# Patient Record
Sex: Male | Born: 1950
Health system: Southern US, Community
[De-identification: ages and names within clinical notes are randomized; demographics above are authoritative.]

## PROBLEM LIST (undated history)

## (undated) DIAGNOSIS — N400 Enlarged prostate without lower urinary tract symptoms: Secondary | ICD-10-CM

## (undated) DIAGNOSIS — I739 Peripheral vascular disease, unspecified: Secondary | ICD-10-CM

## (undated) DIAGNOSIS — R011 Cardiac murmur, unspecified: Secondary | ICD-10-CM

## (undated) DIAGNOSIS — M1990 Unspecified osteoarthritis, unspecified site: Secondary | ICD-10-CM

## (undated) DIAGNOSIS — K8689 Other specified diseases of pancreas: Secondary | ICD-10-CM

## (undated) DIAGNOSIS — R32 Unspecified urinary incontinence: Secondary | ICD-10-CM

## (undated) DIAGNOSIS — G2401 Drug induced subacute dyskinesia: Secondary | ICD-10-CM

## (undated) DIAGNOSIS — I10 Essential (primary) hypertension: Secondary | ICD-10-CM

## (undated) DIAGNOSIS — J309 Allergic rhinitis, unspecified: Secondary | ICD-10-CM

## (undated) DIAGNOSIS — F419 Anxiety disorder, unspecified: Secondary | ICD-10-CM

## (undated) DIAGNOSIS — E119 Type 2 diabetes mellitus without complications: Secondary | ICD-10-CM

## (undated) DIAGNOSIS — F32A Depression, unspecified: Secondary | ICD-10-CM

## (undated) DIAGNOSIS — E039 Hypothyroidism, unspecified: Secondary | ICD-10-CM

## (undated) DIAGNOSIS — F209 Schizophrenia, unspecified: Secondary | ICD-10-CM

## (undated) DIAGNOSIS — F329 Major depressive disorder, single episode, unspecified: Secondary | ICD-10-CM

## (undated) DIAGNOSIS — E785 Hyperlipidemia, unspecified: Secondary | ICD-10-CM

## (undated) DIAGNOSIS — E079 Disorder of thyroid, unspecified: Secondary | ICD-10-CM

## (undated) DIAGNOSIS — G629 Polyneuropathy, unspecified: Secondary | ICD-10-CM

## (undated) DIAGNOSIS — K5909 Other constipation: Secondary | ICD-10-CM

## (undated) DIAGNOSIS — H269 Unspecified cataract: Secondary | ICD-10-CM

## (undated) HISTORY — DX: Unspecified cataract: H26.9

## (undated) HISTORY — PX: TONSILLECTOMY: SUR1361

## (undated) HISTORY — PX: CATARACT EXTRACTION: SUR2

## (undated) HISTORY — PX: EYE SURGERY: SHX253

## (undated) HISTORY — PX: OTHER SURGICAL HISTORY: SHX169

## (undated) HISTORY — PX: COLONOSCOPY: SHX174

## (undated) HISTORY — DX: Unspecified osteoarthritis, unspecified site: M19.90

## (undated) HISTORY — DX: Hyperlipidemia, unspecified: E78.5

## (undated) HISTORY — DX: Cardiac murmur, unspecified: R01.1

---

## 1997-12-22 ENCOUNTER — Inpatient Hospital Stay (HOSPITAL_COMMUNITY): Admission: AD | Admit: 1997-12-22 | Discharge: 1998-01-01 | Payer: Self-pay | Admitting: *Deleted

## 1998-01-06 ENCOUNTER — Emergency Department (HOSPITAL_COMMUNITY): Admission: EM | Admit: 1998-01-06 | Discharge: 1998-01-06 | Payer: Self-pay | Admitting: *Deleted

## 1998-12-29 ENCOUNTER — Encounter: Admission: RE | Admit: 1998-12-29 | Discharge: 1999-03-29 | Payer: Self-pay | Admitting: Endocrinology

## 2000-05-26 ENCOUNTER — Emergency Department (HOSPITAL_COMMUNITY): Admission: EM | Admit: 2000-05-26 | Discharge: 2000-05-26 | Payer: Self-pay | Admitting: Internal Medicine

## 2005-12-26 ENCOUNTER — Inpatient Hospital Stay (HOSPITAL_COMMUNITY): Admission: AD | Admit: 2005-12-26 | Discharge: 2006-01-02 | Payer: Self-pay | Admitting: Psychiatry

## 2005-12-27 ENCOUNTER — Ambulatory Visit: Payer: Self-pay | Admitting: Psychiatry

## 2006-09-01 ENCOUNTER — Emergency Department (HOSPITAL_COMMUNITY): Admission: EM | Admit: 2006-09-01 | Discharge: 2006-09-01 | Payer: Self-pay | Admitting: Emergency Medicine

## 2009-02-01 ENCOUNTER — Emergency Department (HOSPITAL_COMMUNITY): Admission: EM | Admit: 2009-02-01 | Discharge: 2009-02-01 | Payer: Self-pay | Admitting: Emergency Medicine

## 2009-06-04 ENCOUNTER — Ambulatory Visit (HOSPITAL_COMMUNITY): Admission: RE | Admit: 2009-06-04 | Discharge: 2009-06-04 | Payer: Self-pay | Admitting: Endocrinology

## 2010-09-20 ENCOUNTER — Encounter: Payer: Self-pay | Admitting: Endocrinology

## 2010-12-06 LAB — URINALYSIS, ROUTINE W REFLEX MICROSCOPIC
Bilirubin Urine: NEGATIVE
Glucose, UA: >1000 mg/dL — AB
Hgb urine dipstick: NEGATIVE
Leukocytes, UA: NEGATIVE
Specific Gravity, Urine: 1.04 — ABNORMAL HIGH (ref 1.005–1.030)
Urobilinogen, UA: 1 mg/dL (ref 0.0–1.0)

## 2010-12-06 LAB — DIFFERENTIAL
Basophils Relative: 0 % (ref 0–1)
Eosinophils Absolute: 0 10*3/uL (ref 0.0–0.7)
Eosinophils Relative: 0 % (ref 0–5)
Lymphocytes Relative: 26 % (ref 12–46)
Monocytes Relative: 6 % (ref 3–12)

## 2010-12-06 LAB — CBC
HCT: 40.7 % (ref 39.0–52.0)
Platelets: 249 10*3/uL (ref 150–400)
RBC: 4.47 MIL/uL (ref 4.22–5.81)
RDW: 13.7 % (ref 11.5–15.5)
WBC: 9 10*3/uL (ref 4.0–10.5)

## 2010-12-06 LAB — GLUCOSE, CAPILLARY

## 2010-12-06 LAB — COMPREHENSIVE METABOLIC PANEL
GFR calc Af Amer: >60 mL/min (ref >60)
Sodium: 139 mEq/L (ref 135–145)

## 2011-01-14 NOTE — Discharge Summary (Signed)
Ralph [name], KNOBEL NO.:  [id number]   MEDICAL RECORD NO.:  [id number]          PATIENT TYPE:  IPS   LOCATION:  0401                          FACILITY:  [facility]   PHYSICIAN:  Anselm Jungling, MD  DATE OF BIRTH:  July 28, 1951   DATE OF ADMISSION:  12/26/2005  DATE OF DISCHARGE:  01/02/2006                                 DISCHARGE SUMMARY   IDENTIFYING DATA/REASON FOR ADMISSION:  The patient is a 60 year old single  white male with a history of chronic schizophrenia, paranoid-type.  He lives  in a group home in [place].  He was admitted due to increases in paranoid  thinking, delusionality of being poisoned by his group home kitchen,  agitation, and decreased sleep.  He had been on a regimen of psychotropic  medications including Seroquel, Depakote and Paxil but, for reasons that  were not clear, had decompensated.  Please refer to the admission note for  further details pertaining to the symptoms, circumstances and history that  led to his hospitalization.   MEDICAL/LABORATORY:  The patient was medically and physically assessed by  the psychiatric nurse practitioner.  He came to [place] with a variety of chronic  medical problems including hypothyroidism, hypertension, GERD, and type 2  diabetes mellitus.  He came to [place] on nonpsychotropic medications including  Diltiazem SA 180 mg daily, Januvia 100 mg daily, Synthroid 150 mcg daily,  Aciphex 20 mg daily, Altace 5 mg daily, metformin ER 500 mg daily, Lipitor  20 mg daily, Actos 45 mg daily, Amaryl 4 mg b.i.d.  These medications were  continued during his inpatient stay.  Zocor 40 mg daily was added to his  regimen.  The patient remained medically stable throughout his inpatient  stay.   HOSPITAL COURSE:  The patient was admitted to the adult inpatient  psychiatric service.  He presented as a well-nourished, well-developed adult  male who was reasonably well-groomed.  He was alert, fully oriented, but had  an  extremely guarded and hypervigilant manner.  He was standoffish and  aloof, irritable, and secretive.  He stated repeatedly that he wanted no  contact with any persons outside.  He did give me permission to speak with  his outside psychiatrist, but responded to most questions with I'll tell  you that on a need to know basis.  He would not state why he was admitted.  He indicated that he was upset with his group home and did not want to go  back there.  He did not make any overtly delusional statements early on in  his stay but later acknowledged that he had some concern about healthfulness  of food that was provided to him at his group home.   The patient had come to [place] on a regimen of Seroquel 200 mg q.a.m., 400 mg at  noon and 600 mg q.h.s.  In addition, he had been on Paxil and Depakote ER.   Seroquel was adjusted to a final dosage of 100 mg q.a.m., 200 mg q.p.m., and  400 mg q.h.s.  Depakote ER was increased to 1500 mg q.h.s.  Depakote level  was pending at the time of discharge and this dictation.  He was continued  on Paxil 40 mg daily.   The patient improved gradually over several days.  As he became more  comfortable with the __________, he became more relaxed, pleasant, with  brighter affect, and a more friendly demeanor.  He was still somewhat  reluctant to talk about the reasons that led to his admission, but  eventually did meet with the staff from his group home, who came to visit  him, and later indicated that he was comfortable returning there.  Apparently, the patient is a well-liked resident of that program, and  generally does quite well there, but occasionally, perhaps once or twice per  year, his mood destabilizes, and he becomes more irritable, and somewhat  paranoid.  He usually responds fairly well to brief hospitalization, as was  the case this time.   AFTERCARE:  The patient appeared to be appropriate for discharge on the  eighth hospital day.   DISCHARGE  MEDICATIONS:  He was discharged with the following medication  regimen.  1.  Amaryl 4 mg b.i.d.  2.  Actos 45 mg daily.  3.  Zocor 40 mg daily.  4.  Glucophage 500 mg q.a.m.  5.  Altace 5 mg daily.  6.  Protonix 40 mg daily.  7.  Diltiazem 180 mg daily.  8.  Synthroid 150 mcg daily.  9.  Januvia 100 mg daily.  10. Paxil 40 mg daily.  11. Seroquel 100 mg q.a.m., 200 mg q.p.m., and 400 mg q.h.s.  12. Depakote ER 1500 mg q.h.s.   FOLLOW UP:  He was to follow-up with Dr. Donell Beers, his outpatient  psychiatrist on Jan 20, 2006.   DISCHARGE DIAGNOSES:  AXIS I:  Schizophrenia, chronic paranoid, acute  exacerbation, resolving.  AXIS II:  Deferred.  AXIS III:  History of hypertension, type 2 diabetes mellitus,  gastroesophageal reflux disease, hypothyroidism.  AXIS IV:  Stressors:  Severe.  AXIS V:  GAF on discharge 60.           ______________________________  Anselm Jungling, MD  Electronically Signed     SPB/MEDQ  D:  01/03/2006  T:  01/04/2006  Job:  [id number]

## 2011-09-08 DIAGNOSIS — F205 Residual schizophrenia: Secondary | ICD-10-CM | POA: Diagnosis not present

## 2011-09-12 DIAGNOSIS — E789 Disorder of lipoprotein metabolism, unspecified: Secondary | ICD-10-CM | POA: Diagnosis not present

## 2011-09-12 DIAGNOSIS — E0789 Other specified disorders of thyroid: Secondary | ICD-10-CM | POA: Diagnosis not present

## 2011-09-12 DIAGNOSIS — Z23 Encounter for immunization: Secondary | ICD-10-CM | POA: Diagnosis not present

## 2011-09-12 DIAGNOSIS — E119 Type 2 diabetes mellitus without complications: Secondary | ICD-10-CM | POA: Diagnosis not present

## 2011-11-16 DIAGNOSIS — F205 Residual schizophrenia: Secondary | ICD-10-CM | POA: Diagnosis not present

## 2011-11-28 DIAGNOSIS — F205 Residual schizophrenia: Secondary | ICD-10-CM | POA: Diagnosis not present

## 2011-12-05 DIAGNOSIS — F205 Residual schizophrenia: Secondary | ICD-10-CM | POA: Diagnosis not present

## 2011-12-12 DIAGNOSIS — F205 Residual schizophrenia: Secondary | ICD-10-CM | POA: Diagnosis not present

## 2011-12-19 DIAGNOSIS — F205 Residual schizophrenia: Secondary | ICD-10-CM | POA: Diagnosis not present

## 2012-01-06 DIAGNOSIS — H10029 Other mucopurulent conjunctivitis, unspecified eye: Secondary | ICD-10-CM | POA: Diagnosis not present

## 2012-01-06 DIAGNOSIS — H16219 Exposure keratoconjunctivitis, unspecified eye: Secondary | ICD-10-CM | POA: Diagnosis not present

## 2012-01-09 DIAGNOSIS — H10029 Other mucopurulent conjunctivitis, unspecified eye: Secondary | ICD-10-CM | POA: Diagnosis not present

## 2012-01-09 DIAGNOSIS — H01009 Unspecified blepharitis unspecified eye, unspecified eyelid: Secondary | ICD-10-CM | POA: Diagnosis not present

## 2012-01-09 DIAGNOSIS — H02209 Unspecified lagophthalmos unspecified eye, unspecified eyelid: Secondary | ICD-10-CM | POA: Diagnosis not present

## 2012-01-09 DIAGNOSIS — H179 Unspecified corneal scar and opacity: Secondary | ICD-10-CM | POA: Diagnosis not present

## 2012-01-10 DIAGNOSIS — E789 Disorder of lipoprotein metabolism, unspecified: Secondary | ICD-10-CM | POA: Diagnosis not present

## 2012-01-10 DIAGNOSIS — E119 Type 2 diabetes mellitus without complications: Secondary | ICD-10-CM | POA: Diagnosis not present

## 2012-01-10 DIAGNOSIS — I10 Essential (primary) hypertension: Secondary | ICD-10-CM | POA: Diagnosis not present

## 2012-01-10 DIAGNOSIS — E0789 Other specified disorders of thyroid: Secondary | ICD-10-CM | POA: Diagnosis not present

## 2012-01-12 DIAGNOSIS — H10029 Other mucopurulent conjunctivitis, unspecified eye: Secondary | ICD-10-CM | POA: Diagnosis not present

## 2012-01-12 DIAGNOSIS — H02209 Unspecified lagophthalmos unspecified eye, unspecified eyelid: Secondary | ICD-10-CM | POA: Diagnosis not present

## 2012-01-12 DIAGNOSIS — H179 Unspecified corneal scar and opacity: Secondary | ICD-10-CM | POA: Diagnosis not present

## 2012-01-12 DIAGNOSIS — H16409 Unspecified corneal neovascularization, unspecified eye: Secondary | ICD-10-CM | POA: Diagnosis not present

## 2012-01-16 DIAGNOSIS — F205 Residual schizophrenia: Secondary | ICD-10-CM | POA: Diagnosis not present

## 2012-01-18 DIAGNOSIS — H179 Unspecified corneal scar and opacity: Secondary | ICD-10-CM | POA: Diagnosis not present

## 2012-01-18 DIAGNOSIS — Z961 Presence of intraocular lens: Secondary | ICD-10-CM | POA: Diagnosis not present

## 2012-01-18 DIAGNOSIS — H10029 Other mucopurulent conjunctivitis, unspecified eye: Secondary | ICD-10-CM | POA: Diagnosis not present

## 2012-01-18 DIAGNOSIS — H02209 Unspecified lagophthalmos unspecified eye, unspecified eyelid: Secondary | ICD-10-CM | POA: Diagnosis not present

## 2012-02-13 DIAGNOSIS — F205 Residual schizophrenia: Secondary | ICD-10-CM | POA: Diagnosis not present

## 2012-02-16 DIAGNOSIS — E0789 Other specified disorders of thyroid: Secondary | ICD-10-CM | POA: Diagnosis not present

## 2012-02-16 DIAGNOSIS — F205 Residual schizophrenia: Secondary | ICD-10-CM | POA: Diagnosis not present

## 2012-02-16 DIAGNOSIS — E119 Type 2 diabetes mellitus without complications: Secondary | ICD-10-CM | POA: Diagnosis not present

## 2012-02-16 DIAGNOSIS — E789 Disorder of lipoprotein metabolism, unspecified: Secondary | ICD-10-CM | POA: Diagnosis not present

## 2012-04-03 DIAGNOSIS — Z125 Encounter for screening for malignant neoplasm of prostate: Secondary | ICD-10-CM | POA: Diagnosis not present

## 2012-04-03 DIAGNOSIS — E0789 Other specified disorders of thyroid: Secondary | ICD-10-CM | POA: Diagnosis not present

## 2012-04-03 DIAGNOSIS — E789 Disorder of lipoprotein metabolism, unspecified: Secondary | ICD-10-CM | POA: Diagnosis not present

## 2012-04-03 DIAGNOSIS — E119 Type 2 diabetes mellitus without complications: Secondary | ICD-10-CM | POA: Diagnosis not present

## 2012-04-03 DIAGNOSIS — N390 Urinary tract infection, site not specified: Secondary | ICD-10-CM | POA: Diagnosis not present

## 2012-04-16 DIAGNOSIS — Z0000 Encounter for general adult medical examination without abnormal findings: Secondary | ICD-10-CM | POA: Diagnosis not present

## 2012-04-16 DIAGNOSIS — I700 Atherosclerosis of aorta: Secondary | ICD-10-CM | POA: Diagnosis not present

## 2012-04-16 DIAGNOSIS — E789 Disorder of lipoprotein metabolism, unspecified: Secondary | ICD-10-CM | POA: Diagnosis not present

## 2012-04-16 DIAGNOSIS — I10 Essential (primary) hypertension: Secondary | ICD-10-CM | POA: Diagnosis not present

## 2012-04-16 DIAGNOSIS — R51 Headache: Secondary | ICD-10-CM | POA: Diagnosis not present

## 2012-04-16 DIAGNOSIS — E119 Type 2 diabetes mellitus without complications: Secondary | ICD-10-CM | POA: Diagnosis not present

## 2012-05-22 DIAGNOSIS — H179 Unspecified corneal scar and opacity: Secondary | ICD-10-CM | POA: Diagnosis not present

## 2012-05-22 DIAGNOSIS — H16219 Exposure keratoconjunctivitis, unspecified eye: Secondary | ICD-10-CM | POA: Diagnosis not present

## 2012-05-22 DIAGNOSIS — E0500 Thyrotoxicosis with diffuse goiter without thyrotoxic crisis or storm: Secondary | ICD-10-CM | POA: Diagnosis not present

## 2012-05-22 DIAGNOSIS — H02109 Unspecified ectropion of unspecified eye, unspecified eyelid: Secondary | ICD-10-CM | POA: Diagnosis not present

## 2012-05-22 DIAGNOSIS — H02539 Eyelid retraction unspecified eye, unspecified lid: Secondary | ICD-10-CM | POA: Diagnosis not present

## 2012-07-09 DIAGNOSIS — Z23 Encounter for immunization: Secondary | ICD-10-CM | POA: Diagnosis not present

## 2012-08-10 DIAGNOSIS — E789 Disorder of lipoprotein metabolism, unspecified: Secondary | ICD-10-CM | POA: Diagnosis not present

## 2012-08-10 DIAGNOSIS — E119 Type 2 diabetes mellitus without complications: Secondary | ICD-10-CM | POA: Diagnosis not present

## 2012-08-16 DIAGNOSIS — E119 Type 2 diabetes mellitus without complications: Secondary | ICD-10-CM | POA: Diagnosis not present

## 2012-08-16 DIAGNOSIS — E789 Disorder of lipoprotein metabolism, unspecified: Secondary | ICD-10-CM | POA: Diagnosis not present

## 2012-08-16 DIAGNOSIS — E0789 Other specified disorders of thyroid: Secondary | ICD-10-CM | POA: Diagnosis not present

## 2012-08-16 DIAGNOSIS — I10 Essential (primary) hypertension: Secondary | ICD-10-CM | POA: Diagnosis not present

## 2012-08-24 DIAGNOSIS — F205 Residual schizophrenia: Secondary | ICD-10-CM | POA: Diagnosis not present

## 2012-09-07 DIAGNOSIS — E119 Type 2 diabetes mellitus without complications: Secondary | ICD-10-CM | POA: Diagnosis not present

## 2012-09-17 DIAGNOSIS — E0789 Other specified disorders of thyroid: Secondary | ICD-10-CM | POA: Diagnosis not present

## 2012-09-17 DIAGNOSIS — E119 Type 2 diabetes mellitus without complications: Secondary | ICD-10-CM | POA: Diagnosis not present

## 2012-09-24 DIAGNOSIS — N400 Enlarged prostate without lower urinary tract symptoms: Secondary | ICD-10-CM | POA: Diagnosis not present

## 2012-09-24 DIAGNOSIS — N3941 Urge incontinence: Secondary | ICD-10-CM | POA: Diagnosis not present

## 2012-09-24 DIAGNOSIS — N433 Hydrocele, unspecified: Secondary | ICD-10-CM | POA: Diagnosis not present

## 2012-10-16 DIAGNOSIS — E119 Type 2 diabetes mellitus without complications: Secondary | ICD-10-CM | POA: Diagnosis not present

## 2012-10-23 DIAGNOSIS — E0789 Other specified disorders of thyroid: Secondary | ICD-10-CM | POA: Diagnosis not present

## 2012-10-23 DIAGNOSIS — E119 Type 2 diabetes mellitus without complications: Secondary | ICD-10-CM | POA: Diagnosis not present

## 2012-10-23 DIAGNOSIS — E789 Disorder of lipoprotein metabolism, unspecified: Secondary | ICD-10-CM | POA: Diagnosis not present

## 2012-11-07 DIAGNOSIS — N3941 Urge incontinence: Secondary | ICD-10-CM | POA: Diagnosis not present

## 2012-11-07 DIAGNOSIS — R972 Elevated prostate specific antigen [PSA]: Secondary | ICD-10-CM | POA: Diagnosis not present

## 2012-11-07 DIAGNOSIS — R3915 Urgency of urination: Secondary | ICD-10-CM | POA: Diagnosis not present

## 2012-11-07 DIAGNOSIS — N400 Enlarged prostate without lower urinary tract symptoms: Secondary | ICD-10-CM | POA: Diagnosis not present

## 2012-11-22 DIAGNOSIS — F205 Residual schizophrenia: Secondary | ICD-10-CM | POA: Diagnosis not present

## 2012-11-22 DIAGNOSIS — R141 Gas pain: Secondary | ICD-10-CM | POA: Diagnosis not present

## 2012-11-23 DIAGNOSIS — E119 Type 2 diabetes mellitus without complications: Secondary | ICD-10-CM | POA: Diagnosis not present

## 2012-11-29 DIAGNOSIS — E119 Type 2 diabetes mellitus without complications: Secondary | ICD-10-CM | POA: Diagnosis not present

## 2012-11-29 DIAGNOSIS — E0789 Other specified disorders of thyroid: Secondary | ICD-10-CM | POA: Diagnosis not present

## 2012-11-29 DIAGNOSIS — I10 Essential (primary) hypertension: Secondary | ICD-10-CM | POA: Diagnosis not present

## 2013-01-15 DIAGNOSIS — R143 Flatulence: Secondary | ICD-10-CM | POA: Diagnosis not present

## 2013-01-15 DIAGNOSIS — R141 Gas pain: Secondary | ICD-10-CM | POA: Diagnosis not present

## 2013-01-18 DIAGNOSIS — F209 Schizophrenia, unspecified: Secondary | ICD-10-CM | POA: Diagnosis not present

## 2013-01-28 DIAGNOSIS — F209 Schizophrenia, unspecified: Secondary | ICD-10-CM | POA: Diagnosis not present

## 2013-02-01 DIAGNOSIS — F205 Residual schizophrenia: Secondary | ICD-10-CM | POA: Diagnosis not present

## 2013-02-06 DIAGNOSIS — E119 Type 2 diabetes mellitus without complications: Secondary | ICD-10-CM | POA: Diagnosis not present

## 2013-02-13 DIAGNOSIS — E119 Type 2 diabetes mellitus without complications: Secondary | ICD-10-CM | POA: Diagnosis not present

## 2013-02-13 DIAGNOSIS — E0789 Other specified disorders of thyroid: Secondary | ICD-10-CM | POA: Diagnosis not present

## 2013-02-13 DIAGNOSIS — G589 Mononeuropathy, unspecified: Secondary | ICD-10-CM | POA: Diagnosis not present

## 2013-03-07 DIAGNOSIS — E789 Disorder of lipoprotein metabolism, unspecified: Secondary | ICD-10-CM | POA: Diagnosis not present

## 2013-03-07 DIAGNOSIS — G589 Mononeuropathy, unspecified: Secondary | ICD-10-CM | POA: Diagnosis not present

## 2013-03-07 DIAGNOSIS — E119 Type 2 diabetes mellitus without complications: Secondary | ICD-10-CM | POA: Diagnosis not present

## 2013-03-25 DIAGNOSIS — E119 Type 2 diabetes mellitus without complications: Secondary | ICD-10-CM | POA: Diagnosis not present

## 2013-04-04 DIAGNOSIS — E119 Type 2 diabetes mellitus without complications: Secondary | ICD-10-CM | POA: Diagnosis not present

## 2013-04-04 DIAGNOSIS — E789 Disorder of lipoprotein metabolism, unspecified: Secondary | ICD-10-CM | POA: Diagnosis not present

## 2013-04-04 DIAGNOSIS — G589 Mononeuropathy, unspecified: Secondary | ICD-10-CM | POA: Diagnosis not present

## 2013-04-04 DIAGNOSIS — I10 Essential (primary) hypertension: Secondary | ICD-10-CM | POA: Diagnosis not present

## 2013-04-09 DIAGNOSIS — F205 Residual schizophrenia: Secondary | ICD-10-CM | POA: Diagnosis not present

## 2013-05-17 DIAGNOSIS — Z125 Encounter for screening for malignant neoplasm of prostate: Secondary | ICD-10-CM | POA: Diagnosis not present

## 2013-05-17 DIAGNOSIS — E0789 Other specified disorders of thyroid: Secondary | ICD-10-CM | POA: Diagnosis not present

## 2013-05-17 DIAGNOSIS — E119 Type 2 diabetes mellitus without complications: Secondary | ICD-10-CM | POA: Diagnosis not present

## 2013-05-17 DIAGNOSIS — E789 Disorder of lipoprotein metabolism, unspecified: Secondary | ICD-10-CM | POA: Diagnosis not present

## 2013-05-30 DIAGNOSIS — E0500 Thyrotoxicosis with diffuse goiter without thyrotoxic crisis or storm: Secondary | ICD-10-CM | POA: Diagnosis not present

## 2013-05-30 DIAGNOSIS — G589 Mononeuropathy, unspecified: Secondary | ICD-10-CM | POA: Diagnosis not present

## 2013-05-30 DIAGNOSIS — Z23 Encounter for immunization: Secondary | ICD-10-CM | POA: Diagnosis not present

## 2013-05-30 DIAGNOSIS — E119 Type 2 diabetes mellitus without complications: Secondary | ICD-10-CM | POA: Diagnosis not present

## 2013-06-03 DIAGNOSIS — R972 Elevated prostate specific antigen [PSA]: Secondary | ICD-10-CM | POA: Diagnosis not present

## 2013-06-07 DIAGNOSIS — N400 Enlarged prostate without lower urinary tract symptoms: Secondary | ICD-10-CM | POA: Diagnosis not present

## 2013-07-02 DIAGNOSIS — E119 Type 2 diabetes mellitus without complications: Secondary | ICD-10-CM | POA: Diagnosis not present

## 2013-07-03 DIAGNOSIS — E119 Type 2 diabetes mellitus without complications: Secondary | ICD-10-CM | POA: Diagnosis not present

## 2013-07-04 DIAGNOSIS — E119 Type 2 diabetes mellitus without complications: Secondary | ICD-10-CM | POA: Diagnosis not present

## 2013-07-04 DIAGNOSIS — E789 Disorder of lipoprotein metabolism, unspecified: Secondary | ICD-10-CM | POA: Diagnosis not present

## 2013-07-04 DIAGNOSIS — I10 Essential (primary) hypertension: Secondary | ICD-10-CM | POA: Diagnosis not present

## 2013-07-24 DIAGNOSIS — N059 Unspecified nephritic syndrome with unspecified morphologic changes: Secondary | ICD-10-CM | POA: Diagnosis not present

## 2013-07-24 DIAGNOSIS — E119 Type 2 diabetes mellitus without complications: Secondary | ICD-10-CM | POA: Diagnosis not present

## 2013-08-15 DIAGNOSIS — F205 Residual schizophrenia: Secondary | ICD-10-CM | POA: Diagnosis not present

## 2013-09-04 DIAGNOSIS — I10 Essential (primary) hypertension: Secondary | ICD-10-CM | POA: Diagnosis not present

## 2013-09-04 DIAGNOSIS — E789 Disorder of lipoprotein metabolism, unspecified: Secondary | ICD-10-CM | POA: Diagnosis not present

## 2013-09-04 DIAGNOSIS — E119 Type 2 diabetes mellitus without complications: Secondary | ICD-10-CM | POA: Diagnosis not present

## 2013-11-06 DIAGNOSIS — E789 Disorder of lipoprotein metabolism, unspecified: Secondary | ICD-10-CM | POA: Diagnosis not present

## 2013-11-06 DIAGNOSIS — E119 Type 2 diabetes mellitus without complications: Secondary | ICD-10-CM | POA: Diagnosis not present

## 2013-11-21 DIAGNOSIS — E119 Type 2 diabetes mellitus without complications: Secondary | ICD-10-CM | POA: Diagnosis not present

## 2013-11-21 DIAGNOSIS — E789 Disorder of lipoprotein metabolism, unspecified: Secondary | ICD-10-CM | POA: Diagnosis not present

## 2013-11-21 DIAGNOSIS — E0789 Other specified disorders of thyroid: Secondary | ICD-10-CM | POA: Diagnosis not present

## 2013-11-21 DIAGNOSIS — N059 Unspecified nephritic syndrome with unspecified morphologic changes: Secondary | ICD-10-CM | POA: Diagnosis not present

## 2013-11-22 DIAGNOSIS — F205 Residual schizophrenia: Secondary | ICD-10-CM | POA: Diagnosis not present

## 2013-12-11 DIAGNOSIS — F205 Residual schizophrenia: Secondary | ICD-10-CM | POA: Diagnosis not present

## 2014-02-20 DIAGNOSIS — E119 Type 2 diabetes mellitus without complications: Secondary | ICD-10-CM | POA: Diagnosis not present

## 2014-02-20 DIAGNOSIS — E0789 Other specified disorders of thyroid: Secondary | ICD-10-CM | POA: Diagnosis not present

## 2014-02-20 DIAGNOSIS — I10 Essential (primary) hypertension: Secondary | ICD-10-CM | POA: Diagnosis not present

## 2014-03-13 DIAGNOSIS — F205 Residual schizophrenia: Secondary | ICD-10-CM | POA: Diagnosis not present

## 2014-06-13 DIAGNOSIS — E119 Type 2 diabetes mellitus without complications: Secondary | ICD-10-CM | POA: Diagnosis not present

## 2014-06-26 DIAGNOSIS — E789 Disorder of lipoprotein metabolism, unspecified: Secondary | ICD-10-CM | POA: Diagnosis not present

## 2014-06-26 DIAGNOSIS — Z23 Encounter for immunization: Secondary | ICD-10-CM | POA: Diagnosis not present

## 2014-06-26 DIAGNOSIS — E032 Hypothyroidism due to medicaments and other exogenous substances: Secondary | ICD-10-CM | POA: Diagnosis not present

## 2014-06-26 DIAGNOSIS — E119 Type 2 diabetes mellitus without complications: Secondary | ICD-10-CM | POA: Diagnosis not present

## 2014-07-21 DIAGNOSIS — Z0000 Encounter for general adult medical examination without abnormal findings: Secondary | ICD-10-CM | POA: Diagnosis not present

## 2014-09-17 DIAGNOSIS — E0789 Other specified disorders of thyroid: Secondary | ICD-10-CM | POA: Diagnosis not present

## 2014-09-17 DIAGNOSIS — Z79899 Other long term (current) drug therapy: Secondary | ICD-10-CM | POA: Diagnosis not present

## 2014-09-17 DIAGNOSIS — E789 Disorder of lipoprotein metabolism, unspecified: Secondary | ICD-10-CM | POA: Diagnosis not present

## 2014-09-17 DIAGNOSIS — Z125 Encounter for screening for malignant neoplasm of prostate: Secondary | ICD-10-CM | POA: Diagnosis not present

## 2014-09-17 DIAGNOSIS — E118 Type 2 diabetes mellitus with unspecified complications: Secondary | ICD-10-CM | POA: Diagnosis not present

## 2014-10-01 DIAGNOSIS — E118 Type 2 diabetes mellitus with unspecified complications: Secondary | ICD-10-CM | POA: Diagnosis not present

## 2014-10-01 DIAGNOSIS — E0500 Thyrotoxicosis with diffuse goiter without thyrotoxic crisis or storm: Secondary | ICD-10-CM | POA: Diagnosis not present

## 2014-10-01 DIAGNOSIS — N400 Enlarged prostate without lower urinary tract symptoms: Secondary | ICD-10-CM | POA: Diagnosis not present

## 2014-11-04 DIAGNOSIS — Z961 Presence of intraocular lens: Secondary | ICD-10-CM | POA: Diagnosis not present

## 2014-11-04 DIAGNOSIS — H04123 Dry eye syndrome of bilateral lacrimal glands: Secondary | ICD-10-CM | POA: Diagnosis not present

## 2014-11-04 DIAGNOSIS — E119 Type 2 diabetes mellitus without complications: Secondary | ICD-10-CM | POA: Diagnosis not present

## 2014-11-04 DIAGNOSIS — H26492 Other secondary cataract, left eye: Secondary | ICD-10-CM | POA: Diagnosis not present

## 2014-11-04 DIAGNOSIS — H17823 Peripheral opacity of cornea, bilateral: Secondary | ICD-10-CM | POA: Diagnosis not present

## 2014-11-04 DIAGNOSIS — H0589 Other disorders of orbit: Secondary | ICD-10-CM | POA: Diagnosis not present

## 2014-11-05 DIAGNOSIS — N433 Hydrocele, unspecified: Secondary | ICD-10-CM | POA: Diagnosis not present

## 2014-11-05 DIAGNOSIS — R972 Elevated prostate specific antigen [PSA]: Secondary | ICD-10-CM | POA: Diagnosis not present

## 2014-11-17 DIAGNOSIS — F205 Residual schizophrenia: Secondary | ICD-10-CM | POA: Diagnosis not present

## 2014-11-18 DIAGNOSIS — Z961 Presence of intraocular lens: Secondary | ICD-10-CM | POA: Diagnosis not present

## 2014-11-18 DIAGNOSIS — H26492 Other secondary cataract, left eye: Secondary | ICD-10-CM | POA: Diagnosis not present

## 2014-12-15 DIAGNOSIS — I10 Essential (primary) hypertension: Secondary | ICD-10-CM | POA: Diagnosis not present

## 2014-12-15 DIAGNOSIS — G40909 Epilepsy, unspecified, not intractable, without status epilepticus: Secondary | ICD-10-CM | POA: Diagnosis not present

## 2014-12-15 DIAGNOSIS — N183 Chronic kidney disease, stage 3 (moderate): Secondary | ICD-10-CM | POA: Diagnosis not present

## 2014-12-24 DIAGNOSIS — E118 Type 2 diabetes mellitus with unspecified complications: Secondary | ICD-10-CM | POA: Diagnosis not present

## 2014-12-24 DIAGNOSIS — Z79899 Other long term (current) drug therapy: Secondary | ICD-10-CM | POA: Diagnosis not present

## 2014-12-24 DIAGNOSIS — E789 Disorder of lipoprotein metabolism, unspecified: Secondary | ICD-10-CM | POA: Diagnosis not present

## 2014-12-24 DIAGNOSIS — E0789 Other specified disorders of thyroid: Secondary | ICD-10-CM | POA: Diagnosis not present

## 2015-01-19 DIAGNOSIS — F205 Residual schizophrenia: Secondary | ICD-10-CM | POA: Diagnosis not present

## 2015-03-17 DIAGNOSIS — R972 Elevated prostate specific antigen [PSA]: Secondary | ICD-10-CM | POA: Diagnosis not present

## 2015-03-30 DIAGNOSIS — N138 Other obstructive and reflux uropathy: Secondary | ICD-10-CM | POA: Diagnosis not present

## 2015-03-30 DIAGNOSIS — R972 Elevated prostate specific antigen [PSA]: Secondary | ICD-10-CM | POA: Diagnosis not present

## 2015-03-30 DIAGNOSIS — N433 Hydrocele, unspecified: Secondary | ICD-10-CM | POA: Diagnosis not present

## 2015-03-30 DIAGNOSIS — R339 Retention of urine, unspecified: Secondary | ICD-10-CM | POA: Diagnosis not present

## 2015-03-30 DIAGNOSIS — N3941 Urge incontinence: Secondary | ICD-10-CM | POA: Diagnosis not present

## 2015-04-01 DIAGNOSIS — E118 Type 2 diabetes mellitus with unspecified complications: Secondary | ICD-10-CM | POA: Diagnosis not present

## 2015-04-08 DIAGNOSIS — E789 Disorder of lipoprotein metabolism, unspecified: Secondary | ICD-10-CM | POA: Diagnosis not present

## 2015-04-08 DIAGNOSIS — E0500 Thyrotoxicosis with diffuse goiter without thyrotoxic crisis or storm: Secondary | ICD-10-CM | POA: Diagnosis not present

## 2015-04-08 DIAGNOSIS — E118 Type 2 diabetes mellitus with unspecified complications: Secondary | ICD-10-CM | POA: Diagnosis not present

## 2015-06-18 DIAGNOSIS — F205 Residual schizophrenia: Secondary | ICD-10-CM | POA: Diagnosis not present

## 2015-06-24 DIAGNOSIS — R972 Elevated prostate specific antigen [PSA]: Secondary | ICD-10-CM | POA: Diagnosis not present

## 2015-06-26 DIAGNOSIS — N183 Chronic kidney disease, stage 3 (moderate): Secondary | ICD-10-CM | POA: Diagnosis not present

## 2015-06-26 DIAGNOSIS — G40909 Epilepsy, unspecified, not intractable, without status epilepticus: Secondary | ICD-10-CM | POA: Diagnosis not present

## 2015-07-01 DIAGNOSIS — N401 Enlarged prostate with lower urinary tract symptoms: Secondary | ICD-10-CM | POA: Diagnosis not present

## 2015-07-01 DIAGNOSIS — R972 Elevated prostate specific antigen [PSA]: Secondary | ICD-10-CM | POA: Diagnosis not present

## 2015-07-01 DIAGNOSIS — N433 Hydrocele, unspecified: Secondary | ICD-10-CM | POA: Diagnosis not present

## 2015-07-01 DIAGNOSIS — N138 Other obstructive and reflux uropathy: Secondary | ICD-10-CM | POA: Diagnosis not present

## 2015-07-02 DIAGNOSIS — F200 Paranoid schizophrenia: Secondary | ICD-10-CM | POA: Diagnosis not present

## 2015-07-02 DIAGNOSIS — F329 Major depressive disorder, single episode, unspecified: Secondary | ICD-10-CM | POA: Diagnosis not present

## 2015-07-15 DIAGNOSIS — F200 Paranoid schizophrenia: Secondary | ICD-10-CM | POA: Diagnosis not present

## 2015-07-15 DIAGNOSIS — F329 Major depressive disorder, single episode, unspecified: Secondary | ICD-10-CM | POA: Diagnosis not present

## 2015-07-17 DIAGNOSIS — Z23 Encounter for immunization: Secondary | ICD-10-CM | POA: Diagnosis not present

## 2015-07-29 DIAGNOSIS — F329 Major depressive disorder, single episode, unspecified: Secondary | ICD-10-CM | POA: Diagnosis not present

## 2015-07-29 DIAGNOSIS — F200 Paranoid schizophrenia: Secondary | ICD-10-CM | POA: Diagnosis not present

## 2015-08-03 DIAGNOSIS — R972 Elevated prostate specific antigen [PSA]: Secondary | ICD-10-CM | POA: Diagnosis not present

## 2015-08-03 DIAGNOSIS — N433 Hydrocele, unspecified: Secondary | ICD-10-CM | POA: Diagnosis not present

## 2015-08-03 DIAGNOSIS — N138 Other obstructive and reflux uropathy: Secondary | ICD-10-CM | POA: Diagnosis not present

## 2015-08-03 DIAGNOSIS — N281 Cyst of kidney, acquired: Secondary | ICD-10-CM | POA: Diagnosis not present

## 2015-08-03 DIAGNOSIS — N401 Enlarged prostate with lower urinary tract symptoms: Secondary | ICD-10-CM | POA: Diagnosis not present

## 2015-08-07 DIAGNOSIS — F329 Major depressive disorder, single episode, unspecified: Secondary | ICD-10-CM | POA: Diagnosis not present

## 2015-08-07 DIAGNOSIS — F200 Paranoid schizophrenia: Secondary | ICD-10-CM | POA: Diagnosis not present

## 2015-08-11 DIAGNOSIS — F200 Paranoid schizophrenia: Secondary | ICD-10-CM | POA: Diagnosis not present

## 2015-08-11 DIAGNOSIS — F329 Major depressive disorder, single episode, unspecified: Secondary | ICD-10-CM | POA: Diagnosis not present

## 2015-08-27 DIAGNOSIS — F329 Major depressive disorder, single episode, unspecified: Secondary | ICD-10-CM | POA: Diagnosis not present

## 2015-08-27 DIAGNOSIS — M79675 Pain in left toe(s): Secondary | ICD-10-CM | POA: Diagnosis not present

## 2015-08-27 DIAGNOSIS — B351 Tinea unguium: Secondary | ICD-10-CM | POA: Diagnosis not present

## 2015-08-27 DIAGNOSIS — M79674 Pain in right toe(s): Secondary | ICD-10-CM | POA: Diagnosis not present

## 2015-08-27 DIAGNOSIS — F200 Paranoid schizophrenia: Secondary | ICD-10-CM | POA: Diagnosis not present

## 2015-09-09 DIAGNOSIS — J329 Chronic sinusitis, unspecified: Secondary | ICD-10-CM | POA: Diagnosis not present

## 2015-09-09 DIAGNOSIS — E118 Type 2 diabetes mellitus with unspecified complications: Secondary | ICD-10-CM | POA: Diagnosis not present

## 2015-09-16 DIAGNOSIS — F200 Paranoid schizophrenia: Secondary | ICD-10-CM | POA: Diagnosis not present

## 2015-09-16 DIAGNOSIS — F329 Major depressive disorder, single episode, unspecified: Secondary | ICD-10-CM | POA: Diagnosis not present

## 2015-09-17 DIAGNOSIS — F205 Residual schizophrenia: Secondary | ICD-10-CM | POA: Diagnosis not present

## 2015-09-21 DIAGNOSIS — G40909 Epilepsy, unspecified, not intractable, without status epilepticus: Secondary | ICD-10-CM | POA: Diagnosis not present

## 2015-09-21 DIAGNOSIS — N183 Chronic kidney disease, stage 3 (moderate): Secondary | ICD-10-CM | POA: Diagnosis not present

## 2015-09-23 DIAGNOSIS — F329 Major depressive disorder, single episode, unspecified: Secondary | ICD-10-CM | POA: Diagnosis not present

## 2015-09-23 DIAGNOSIS — F200 Paranoid schizophrenia: Secondary | ICD-10-CM | POA: Diagnosis not present

## 2015-10-02 DIAGNOSIS — E119 Type 2 diabetes mellitus without complications: Secondary | ICD-10-CM | POA: Diagnosis not present

## 2015-10-02 DIAGNOSIS — Z125 Encounter for screening for malignant neoplasm of prostate: Secondary | ICD-10-CM | POA: Diagnosis not present

## 2015-10-02 DIAGNOSIS — E0789 Other specified disorders of thyroid: Secondary | ICD-10-CM | POA: Diagnosis not present

## 2015-10-02 DIAGNOSIS — E118 Type 2 diabetes mellitus with unspecified complications: Secondary | ICD-10-CM | POA: Diagnosis not present

## 2015-10-06 DIAGNOSIS — F200 Paranoid schizophrenia: Secondary | ICD-10-CM | POA: Diagnosis not present

## 2015-10-06 DIAGNOSIS — F329 Major depressive disorder, single episode, unspecified: Secondary | ICD-10-CM | POA: Diagnosis not present

## 2015-10-14 DIAGNOSIS — F200 Paranoid schizophrenia: Secondary | ICD-10-CM | POA: Diagnosis not present

## 2015-10-14 DIAGNOSIS — F329 Major depressive disorder, single episode, unspecified: Secondary | ICD-10-CM | POA: Diagnosis not present

## 2015-10-22 DIAGNOSIS — E039 Hypothyroidism, unspecified: Secondary | ICD-10-CM | POA: Diagnosis not present

## 2015-10-22 DIAGNOSIS — E032 Hypothyroidism due to medicaments and other exogenous substances: Secondary | ICD-10-CM | POA: Diagnosis not present

## 2015-10-22 DIAGNOSIS — E118 Type 2 diabetes mellitus with unspecified complications: Secondary | ICD-10-CM | POA: Diagnosis not present

## 2015-10-22 DIAGNOSIS — N400 Enlarged prostate without lower urinary tract symptoms: Secondary | ICD-10-CM | POA: Diagnosis not present

## 2015-10-28 DIAGNOSIS — F200 Paranoid schizophrenia: Secondary | ICD-10-CM | POA: Diagnosis not present

## 2015-10-28 DIAGNOSIS — F329 Major depressive disorder, single episode, unspecified: Secondary | ICD-10-CM | POA: Diagnosis not present

## 2015-11-02 DIAGNOSIS — R05 Cough: Secondary | ICD-10-CM | POA: Diagnosis not present

## 2015-11-18 DIAGNOSIS — F200 Paranoid schizophrenia: Secondary | ICD-10-CM | POA: Diagnosis not present

## 2015-11-18 DIAGNOSIS — F329 Major depressive disorder, single episode, unspecified: Secondary | ICD-10-CM | POA: Diagnosis not present

## 2015-12-01 DIAGNOSIS — E119 Type 2 diabetes mellitus without complications: Secondary | ICD-10-CM | POA: Diagnosis not present

## 2015-12-01 DIAGNOSIS — E0500 Thyrotoxicosis with diffuse goiter without thyrotoxic crisis or storm: Secondary | ICD-10-CM | POA: Diagnosis not present

## 2015-12-01 DIAGNOSIS — H04123 Dry eye syndrome of bilateral lacrimal glands: Secondary | ICD-10-CM | POA: Diagnosis not present

## 2015-12-01 DIAGNOSIS — H17823 Peripheral opacity of cornea, bilateral: Secondary | ICD-10-CM | POA: Diagnosis not present

## 2015-12-01 DIAGNOSIS — Z961 Presence of intraocular lens: Secondary | ICD-10-CM | POA: Diagnosis not present

## 2015-12-02 DIAGNOSIS — F329 Major depressive disorder, single episode, unspecified: Secondary | ICD-10-CM | POA: Diagnosis not present

## 2015-12-02 DIAGNOSIS — F200 Paranoid schizophrenia: Secondary | ICD-10-CM | POA: Diagnosis not present

## 2015-12-16 DIAGNOSIS — M542 Cervicalgia: Secondary | ICD-10-CM | POA: Diagnosis not present

## 2015-12-16 DIAGNOSIS — J329 Chronic sinusitis, unspecified: Secondary | ICD-10-CM | POA: Diagnosis not present

## 2015-12-16 DIAGNOSIS — E118 Type 2 diabetes mellitus with unspecified complications: Secondary | ICD-10-CM | POA: Diagnosis not present

## 2015-12-16 DIAGNOSIS — M5030 Other cervical disc degeneration, unspecified cervical region: Secondary | ICD-10-CM | POA: Diagnosis not present

## 2015-12-17 DIAGNOSIS — F205 Residual schizophrenia: Secondary | ICD-10-CM | POA: Diagnosis not present

## 2015-12-29 DIAGNOSIS — Z79899 Other long term (current) drug therapy: Secondary | ICD-10-CM | POA: Diagnosis not present

## 2016-01-21 DIAGNOSIS — Z0000 Encounter for general adult medical examination without abnormal findings: Secondary | ICD-10-CM | POA: Diagnosis not present

## 2016-01-21 DIAGNOSIS — Z79899 Other long term (current) drug therapy: Secondary | ICD-10-CM | POA: Diagnosis not present

## 2016-03-15 DIAGNOSIS — B351 Tinea unguium: Secondary | ICD-10-CM | POA: Diagnosis not present

## 2016-03-15 DIAGNOSIS — M79675 Pain in left toe(s): Secondary | ICD-10-CM | POA: Diagnosis not present

## 2016-03-17 DIAGNOSIS — F205 Residual schizophrenia: Secondary | ICD-10-CM | POA: Diagnosis not present

## 2016-03-31 DIAGNOSIS — Z79899 Other long term (current) drug therapy: Secondary | ICD-10-CM | POA: Diagnosis not present

## 2016-04-13 DIAGNOSIS — E118 Type 2 diabetes mellitus with unspecified complications: Secondary | ICD-10-CM | POA: Diagnosis not present

## 2016-04-13 DIAGNOSIS — E0789 Other specified disorders of thyroid: Secondary | ICD-10-CM | POA: Diagnosis not present

## 2016-04-20 DIAGNOSIS — E032 Hypothyroidism due to medicaments and other exogenous substances: Secondary | ICD-10-CM | POA: Diagnosis not present

## 2016-04-20 DIAGNOSIS — N400 Enlarged prostate without lower urinary tract symptoms: Secondary | ICD-10-CM | POA: Diagnosis not present

## 2016-04-20 DIAGNOSIS — E118 Type 2 diabetes mellitus with unspecified complications: Secondary | ICD-10-CM | POA: Diagnosis not present

## 2016-04-30 DIAGNOSIS — Z87891 Personal history of nicotine dependence: Secondary | ICD-10-CM | POA: Diagnosis not present

## 2016-04-30 DIAGNOSIS — R490 Dysphonia: Secondary | ICD-10-CM | POA: Diagnosis not present

## 2016-04-30 DIAGNOSIS — I10 Essential (primary) hypertension: Secondary | ICD-10-CM | POA: Diagnosis not present

## 2016-04-30 DIAGNOSIS — E119 Type 2 diabetes mellitus without complications: Secondary | ICD-10-CM | POA: Diagnosis not present

## 2016-04-30 DIAGNOSIS — F329 Major depressive disorder, single episode, unspecified: Secondary | ICD-10-CM | POA: Diagnosis not present

## 2016-04-30 DIAGNOSIS — Z7984 Long term (current) use of oral hypoglycemic drugs: Secondary | ICD-10-CM | POA: Diagnosis not present

## 2016-04-30 DIAGNOSIS — Z7982 Long term (current) use of aspirin: Secondary | ICD-10-CM | POA: Diagnosis not present

## 2016-05-04 DIAGNOSIS — R490 Dysphonia: Secondary | ICD-10-CM | POA: Diagnosis not present

## 2016-05-04 DIAGNOSIS — I10 Essential (primary) hypertension: Secondary | ICD-10-CM | POA: Diagnosis not present

## 2016-05-04 DIAGNOSIS — F329 Major depressive disorder, single episode, unspecified: Secondary | ICD-10-CM | POA: Diagnosis not present

## 2016-05-04 DIAGNOSIS — Z7982 Long term (current) use of aspirin: Secondary | ICD-10-CM | POA: Diagnosis not present

## 2016-05-04 DIAGNOSIS — E119 Type 2 diabetes mellitus without complications: Secondary | ICD-10-CM | POA: Diagnosis not present

## 2016-05-04 DIAGNOSIS — Z7984 Long term (current) use of oral hypoglycemic drugs: Secondary | ICD-10-CM | POA: Diagnosis not present

## 2016-05-08 ENCOUNTER — Other Ambulatory Visit (HOSPITAL_COMMUNITY): Payer: Self-pay | Admitting: Psychiatry

## 2016-05-11 DIAGNOSIS — E119 Type 2 diabetes mellitus without complications: Secondary | ICD-10-CM | POA: Diagnosis not present

## 2016-05-11 DIAGNOSIS — F329 Major depressive disorder, single episode, unspecified: Secondary | ICD-10-CM | POA: Diagnosis not present

## 2016-05-11 DIAGNOSIS — Z7982 Long term (current) use of aspirin: Secondary | ICD-10-CM | POA: Diagnosis not present

## 2016-05-11 DIAGNOSIS — I10 Essential (primary) hypertension: Secondary | ICD-10-CM | POA: Diagnosis not present

## 2016-05-11 DIAGNOSIS — Z7984 Long term (current) use of oral hypoglycemic drugs: Secondary | ICD-10-CM | POA: Diagnosis not present

## 2016-05-11 DIAGNOSIS — R490 Dysphonia: Secondary | ICD-10-CM | POA: Diagnosis not present

## 2016-05-17 DIAGNOSIS — F329 Major depressive disorder, single episode, unspecified: Secondary | ICD-10-CM | POA: Diagnosis not present

## 2016-05-17 DIAGNOSIS — I10 Essential (primary) hypertension: Secondary | ICD-10-CM | POA: Diagnosis not present

## 2016-05-17 DIAGNOSIS — Z7984 Long term (current) use of oral hypoglycemic drugs: Secondary | ICD-10-CM | POA: Diagnosis not present

## 2016-05-17 DIAGNOSIS — Z7982 Long term (current) use of aspirin: Secondary | ICD-10-CM | POA: Diagnosis not present

## 2016-05-17 DIAGNOSIS — E119 Type 2 diabetes mellitus without complications: Secondary | ICD-10-CM | POA: Diagnosis not present

## 2016-05-17 DIAGNOSIS — R490 Dysphonia: Secondary | ICD-10-CM | POA: Diagnosis not present

## 2016-05-19 DIAGNOSIS — M25579 Pain in unspecified ankle and joints of unspecified foot: Secondary | ICD-10-CM | POA: Diagnosis not present

## 2016-05-19 DIAGNOSIS — M19072 Primary osteoarthritis, left ankle and foot: Secondary | ICD-10-CM | POA: Diagnosis not present

## 2016-05-19 DIAGNOSIS — Z23 Encounter for immunization: Secondary | ICD-10-CM | POA: Diagnosis not present

## 2016-05-19 DIAGNOSIS — I10 Essential (primary) hypertension: Secondary | ICD-10-CM | POA: Diagnosis not present

## 2016-05-19 DIAGNOSIS — E118 Type 2 diabetes mellitus with unspecified complications: Secondary | ICD-10-CM | POA: Diagnosis not present

## 2016-05-23 DIAGNOSIS — E119 Type 2 diabetes mellitus without complications: Secondary | ICD-10-CM | POA: Diagnosis not present

## 2016-05-23 DIAGNOSIS — I10 Essential (primary) hypertension: Secondary | ICD-10-CM | POA: Diagnosis not present

## 2016-05-23 DIAGNOSIS — Z7984 Long term (current) use of oral hypoglycemic drugs: Secondary | ICD-10-CM | POA: Diagnosis not present

## 2016-05-23 DIAGNOSIS — R490 Dysphonia: Secondary | ICD-10-CM | POA: Diagnosis not present

## 2016-05-23 DIAGNOSIS — F329 Major depressive disorder, single episode, unspecified: Secondary | ICD-10-CM | POA: Diagnosis not present

## 2016-05-23 DIAGNOSIS — Z7982 Long term (current) use of aspirin: Secondary | ICD-10-CM | POA: Diagnosis not present

## 2016-05-30 DIAGNOSIS — E119 Type 2 diabetes mellitus without complications: Secondary | ICD-10-CM | POA: Diagnosis not present

## 2016-05-30 DIAGNOSIS — Z7984 Long term (current) use of oral hypoglycemic drugs: Secondary | ICD-10-CM | POA: Diagnosis not present

## 2016-05-30 DIAGNOSIS — I10 Essential (primary) hypertension: Secondary | ICD-10-CM | POA: Diagnosis not present

## 2016-05-30 DIAGNOSIS — F329 Major depressive disorder, single episode, unspecified: Secondary | ICD-10-CM | POA: Diagnosis not present

## 2016-05-30 DIAGNOSIS — Z7982 Long term (current) use of aspirin: Secondary | ICD-10-CM | POA: Diagnosis not present

## 2016-05-30 DIAGNOSIS — R490 Dysphonia: Secondary | ICD-10-CM | POA: Diagnosis not present

## 2016-06-06 DIAGNOSIS — F258 Other schizoaffective disorders: Secondary | ICD-10-CM | POA: Diagnosis not present

## 2016-06-09 ENCOUNTER — Other Ambulatory Visit (HOSPITAL_COMMUNITY): Payer: Self-pay | Admitting: Psychiatry

## 2016-06-16 DIAGNOSIS — F258 Other schizoaffective disorders: Secondary | ICD-10-CM | POA: Diagnosis not present

## 2016-06-20 DIAGNOSIS — F258 Other schizoaffective disorders: Secondary | ICD-10-CM | POA: Diagnosis not present

## 2016-06-23 DIAGNOSIS — F258 Other schizoaffective disorders: Secondary | ICD-10-CM | POA: Diagnosis not present

## 2016-06-23 DIAGNOSIS — F205 Residual schizophrenia: Secondary | ICD-10-CM | POA: Diagnosis not present

## 2016-06-30 DIAGNOSIS — F258 Other schizoaffective disorders: Secondary | ICD-10-CM | POA: Diagnosis not present

## 2016-07-08 ENCOUNTER — Other Ambulatory Visit (HOSPITAL_COMMUNITY): Payer: Self-pay | Admitting: Psychiatry

## 2016-07-11 DIAGNOSIS — F258 Other schizoaffective disorders: Secondary | ICD-10-CM | POA: Diagnosis not present

## 2016-07-14 DIAGNOSIS — F258 Other schizoaffective disorders: Secondary | ICD-10-CM | POA: Diagnosis not present

## 2016-07-18 DIAGNOSIS — F258 Other schizoaffective disorders: Secondary | ICD-10-CM | POA: Diagnosis not present

## 2016-08-01 DIAGNOSIS — F258 Other schizoaffective disorders: Secondary | ICD-10-CM | POA: Diagnosis not present

## 2016-08-31 DIAGNOSIS — E032 Hypothyroidism due to medicaments and other exogenous substances: Secondary | ICD-10-CM | POA: Diagnosis not present

## 2016-08-31 DIAGNOSIS — Z23 Encounter for immunization: Secondary | ICD-10-CM | POA: Diagnosis not present

## 2016-08-31 DIAGNOSIS — Z79899 Other long term (current) drug therapy: Secondary | ICD-10-CM | POA: Diagnosis not present

## 2016-08-31 DIAGNOSIS — E118 Type 2 diabetes mellitus with unspecified complications: Secondary | ICD-10-CM | POA: Diagnosis not present

## 2016-08-31 DIAGNOSIS — E789 Disorder of lipoprotein metabolism, unspecified: Secondary | ICD-10-CM | POA: Diagnosis not present

## 2016-09-06 DIAGNOSIS — F258 Other schizoaffective disorders: Secondary | ICD-10-CM | POA: Diagnosis not present

## 2016-09-07 DIAGNOSIS — E119 Type 2 diabetes mellitus without complications: Secondary | ICD-10-CM | POA: Diagnosis not present

## 2016-09-07 DIAGNOSIS — G609 Hereditary and idiopathic neuropathy, unspecified: Secondary | ICD-10-CM | POA: Diagnosis not present

## 2016-09-07 DIAGNOSIS — L853 Xerosis cutis: Secondary | ICD-10-CM | POA: Diagnosis not present

## 2016-09-07 DIAGNOSIS — I739 Peripheral vascular disease, unspecified: Secondary | ICD-10-CM | POA: Diagnosis not present

## 2016-09-07 DIAGNOSIS — R600 Localized edema: Secondary | ICD-10-CM | POA: Diagnosis not present

## 2016-09-08 DIAGNOSIS — F205 Residual schizophrenia: Secondary | ICD-10-CM | POA: Diagnosis not present

## 2016-09-21 ENCOUNTER — Encounter: Payer: Self-pay | Admitting: Podiatry

## 2016-09-21 ENCOUNTER — Ambulatory Visit (INDEPENDENT_AMBULATORY_CARE_PROVIDER_SITE_OTHER): Payer: Medicare Other | Admitting: Podiatry

## 2016-09-21 VITALS — BP 167/83 | HR 89 | Resp 16 | Ht 72.0 in | Wt 180.0 lb

## 2016-09-21 DIAGNOSIS — E11319 Type 2 diabetes mellitus with unspecified diabetic retinopathy without macular edema: Secondary | ICD-10-CM | POA: Diagnosis not present

## 2016-09-21 DIAGNOSIS — H35033 Hypertensive retinopathy, bilateral: Secondary | ICD-10-CM | POA: Diagnosis not present

## 2016-09-21 DIAGNOSIS — B351 Tinea unguium: Secondary | ICD-10-CM

## 2016-09-21 DIAGNOSIS — E0843 Diabetes mellitus due to underlying condition with diabetic autonomic (poly)neuropathy: Secondary | ICD-10-CM | POA: Diagnosis not present

## 2016-09-21 DIAGNOSIS — H04123 Dry eye syndrome of bilateral lacrimal glands: Secondary | ICD-10-CM | POA: Diagnosis not present

## 2016-09-21 DIAGNOSIS — Z79899 Other long term (current) drug therapy: Secondary | ICD-10-CM | POA: Diagnosis not present

## 2016-09-21 DIAGNOSIS — E1100 Type 2 diabetes mellitus with hyperosmolarity without nonketotic hyperglycemic-hyperosmolar coma (NKHHC): Secondary | ICD-10-CM | POA: Diagnosis not present

## 2016-09-21 DIAGNOSIS — L608 Other nail disorders: Secondary | ICD-10-CM | POA: Diagnosis not present

## 2016-09-21 DIAGNOSIS — M79609 Pain in unspecified limb: Secondary | ICD-10-CM

## 2016-09-21 DIAGNOSIS — B309 Viral conjunctivitis, unspecified: Secondary | ICD-10-CM | POA: Diagnosis not present

## 2016-09-21 DIAGNOSIS — Z794 Long term (current) use of insulin: Secondary | ICD-10-CM | POA: Diagnosis not present

## 2016-09-21 DIAGNOSIS — L603 Nail dystrophy: Secondary | ICD-10-CM

## 2016-09-21 MED ORDER — NONFORMULARY OR COMPOUNDED ITEM: 1.0000 [drp] | Freq: Every day | 2 refills | Status: DC

## 2016-09-29 NOTE — Progress Notes (Signed)
   Subjective: Patient presents today for possible treatment and evaluation of fungal nails bilaterally 1 through 5. Patient states that the nails have been discolored and thickened for greater than 1 month. Patient presents today for further treatment and evaluation. Patient also has a history of diabetes mellitus and complains of elongated, thickened nails that are symptomatic and painful with shoe gear. Patient is unable to trim his own nails.  Objective: Physical Exam General: The patient is alert and oriented x3 in no acute distress.  Dermatology: Hyperkeratotic, discolored, thickened, onychodystrophy of nails noted bilaterally.  Skin is warm, dry and supple bilateral lower extremities. Negative for open lesions or macerations.  Vascular: Palpable pedal pulses bilaterally. No edema or erythema noted. Capillary refill within normal limits.  Neurological: Epicritic and protective threshold grossly intact bilaterally.   Musculoskeletal Exam: Range of motion within normal limits to all pedal and ankle joints bilateral. Muscle strength 5/5 in all groups bilateral.   Assessment: #1 onychodystrophy bilateral toenails #2 possible onychomycosis #3 hyperkeratotic nails bilateral #4 diabetes mellitus  Plan of Care:  #1 Patient was evaluated. #2 Orders for liver function tests were ordered today.  #3 Today nail biopsy was taken and sent to pathology for fungal culture. #5 mechanical debridement of nails 1-5 bilaterally was performed using a nail nipper without incident #6 prescription for sure take antifungal nail lacquer #7 if patient's culture results are positive for fungus in his liver function tests are normal, we will: A prescription of Lamisil 90 days #8 return to clinic in 4 months  Felecia ShellingBrent M. Guillermina Shaft, DPM [facility]  Dr. Felecia ShellingBrent M. Blaiden Werth, DPM    [address]                                        [place], [place] [address]                Office [phone]    Fax [phone]

## 2016-10-03 ENCOUNTER — Other Ambulatory Visit (HOSPITAL_COMMUNITY): Payer: Self-pay | Admitting: Psychiatry

## 2016-10-03 DIAGNOSIS — E11319 Type 2 diabetes mellitus with unspecified diabetic retinopathy without macular edema: Secondary | ICD-10-CM | POA: Diagnosis not present

## 2016-10-03 DIAGNOSIS — E1100 Type 2 diabetes mellitus with hyperosmolarity without nonketotic hyperglycemic-hyperosmolar coma (NKHHC): Secondary | ICD-10-CM | POA: Diagnosis not present

## 2016-10-03 DIAGNOSIS — H04123 Dry eye syndrome of bilateral lacrimal glands: Secondary | ICD-10-CM | POA: Diagnosis not present

## 2016-10-03 DIAGNOSIS — H35033 Hypertensive retinopathy, bilateral: Secondary | ICD-10-CM | POA: Diagnosis not present

## 2016-10-03 DIAGNOSIS — H43813 Vitreous degeneration, bilateral: Secondary | ICD-10-CM | POA: Diagnosis not present

## 2016-10-03 DIAGNOSIS — B309 Viral conjunctivitis, unspecified: Secondary | ICD-10-CM | POA: Diagnosis not present

## 2016-10-10 ENCOUNTER — Other Ambulatory Visit (HOSPITAL_COMMUNITY): Payer: Self-pay | Admitting: Psychiatry

## 2016-10-14 ENCOUNTER — Telehealth: Payer: Self-pay | Admitting: *Deleted

## 2016-10-14 DIAGNOSIS — Z79899 Other long term (current) drug therapy: Secondary | ICD-10-CM

## 2016-10-14 NOTE — Telephone Encounter (Signed)
Dr. Logan BoresEvans states pt has + fungal culture, he is to complete his liver function test, and after reviewed will call in lamisil, continue the daily [employer] antifungal nail lacquer, and return to office in 4 months. I spoke with pt and he states he has been trying to get [facility] to draw his blood at the [facility]. I told him I could fax a copy of the orders to him. [facility] and faxed to [phone].

## 2016-10-15 ENCOUNTER — Other Ambulatory Visit (HOSPITAL_COMMUNITY): Payer: Self-pay | Admitting: Psychiatry

## 2016-10-18 DIAGNOSIS — Z79899 Other long term (current) drug therapy: Secondary | ICD-10-CM | POA: Diagnosis not present

## 2016-10-25 DIAGNOSIS — F258 Other schizoaffective disorders: Secondary | ICD-10-CM | POA: Diagnosis not present

## 2016-11-09 ENCOUNTER — Ambulatory Visit: Payer: Medicare Other | Admitting: Podiatry

## 2016-11-10 DIAGNOSIS — F205 Residual schizophrenia: Secondary | ICD-10-CM | POA: Diagnosis not present

## 2016-11-16 DIAGNOSIS — Z79899 Other long term (current) drug therapy: Secondary | ICD-10-CM | POA: Diagnosis not present

## 2016-11-16 DIAGNOSIS — E039 Hypothyroidism, unspecified: Secondary | ICD-10-CM | POA: Diagnosis not present

## 2016-11-22 DIAGNOSIS — E789 Disorder of lipoprotein metabolism, unspecified: Secondary | ICD-10-CM | POA: Diagnosis not present

## 2016-11-22 DIAGNOSIS — M869 Osteomyelitis, unspecified: Secondary | ICD-10-CM | POA: Diagnosis not present

## 2016-11-22 DIAGNOSIS — L0390 Cellulitis, unspecified: Secondary | ICD-10-CM | POA: Diagnosis not present

## 2016-11-22 DIAGNOSIS — E118 Type 2 diabetes mellitus with unspecified complications: Secondary | ICD-10-CM | POA: Diagnosis not present

## 2016-11-24 DIAGNOSIS — M869 Osteomyelitis, unspecified: Secondary | ICD-10-CM | POA: Diagnosis not present

## 2016-11-25 ENCOUNTER — Emergency Department (HOSPITAL_COMMUNITY): Payer: Medicare Other

## 2016-11-25 ENCOUNTER — Encounter (HOSPITAL_COMMUNITY): Payer: Self-pay | Admitting: Emergency Medicine

## 2016-11-25 ENCOUNTER — Inpatient Hospital Stay (HOSPITAL_COMMUNITY)
Admission: EM | Admit: 2016-11-25 | Discharge: 2016-11-30 | DRG: 617 | Disposition: A | Discharge status: Skilled Nursing Facility | Payer: Medicare Other | Attending: Internal Medicine | Admitting: Internal Medicine

## 2016-11-25 DIAGNOSIS — M869 Osteomyelitis, unspecified: Secondary | ICD-10-CM | POA: Diagnosis present

## 2016-11-25 DIAGNOSIS — Z87891 Personal history of nicotine dependence: Secondary | ICD-10-CM

## 2016-11-25 DIAGNOSIS — M009 Pyogenic arthritis, unspecified: Secondary | ICD-10-CM | POA: Diagnosis not present

## 2016-11-25 DIAGNOSIS — E440 Moderate protein-calorie malnutrition: Secondary | ICD-10-CM | POA: Insufficient documentation

## 2016-11-25 DIAGNOSIS — E1152 Type 2 diabetes mellitus with diabetic peripheral angiopathy with gangrene: Secondary | ICD-10-CM | POA: Diagnosis present

## 2016-11-25 DIAGNOSIS — S98139A Complete traumatic amputation of one unspecified lesser toe, initial encounter: Secondary | ICD-10-CM | POA: Diagnosis not present

## 2016-11-25 DIAGNOSIS — B952 Enterococcus as the cause of diseases classified elsewhere: Secondary | ICD-10-CM | POA: Diagnosis present

## 2016-11-25 DIAGNOSIS — F419 Anxiety disorder, unspecified: Secondary | ICD-10-CM | POA: Diagnosis not present

## 2016-11-25 DIAGNOSIS — N401 Enlarged prostate with lower urinary tract symptoms: Secondary | ICD-10-CM | POA: Diagnosis present

## 2016-11-25 DIAGNOSIS — Z6825 Body mass index (BMI) 25.0-25.9, adult: Secondary | ICD-10-CM

## 2016-11-25 DIAGNOSIS — Z794 Long term (current) use of insulin: Secondary | ICD-10-CM | POA: Diagnosis not present

## 2016-11-25 DIAGNOSIS — E079 Disorder of thyroid, unspecified: Secondary | ICD-10-CM | POA: Diagnosis present

## 2016-11-25 DIAGNOSIS — E119 Type 2 diabetes mellitus without complications: Secondary | ICD-10-CM

## 2016-11-25 DIAGNOSIS — Z7982 Long term (current) use of aspirin: Secondary | ICD-10-CM | POA: Diagnosis not present

## 2016-11-25 DIAGNOSIS — I70202 Unspecified atherosclerosis of native arteries of extremities, left leg: Secondary | ICD-10-CM | POA: Diagnosis present

## 2016-11-25 DIAGNOSIS — E1140 Type 2 diabetes mellitus with diabetic neuropathy, unspecified: Secondary | ICD-10-CM | POA: Diagnosis present

## 2016-11-25 DIAGNOSIS — I739 Peripheral vascular disease, unspecified: Secondary | ICD-10-CM | POA: Diagnosis not present

## 2016-11-25 DIAGNOSIS — Z79899 Other long term (current) drug therapy: Secondary | ICD-10-CM

## 2016-11-25 DIAGNOSIS — E1169 Type 2 diabetes mellitus with other specified complication: Principal | ICD-10-CM | POA: Diagnosis present

## 2016-11-25 DIAGNOSIS — L97529 Non-pressure chronic ulcer of other part of left foot with unspecified severity: Secondary | ICD-10-CM | POA: Diagnosis present

## 2016-11-25 DIAGNOSIS — M86172 Other acute osteomyelitis, left ankle and foot: Secondary | ICD-10-CM | POA: Diagnosis not present

## 2016-11-25 DIAGNOSIS — F329 Major depressive disorder, single episode, unspecified: Secondary | ICD-10-CM | POA: Diagnosis not present

## 2016-11-25 DIAGNOSIS — S91302A Unspecified open wound, left foot, initial encounter: Secondary | ICD-10-CM | POA: Diagnosis present

## 2016-11-25 DIAGNOSIS — K5900 Constipation, unspecified: Secondary | ICD-10-CM | POA: Diagnosis present

## 2016-11-25 DIAGNOSIS — M86471 Chronic osteomyelitis with draining sinus, right ankle and foot: Secondary | ICD-10-CM | POA: Diagnosis not present

## 2016-11-25 DIAGNOSIS — G8918 Other acute postprocedural pain: Secondary | ICD-10-CM | POA: Diagnosis not present

## 2016-11-25 DIAGNOSIS — N400 Enlarged prostate without lower urinary tract symptoms: Secondary | ICD-10-CM | POA: Diagnosis present

## 2016-11-25 DIAGNOSIS — M00072 Staphylococcal arthritis, left ankle and foot: Secondary | ICD-10-CM | POA: Diagnosis not present

## 2016-11-25 DIAGNOSIS — M0089 Polyarthritis due to other bacteria: Secondary | ICD-10-CM | POA: Diagnosis not present

## 2016-11-25 DIAGNOSIS — I10 Essential (primary) hypertension: Secondary | ICD-10-CM | POA: Diagnosis not present

## 2016-11-25 DIAGNOSIS — I70245 Atherosclerosis of native arteries of left leg with ulceration of other part of foot: Secondary | ICD-10-CM | POA: Diagnosis not present

## 2016-11-25 DIAGNOSIS — E11621 Type 2 diabetes mellitus with foot ulcer: Secondary | ICD-10-CM | POA: Diagnosis not present

## 2016-11-25 DIAGNOSIS — L03119 Cellulitis of unspecified part of limb: Secondary | ICD-10-CM | POA: Diagnosis not present

## 2016-11-25 DIAGNOSIS — F32A Depression, unspecified: Secondary | ICD-10-CM | POA: Diagnosis present

## 2016-11-25 DIAGNOSIS — R3911 Hesitancy of micturition: Secondary | ICD-10-CM | POA: Diagnosis not present

## 2016-11-25 DIAGNOSIS — E039 Hypothyroidism, unspecified: Secondary | ICD-10-CM | POA: Diagnosis present

## 2016-11-25 DIAGNOSIS — E13628 Other specified diabetes mellitus with other skin complications: Secondary | ICD-10-CM | POA: Diagnosis not present

## 2016-11-25 DIAGNOSIS — M79672 Pain in left foot: Secondary | ICD-10-CM | POA: Diagnosis not present

## 2016-11-25 DIAGNOSIS — S98119A Complete traumatic amputation of unspecified great toe, initial encounter: Secondary | ICD-10-CM | POA: Diagnosis not present

## 2016-11-25 HISTORY — DX: Major depressive disorder, single episode, unspecified: F32.9

## 2016-11-25 HISTORY — DX: Essential (primary) hypertension: I10

## 2016-11-25 HISTORY — DX: Type 2 diabetes mellitus without complications: E11.9

## 2016-11-25 HISTORY — DX: Benign prostatic hyperplasia without lower urinary tract symptoms: N40.0

## 2016-11-25 HISTORY — DX: Disorder of thyroid, unspecified: E07.9

## 2016-11-25 HISTORY — DX: Anxiety disorder, unspecified: F41.9

## 2016-11-25 HISTORY — DX: Depression, unspecified: F32.A

## 2016-11-25 LAB — CBC WITH DIFFERENTIAL/PLATELET
BASOS ABS: 0 10*3/uL (ref 0.0–0.1)
Basophils Relative: 0 %
Eosinophils Absolute: 0.1 10*3/uL (ref 0.0–0.7)
Eosinophils Relative: 2 %
HEMATOCRIT: 33.7 % — AB (ref 39.0–52.0)
HEMOGLOBIN: 10.9 g/dL — AB (ref 13.0–17.0)
LYMPHS PCT: 26 %
Lymphs Abs: 1.8 10*3/uL (ref 0.7–4.0)
MCH: 29.1 pg (ref 26.0–34.0)
MCHC: 32.3 g/dL (ref 30.0–36.0)
MCV: 89.9 fL (ref 78.0–100.0)
MONO ABS: 0.6 10*3/uL (ref 0.1–1.0)
MONOS PCT: 9 %
NEUTROS ABS: 4.6 10*3/uL (ref 1.7–7.7)
Neutrophils Relative %: 63 %
Platelets: 376 10*3/uL (ref 150–400)
RBC: 3.75 MIL/uL — ABNORMAL LOW (ref 4.22–5.81)
RDW: 12.5 % (ref 11.5–15.5)
WBC: 7.2 10*3/uL (ref 4.0–10.5)

## 2016-11-25 LAB — COMPREHENSIVE METABOLIC PANEL
ALBUMIN: 2.8 g/dL — AB (ref 3.5–5.0)
ALT: 15 U/L — ABNORMAL LOW (ref 17–63)
ANION GAP: 7 (ref 5–15)
AST: 15 U/L (ref 15–41)
Alkaline Phosphatase: 59 U/L (ref 38–126)
BUN: 11 mg/dL (ref 6–20)
CO2: 28 mmol/L (ref 22–32)
Calcium: 8.5 mg/dL — ABNORMAL LOW (ref 8.9–10.3)
Chloride: 101 mmol/L (ref 101–111)
Creatinine, Ser: 0.91 mg/dL (ref 0.61–1.24)
GFR calc Af Amer: >60 mL/min (ref >60)
GFR calc non Af Amer: >60 mL/min (ref >60)
GLUCOSE: 237 mg/dL — AB (ref 65–99)
POTASSIUM: 4 mmol/L (ref 3.5–5.1)
SODIUM: 136 mmol/L (ref 135–145)
Total Bilirubin: 0.6 mg/dL (ref 0.3–1.2)
Total Protein: 6.5 g/dL (ref 6.5–8.1)

## 2016-11-25 LAB — GLUCOSE, CAPILLARY: Glucose-Capillary: 81 mg/dL (ref 65–99)

## 2016-11-25 LAB — CBC
HEMATOCRIT: 33.4 % — AB (ref 39.0–52.0)
Hemoglobin: 10.8 g/dL — ABNORMAL LOW (ref 13.0–17.0)
MCH: 29 pg (ref 26.0–34.0)
MCHC: 32.3 g/dL (ref 30.0–36.0)
MCV: 89.8 fL (ref 78.0–100.0)
Platelets: 375 10*3/uL (ref 150–400)
RBC: 3.72 MIL/uL — AB (ref 4.22–5.81)
RDW: 12.5 % (ref 11.5–15.5)
WBC: 7.7 10*3/uL (ref 4.0–10.5)

## 2016-11-25 LAB — CREATININE, SERUM
Creatinine, Ser: 0.74 mg/dL (ref 0.61–1.24)
GFR calc non Af Amer: >60 mL/min (ref >60)

## 2016-11-25 LAB — I-STAT CG4 LACTIC ACID, ED
LACTIC ACID, VENOUS: 2.01 mmol/L — AB (ref 0.5–1.9)
Lactic Acid, Venous: 1.81 mmol/L (ref 0.5–1.9)

## 2016-11-25 LAB — SEDIMENTATION RATE: Sed Rate: 85 mm/hr — ABNORMAL HIGH (ref 0–16)

## 2016-11-25 LAB — C-REACTIVE PROTEIN: CRP: 6.2 mg/dL — ABNORMAL HIGH (ref <1.0)

## 2016-11-25 LAB — PREALBUMIN: Prealbumin: 16.5 mg/dL — ABNORMAL LOW (ref 18–38)

## 2016-11-25 LAB — TSH: TSH: 2.565 u[IU]/mL (ref 0.350–4.500)

## 2016-11-25 MED ORDER — ADULT MULTIVITAMIN W/MINERALS CH
1.0000 | ORAL_TABLET | Freq: Every day | ORAL | Status: DC
  Administered 2016-11-27 – 2016-11-30 (×4): 1 via ORAL
  Filled 2016-11-25 (×4): qty 1

## 2016-11-25 MED ORDER — INSULIN ASPART 100 UNIT/ML ~~LOC~~ SOLN
0.0000 [IU] | Freq: Three times a day (TID) | SUBCUTANEOUS | Status: DC
  Administered 2016-11-27: 3 [IU] via SUBCUTANEOUS
  Administered 2016-11-27: 5 [IU] via SUBCUTANEOUS
  Administered 2016-11-28: 8 [IU] via SUBCUTANEOUS
  Administered 2016-11-29: 11 [IU] via SUBCUTANEOUS
  Administered 2016-11-29: 5 [IU] via SUBCUTANEOUS
  Administered 2016-11-29: 3 [IU] via SUBCUTANEOUS
  Administered 2016-11-30: 8 [IU] via SUBCUTANEOUS
  Administered 2016-11-30 (×2): 3 [IU] via SUBCUTANEOUS

## 2016-11-25 MED ORDER — INSULIN ASPART 100 UNIT/ML ~~LOC~~ SOLN
0.0000 [IU] | Freq: Every day | SUBCUTANEOUS | Status: DC
  Administered 2016-11-27: 3 [IU] via SUBCUTANEOUS

## 2016-11-25 MED ORDER — HYDROCODONE-ACETAMINOPHEN 5-325 MG PO TABS
1.0000 | ORAL_TABLET | Freq: Once | ORAL | Status: AC
  Administered 2016-11-25: 1 via ORAL
  Filled 2016-11-25: qty 1

## 2016-11-25 MED ORDER — VANCOMYCIN HCL 10 G IV SOLR
2000.0000 mg | Freq: Once | INTRAVENOUS | Status: AC
  Administered 2016-11-26: 2000 mg via INTRAVENOUS
  Filled 2016-11-25: qty 2000

## 2016-11-25 MED ORDER — HEPARIN SODIUM (PORCINE) 5000 UNIT/ML IJ SOLN
5000.0000 [IU] | Freq: Three times a day (TID) | INTRAMUSCULAR | Status: DC
  Administered 2016-11-25 – 2016-11-30 (×13): 5000 [IU] via SUBCUTANEOUS
  Filled 2016-11-25 (×12): qty 1

## 2016-11-25 MED ORDER — HYDROCODONE-ACETAMINOPHEN 5-325 MG PO TABS
1.0000 | ORAL_TABLET | ORAL | Status: DC | PRN
  Administered 2016-11-25 – 2016-11-26 (×2): 2 via ORAL
  Administered 2016-11-27 (×2): 1 via ORAL
  Administered 2016-11-27 – 2016-11-28 (×3): 2 via ORAL
  Administered 2016-11-28 (×2): 1 via ORAL
  Filled 2016-11-25 (×4): qty 2
  Filled 2016-11-25 (×2): qty 1
  Filled 2016-11-25 (×2): qty 2
  Filled 2016-11-25: qty 1

## 2016-11-25 MED ORDER — CLINDAMYCIN PHOSPHATE 900 MG/50ML IV SOLN
900.0000 mg | Freq: Once | INTRAVENOUS | Status: AC
  Administered 2016-11-25: 900 mg via INTRAVENOUS
  Filled 2016-11-25: qty 50

## 2016-11-25 MED ORDER — INSULIN DETEMIR 100 UNIT/ML ~~LOC~~ SOLN
26.0000 [IU] | SUBCUTANEOUS | Status: DC
  Administered 2016-11-26 – 2016-11-30 (×5): 26 [IU] via SUBCUTANEOUS
  Filled 2016-11-25 (×5): qty 0.26

## 2016-11-25 MED ORDER — PREGABALIN 75 MG PO CAPS
75.0000 mg | ORAL_CAPSULE | Freq: Three times a day (TID) | ORAL | Status: DC
  Administered 2016-11-25 – 2016-11-30 (×12): 75 mg via ORAL
  Filled 2016-11-25 (×12): qty 1

## 2016-11-25 MED ORDER — METRONIDAZOLE 500 MG PO TABS
500.0000 mg | ORAL_TABLET | Freq: Three times a day (TID) | ORAL | Status: DC
  Administered 2016-11-25 – 2016-11-26 (×2): 500 mg via ORAL
  Filled 2016-11-25 (×2): qty 1

## 2016-11-25 MED ORDER — DOCUSATE SODIUM 100 MG PO CAPS
100.0000 mg | ORAL_CAPSULE | Freq: Two times a day (BID) | ORAL | Status: DC
  Administered 2016-11-25 – 2016-11-26 (×2): 100 mg via ORAL
  Filled 2016-11-25 (×2): qty 1

## 2016-11-25 MED ORDER — SYSTANE NIGHTTIME OP OINT: 1.0000 "application " | TOPICAL_OINTMENT | Freq: Every day | OPHTHALMIC | Status: DC

## 2016-11-25 MED ORDER — ONDANSETRON HCL 4 MG PO TABS: 4.0000 mg | ORAL_TABLET | Freq: Four times a day (QID) | ORAL | Status: DC | PRN

## 2016-11-25 MED ORDER — DEXTROSE 5 % IV SOLN
2.0000 g | Freq: Every day | INTRAVENOUS | Status: DC
  Administered 2016-11-25: 2 g via INTRAVENOUS
  Filled 2016-11-25: qty 2

## 2016-11-25 MED ORDER — RAMIPRIL 5 MG PO CAPS
5.0000 mg | ORAL_CAPSULE | Freq: Every day | ORAL | Status: DC
  Administered 2016-11-26 – 2016-11-30 (×5): 5 mg via ORAL
  Filled 2016-11-25 (×6): qty 1

## 2016-11-25 MED ORDER — LEVOCETIRIZINE DIHYDROCHLORIDE 5 MG PO TABS: 5.0000 mg | ORAL_TABLET | ORAL | Status: DC

## 2016-11-25 MED ORDER — LEVOTHYROXINE SODIUM 100 MCG PO TABS
200.0000 ug | ORAL_TABLET | Freq: Every day | ORAL | Status: DC
  Administered 2016-11-26 – 2016-11-30 (×6): 200 ug via ORAL
  Filled 2016-11-25 (×6): qty 2

## 2016-11-25 MED ORDER — DIVALPROEX SODIUM ER 500 MG PO TB24
1000.0000 mg | ORAL_TABLET | Freq: Every day | ORAL | Status: DC
  Administered 2016-11-26 – 2016-11-30 (×4): 1000 mg via ORAL
  Filled 2016-11-25 (×5): qty 2

## 2016-11-25 MED ORDER — SODIUM CHLORIDE 0.9 % IV BOLUS (SEPSIS)
1000.0000 mL | Freq: Once | INTRAVENOUS | Status: AC
  Administered 2016-11-25: 1000 mL via INTRAVENOUS

## 2016-11-25 MED ORDER — ARTIFICIAL TEARS OP OINT
TOPICAL_OINTMENT | Freq: Every day | OPHTHALMIC | Status: DC
  Administered 2016-11-25: 23:00:00 via OPHTHALMIC
  Administered 2016-11-26 – 2016-11-27 (×2): 1 via OPHTHALMIC
  Administered 2016-11-28 – 2016-11-29 (×2): via OPHTHALMIC
  Filled 2016-11-25 (×2): qty 3.5

## 2016-11-25 MED ORDER — METFORMIN HCL ER 500 MG PO TB24: 1000.0000 mg | ORAL_TABLET | Freq: Two times a day (BID) | ORAL | Status: DC

## 2016-11-25 MED ORDER — BUPROPION HCL ER (XL) 300 MG PO TB24
300.0000 mg | ORAL_TABLET | Freq: Every day | ORAL | Status: DC
  Administered 2016-11-26 – 2016-11-30 (×5): 300 mg via ORAL
  Filled 2016-11-25 (×2): qty 1
  Filled 2016-11-25 (×2): qty 2
  Filled 2016-11-25: qty 1

## 2016-11-25 MED ORDER — PANCRELIPASE (LIP-PROT-AMYL) 12000-38000 UNITS PO CPEP
24000.0000 [IU] | ORAL_CAPSULE | Freq: Three times a day (TID) | ORAL | Status: DC
  Administered 2016-11-26 – 2016-11-30 (×11): 24000 [IU] via ORAL
  Filled 2016-11-25 (×16): qty 2

## 2016-11-25 MED ORDER — TAMSULOSIN HCL 0.4 MG PO CAPS
0.4000 mg | ORAL_CAPSULE | Freq: Every day | ORAL | Status: DC
  Administered 2016-11-26 – 2016-11-30 (×5): 0.4 mg via ORAL
  Filled 2016-11-25 (×5): qty 1

## 2016-11-25 MED ORDER — HYDRALAZINE HCL 10 MG PO TABS: 10.0000 mg | ORAL_TABLET | Freq: Three times a day (TID) | ORAL | Status: DC | PRN

## 2016-11-25 MED ORDER — POLYETHYLENE GLYCOL 3350 17 G PO PACK
17.0000 g | PACK | Freq: Every day | ORAL | Status: DC | PRN
  Filled 2016-11-25: qty 1

## 2016-11-25 MED ORDER — ONDANSETRON HCL 4 MG/2ML IJ SOLN: 4.0000 mg | Freq: Four times a day (QID) | INTRAMUSCULAR | Status: DC | PRN

## 2016-11-25 MED ORDER — CLONAZEPAM 0.5 MG PO TABS
0.5000 mg | ORAL_TABLET | Freq: Two times a day (BID) | ORAL | Status: DC
  Administered 2016-11-25 – 2016-11-30 (×9): 0.5 mg via ORAL
  Filled 2016-11-25 (×9): qty 1

## 2016-11-25 MED ORDER — SIMVASTATIN 40 MG PO TABS
40.0000 mg | ORAL_TABLET | Freq: Every day | ORAL | Status: DC
  Administered 2016-11-25 – 2016-11-29 (×5): 40 mg via ORAL
  Filled 2016-11-25 (×6): qty 1

## 2016-11-25 MED ORDER — VANCOMYCIN HCL IN DEXTROSE 1-5 GM/200ML-% IV SOLN
1000.0000 mg | Freq: Two times a day (BID) | INTRAVENOUS | Status: DC
  Administered 2016-11-26 – 2016-11-30 (×8): 1000 mg via INTRAVENOUS
  Filled 2016-11-25 (×9): qty 200

## 2016-11-25 MED ORDER — MELOXICAM 7.5 MG PO TABS
7.5000 mg | ORAL_TABLET | Freq: Every day | ORAL | Status: DC
Start: 2016-11-26 — End: 2016-11-30
  Administered 2016-11-27 – 2016-11-30 (×3): 7.5 mg via ORAL
  Filled 2016-11-25 (×3): qty 1

## 2016-11-25 NOTE — ED Triage Notes (Signed)
Pt from [facility], states his podiatrist sent him in for concerns of cellulitis from diabetic ulcer on left foot. Pt has redness and swelling present to foot. Hx of diabetic neuropathy, unsure how long diabetic ulcer has been present.

## 2016-11-25 NOTE — H&P (Addendum)
History and Physical    Ralph [name] ZOX:[id number] DOB: July 30, 1951 DOA: 11/25/2016  PCP: Michiel Sites, MD  Patient coming from: SNF   Chief Complaint: Foot pain, swelling  HPI: Ralph [name] is a 66 y.o. male with medical history significant of DM2, BPH, HTN, hypothyroidism (formerly hyperthyroidism, s/p ablation), Depression and anxiety who presents for left foot wound, swelling, erythema and pain.  Ralph [name] reports that a few weeks ago he got new shoes that he thinks were too narrow.  He developed a blister on the lateral aspect of his 5th toe.  He noted the blister, but hoped that it would improve.  He continued to wear the shoes.  He got thick socks, but this did not seem to help.  He is insensate at the toes and distal portion of the foot and had neuropathic pain in the other parts of the foot.  The symptoms started about 2 weeks ago and have been progressing.  Today, a custodian at his SNF noticed increased swelling and erythema and advised that he get seen.  He has had pain which feels sharp and nerve like in the foot, and this was worse today but improved with hydrocodone given in the ED.  He has had no fever, chills.  He has a normal blood pressure and not other symptoms of sepsis.  He has no chest pain, SOB.  He reports only some mild constipation with no BM for 1 week, but no abdominal pain or swelling.  He does have BPH and expresses some urinary hesitancy  Of note, he was seen on 09/21/16 by his podiatrist and there was no wound noted at that time.   ED Course: Lactic acid noted to be 2.01 which improved with fluids.  He was given a dose of Abx.  A foot xray showed likely septic arthritis and osteomyelitis.   Review of Systems: As per HPI otherwise 10 point review of systems negative.   Past Medical History:  Diagnosis Date  . Anxiety   . BPH (benign prostatic hyperplasia)   . Depression   . Diabetes mellitus without complication (HCC)   . Hypertension   . Thyroid  disease    hypothyroid    Past Surgical History:  Procedure Laterality Date  . CATARACT EXTRACTION    . EYE SURGERY    . thyroid ablation       reports that he has quit smoking. He has never used smokeless tobacco. He reports that he does not drink alcohol or use drugs. Confirmed with patient.   Allergies  Allergen Reactions  . Antihistamines, Chlorpheniramine-Type Other (See Comments)    Per MAR  . Antihistamines, Diphenhydramine-Type Other (See Comments)    Per MAR  . Antihistamines, Loratadine-Type Other (See Comments)    Per MAR  . Benadryl [Diphenhydramine Hcl] Other (See Comments)  . Ethanolamine Other (See Comments)    Per MAR    Family History  Problem Relation Age of Onset  . Diabetes Mellitus II Mother   . Heart disease Father   . Lung cancer Father   . Diabetes Mellitus II Paternal Grandfather    Reviewed med list from facility.  Prior to Admission medications   Medication Sig Start Date End Date Taking? Authorizing Provider  acetaminophen (TYLENOL) 325 MG tablet Take 650 mg by mouth every 6 (six) hours as needed for moderate pain.    Yes Historical Provider, MD  aspirin EC 81 MG tablet Take 81 mg by mouth daily.   Yes Historical  Provider, MD  buPROPion (WELLBUTRIN XL) 300 MG 24 hr tablet Take 300 mg by mouth daily.   Yes Historical Provider, MD  canagliflozin (INVOKANA) 300 MG TABS tablet Take 300 mg by mouth daily before breakfast.   Yes Historical Provider, MD  clonazePAM (KLONOPIN) 0.5 MG tablet Take 0.5 mg by mouth 2 (two) times daily.   Yes Historical Provider, MD  divalproex (DEPAKOTE ER) 500 MG 24 hr tablet Take 1,000 mg by mouth every morning.   Yes Historical Provider, MD  insulin detemir (LEVEMIR) 100 UNIT/ML injection Inject 26 Units into the skin every morning.   Yes Historical Provider, MD  Lactobacillus Casei-Folic Acid (RESTORA RX) 60-1.25 MG CAPS Take 1 capsule by mouth daily.   Yes Historical Provider, MD  levocetirizine (XYZAL) 5 MG tablet  Take 5 mg by mouth every morning.   Yes Historical Provider, MD  levothyroxine (SYNTHROID, LEVOTHROID) 200 MCG tablet Take 200 mcg by mouth daily before breakfast.   Yes Historical Provider, MD  liraglutide (VICTOZA) 18 MG/3ML SOPN Inject 1.8 mg into the skin every morning.   Yes Historical Provider, MD  meloxicam (MOBIC) 7.5 MG tablet Take 7.5 mg by mouth daily.   Yes Historical Provider, MD  metFORMIN (GLUCOPHAGE-XR) 500 MG 24 hr tablet Take 1,000 mg by mouth 2 (two) times daily.   Yes Historical Provider, MD  Multiple Vitamin (MULTIVITAMIN WITH MINERALS) TABS tablet Take 1 tablet by mouth daily.   Yes Historical Provider, MD  Pancrelipase, Lip-Prot-Amyl, (CREON) 24000-76000 units CPEP Take 3 capsules by mouth 3 (three) times daily with meals. Along with 120000 units of amylase-included in capsules   Yes Historical Provider, MD  Polyethyl Glycol-Propyl Glycol (SYSTANE) 0.4-0.3 % SOLN Apply 1 drop to eye 4 (four) times daily.   Yes Historical Provider, MD  pregabalin (LYRICA) 75 MG capsule Take 75 mg by mouth 3 (three) times daily.   Yes Historical Provider, MD  ramipril (ALTACE) 5 MG capsule Take 5 mg by mouth daily.   Yes Historical Provider, MD  simvastatin (ZOCOR) 40 MG tablet Take 40 mg by mouth at bedtime.   Yes Historical Provider, MD  tamsulosin (FLOMAX) 0.4 MG CAPS capsule Take 0.4 mg by mouth daily after breakfast.   Yes Historical Provider, MD  White Petrolatum-Mineral Oil (SYSTANE NIGHTTIME) OINT Place 1 application into both eyes at bedtime.   Yes Historical Provider, MD    Physical Exam: Vitals:   11/25/16 1630 11/25/16 1700 11/25/16 1800 11/25/16 1930  BP: (!) 153/81 (!) 155/68 (!) 176/84 (!) 174/81  Pulse: 85 83 88 88  Resp: Temp:      TempSrc:      SpO2: 99% 100% 100% 100%    Constitutional: NAD, calm, comfortable, very talkative, occasional stuttering.  Vitals:   11/25/16 1630 11/25/16 1700 11/25/16 1800 11/25/16 1930  BP: (!) 153/81 (!) 155/68 (!) 176/84  (!) 174/81  Pulse: 85 83 88 88  Resp: Temp:      TempSrc:      SpO2: 99% 100% 100% 100%   Eyes: EOMI, left lower eyelid asymmetric, anicteric sclerae ENMT: Mucous membranes are moist. Poor dentition Respiratory: CTAB, no wheezing, no crackles. Normal respiratory effort.  Cardiovascular: RR, NR, no murmurs / rubs / gallops. Left sided edema to the mid calf. Pulses palpable in DP on the right and only in PT on the left.  Swelling made exam more difficult.   Abdomen: no tenderness, no masses palpated. +BS, some mild  distention Musculoskeletal: No clubbing noted, + edema to left Lower extremity extending up from ankle.  He has no tenderness to palpation or palpable cord Skin: Circular open wound, about the size of a nickel on the lateral aspect of the 5th metatarsal, + expressible fluid which is thick and white.  Surrounding this area and going up into the anterior foot and ankle is a large area of erythema which stops at the ankle.  He has swelling to the mid calf on that side.  Neurologic: Moving all extremities, no pain to palpation of the foot or to expression of pus from the wound.   Psychiatric: Normal judgment and insight. Alert and oriented x 3. Normal mood.    Labs on Admission: I have personally reviewed following labs and imaging studies  CBC:  Recent Labs Lab 11/25/16 1410  WBC 7.2  NEUTROABS 4.6  HGB 10.9*  HCT 33.7*  MCV 89.9  PLT 376   Basic Metabolic Panel:  Recent Labs Lab 11/25/16 1410  NA 136  K 4.0  CL 101  CO2 28  GLUCOSE 237*  BUN 11  CREATININE 0.91  CALCIUM 8.5*   GFR: CrCl cannot be calculated (Unknown ideal weight.). Liver Function Tests:  Recent Labs Lab 11/25/16 1410  AST 15  ALT 15*  ALKPHOS 59  BILITOT 0.6  PROT 6.5  ALBUMIN 2.8*   No results for input(s): LIPASE, AMYLASE in the last 168 hours. No results for input(s): AMMONIA in the last 168 hours. Coagulation Profile: No results for input(s): INR, PROTIME in the  last 168 hours. Cardiac Enzymes: No results for input(s): CKTOTAL, CKMB, CKMBINDEX, TROPONINI in the last 168 hours. BNP (last 3 results) No results for input(s): PROBNP in the last 8760 hours. HbA1C: No results for input(s): HGBA1C in the last 72 hours. CBG: No results for input(s): GLUCAP in the last 168 hours. Lipid Profile: No results for input(s): CHOL, HDL, LDLCALC, TRIG, CHOLHDL, LDLDIRECT in the last 72 hours. Thyroid Function Tests: No results for input(s): TSH, T4TOTAL, FREET4, T3FREE, THYROIDAB in the last 72 hours. Anemia Panel: No results for input(s): VITAMINB12, FOLATE, FERRITIN, TIBC, IRON, RETICCTPCT in the last 72 hours. Urine analysis:    Component Value Date/Time   COLORURINE YELLOW 02/01/2009 1754   APPEARANCEUR CLEAR 02/01/2009 1754   LABSPEC 1.040 (H) 02/01/2009 1754   PHURINE 5.5 02/01/2009 1754   GLUCOSEU >1000 (A) 02/01/2009 1754   HGBUR NEGATIVE 02/01/2009 1754   BILIRUBINUR NEGATIVE 02/01/2009 1754   KETONESUR TRACE (A) 02/01/2009 1754   PROTEINUR 30 (A) 02/01/2009 1754   UROBILINOGEN 1.0 02/01/2009 1754   NITRITE NEGATIVE 02/01/2009 1754   LEUKOCYTESUR NEGATIVE 02/01/2009 1754    Radiological Exams on Admission: Dg Foot Complete Left  Result Date: 11/25/2016 CLINICAL DATA:  Ulcer on lateral side of LEFT foot near little toe excreting pus question osteomyelitis EXAM: LEFT FOOT - COMPLETE 3+ VIEW COMPARISON:  11/22/2016 FINDINGS: Osseous demineralization. Bone destruction at the head of the fifth metatarsal consistent with osteomyelitis. Additional bone destruction at the lateral base of the proximal phalanx of the fifth toe compatible with osteomyelitis and involvement of the fifth MTP joint compatible with septic arthritis. Overlying soft tissue swelling. No additional fracture, dislocation or bone destruction. Bone destruction at the fifth metatarsal head is progressive since the previous exam, as is the destruction at the base of the proximal phalanx  little toe. Tiny plantar calcaneal spur. IMPRESSION: Septic arthritis of the fifth MTP joint with osteomyelitis of the base of the proximal  phalanx little toe and more prominently of the distal LEFT fifth metatarsal. Bone destruction is mildly progressive since 11/22/2016. Electronically Signed   By: Ulyses Southward M.D.   On: 11/25/2016 18:00     Assessment/Plan  Diabetic foot wound left lateral foot, osteomyelitis, concern for septic arthritis - Based on symptoms, would rate this as a moderate infection with 1 risk factor for MRSA - He received 1 dose of clindamycin in the ED - Wound culture ordered, BC X 2 collected and pending - Abx ordered: Ceftriaxone, metronidazole and vancomycin for coverage of possible MRSA and GNR - ABI ordered, pulses were not easily palpated in LLE, consider vascular consult - Ortho consult in the AM - HIV antibody - Prealbumin ordered - Consider [place] of lower extremity, however, Well's score 1 (low risk) and he reports frequent walking and activity at SNF - [place] for pain  Diabetes mellitus without complication  - He reports that his A1C has been well controlled at his PCP office - He has been compliant with insulin and metformin - Continue pregabalin - Check A1C - SSI    Hypothyroidisim - Check TSH - Continue synthroid    Hypertension - BP elevated in the ED this evening, he is only on ramipril  - Start PRN hydralazine - Add second agent as needed to control BP    Depression and Anxiety - Continue home regimen, bupropion, clonazepam, depakote    BPH (benign prostatic hyperplasia) - Continue tamsulosin   Constipation - Colace scheduled - PRN miralax.      DVT prophylaxis: Heparin Code Status: Full Disposition Plan: Admit for IV abx, expected d/c in 2-3 days Consults called: None Admission status: Admit, med Claudette Laws MD [facility] Pager [phone]  If 7PM-7AM, please contact  night-coverage [email or web address] Password [email or web address]  11/25/2016, 8:15 PM

## 2016-11-25 NOTE — ED Notes (Signed)
Pt has taken tylenol for pain, about 4 hours ago.

## 2016-11-25 NOTE — Progress Notes (Signed)
Pharmacy Antibiotic Note  Ralph [name] is a 66 y.o. male admitted on 11/25/2016 with osteomyelitis.  Pharmacy has been consulted for vancomycin dosing.  Plan: Give vancomycin 2000 mg IV x 1 dose Vancomycin 1000 mg IV every 12 hours.  Goal trough 15-20 mcg/mL.  Monitor clinical progress, c/s, renal function, abx plan/LOT VT@SS  as indicated     Temp (24hrs), Avg:97.6 F (36.4 C), Min:97.5 F (36.4 C), Max:97.6 F (36.4 C)   Recent Labs Lab 11/25/16 1410 11/25/16 1425 11/25/16 1752  WBC 7.2  --   --   CREATININE 0.91  --   --   LATICACIDVEN  --  2.01* 1.81    CrCl cannot be calculated (Unknown ideal weight.).    Allergies  Allergen Reactions  . Antihistamines, Chlorpheniramine-Type Other (See Comments)    Per MAR  . Antihistamines, Diphenhydramine-Type Other (See Comments)    Per MAR  . Antihistamines, Loratadine-Type Other (See Comments)    Per MAR  . Benadryl [Diphenhydramine Hcl] Other (See Comments)  . Ethanolamine Other (See Comments)    Per MAR    Antimicrobials this admission: 3/30 Rocephin >>  3/30 Flagyl >>  3/30 Vanc >> 3/30 Clindamycin x 1   Dose adjustments this admission:  Microbiology results: 3/30 BCx: sent 3/30 Foot: sent   Thank you for allowing [place] to participate in this patients care. Signe Colt, PharmD Pager: [phone]

## 2016-11-25 NOTE — ED Provider Notes (Signed)
[facility] DEPT Provider Note   CSN: [id number] Arrival date & time: 11/25/16  1314     History   Chief Complaint Chief Complaint  Patient presents with  . Foot Pain  . Cellulitis    HPI Ralph [name] is a 66 y.o. male.  HPI  66 year old male with history of DM 2 who presents at the request of his PCP for evaluation of left diabetic foot ulcer. Patient states he first noticed the ulcer 3 days ago. It is present to the lateral aspect of his left fifth toe. States that over the last 3 days he's had worsening redness and warmth in the area. Endorses a pins and needle sensation in his foot that is chronic but seems to be worse over the last couple days. States that "they gave me some kind of antibiotic injection in my hip a couple days ago" but does not know what this was. Denies taking oral antibiotics. Denies fevers, chills, nausea, vomiting, cough, shortness of breath, chest pain, abdominal pain. Denies history of osteomyelitis.  Past Medical History:  Diagnosis Date  . Anxiety   . BPH (benign prostatic hyperplasia)   . Depression   . Diabetes mellitus without complication ([place])   . Hypertension   . Thyroid disease    hypothyroid    Patient Active Problem List   Diagnosis Date Noted  . Open wound of left foot 11/25/2016  . Thyroid disease   . Hypertension   . Diabetes mellitus without complication ([place])   . Depression   . BPH (benign prostatic hyperplasia)   . Anxiety     Past Surgical History:  Procedure Laterality Date  . CATARACT EXTRACTION    . EYE SURGERY    . thyroid ablation         Home Medications    Prior to Admission medications   Medication Sig Start Date End Date Taking? Authorizing Provider  acetaminophen (TYLENOL) 325 MG tablet Take 650 mg by mouth every 6 (six) hours as needed for moderate pain.    Yes Historical Provider, MD  aspirin EC 81 MG tablet Take 81 mg by mouth daily.   Yes Historical Provider, MD  buPROPion (WELLBUTRIN XL) 300  MG 24 hr tablet Take 300 mg by mouth daily.   Yes Historical Provider, MD  canagliflozin (INVOKANA) 300 MG TABS tablet Take 300 mg by mouth daily before breakfast.   Yes Historical Provider, MD  clonazePAM (KLONOPIN) 0.5 MG tablet Take 0.5 mg by mouth 2 (two) times daily.   Yes Historical Provider, MD  divalproex (DEPAKOTE ER) 500 MG 24 hr tablet Take 1,000 mg by mouth every morning.   Yes Historical Provider, MD  insulin detemir (LEVEMIR) 100 UNIT/ML injection Inject 26 Units into the skin every morning.   Yes Historical Provider, MD  Lactobacillus Casei-Folic Acid (RESTORA RX) 60-1.25 MG CAPS Take 1 capsule by mouth daily.   Yes Historical Provider, MD  levocetirizine (XYZAL) 5 MG tablet Take 5 mg by mouth every morning.   Yes Historical Provider, MD  levothyroxine (SYNTHROID, LEVOTHROID) 200 MCG tablet Take 200 mcg by mouth daily before breakfast.   Yes Historical Provider, MD  liraglutide (VICTOZA) 18 MG/3ML SOPN Inject 1.8 mg into the skin every morning.   Yes Historical Provider, MD  meloxicam (MOBIC) 7.5 MG tablet Take 7.5 mg by mouth daily.   Yes Historical Provider, MD  metFORMIN (GLUCOPHAGE-XR) 500 MG 24 hr tablet Take 1,000 mg by mouth 2 (two) times daily.   Yes Historical Provider,  MD  Multiple Vitamin (MULTIVITAMIN WITH MINERALS) TABS tablet Take 1 tablet by mouth daily.   Yes Historical Provider, MD  Pancrelipase, Lip-Prot-Amyl, (CREON) 24000-76000 units CPEP Take 3 capsules by mouth 3 (three) times daily with meals. Along with 120000 units of amylase-included in capsules   Yes Historical Provider, MD  Polyethyl Glycol-Propyl Glycol (SYSTANE) 0.4-0.3 % SOLN Apply 1 drop to eye 4 (four) times daily.   Yes Historical Provider, MD  pregabalin (LYRICA) 75 MG capsule Take 75 mg by mouth 3 (three) times daily.   Yes Historical Provider, MD  ramipril (ALTACE) 5 MG capsule Take 5 mg by mouth daily.   Yes Historical Provider, MD  simvastatin (ZOCOR) 40 MG tablet Take 40 mg by mouth at bedtime.    Yes Historical Provider, MD  tamsulosin (FLOMAX) 0.4 MG CAPS capsule Take 0.4 mg by mouth daily after breakfast.   Yes Historical Provider, MD  White Petrolatum-Mineral Oil (SYSTANE NIGHTTIME) OINT Place 1 application into both eyes at bedtime.   Yes Historical Provider, MD    Family History Family History  Problem Relation Age of Onset  . Diabetes Mellitus II Mother   . Heart disease Father   . Lung cancer Father   . Diabetes Mellitus II Paternal Grandfather     Social History Social History  Substance Use Topics  . Smoking status: Former Research scientist (life sciences)  . Smokeless tobacco: Never Used  . Alcohol use No     Allergies   Antihistamines, chlorpheniramine-type; Antihistamines, diphenhydramine-type; Antihistamines, loratadine-type; Benadryl [diphenhydramine hcl]; and Ethanolamine   Review of Systems Review of Systems  Constitutional: Negative for chills and fever.  HENT: Negative for congestion.   Eyes: Negative for visual disturbance.  Respiratory: Negative for cough and shortness of breath.   Cardiovascular: Negative for chest pain.  Gastrointestinal: Negative for abdominal pain, diarrhea, nausea and vomiting.  Genitourinary: Negative for dysuria and frequency.  Musculoskeletal: Positive for arthralgias (L foot). Negative for back pain, neck pain and neck stiffness.  Skin: Positive for wound (L 5th toe).  Neurological: Negative for dizziness, weakness, light-headedness and headaches.  Psychiatric/Behavioral: Negative for agitation, behavioral problems and confusion.     Physical Exam Updated Vital Signs BP (!) 175/74   Pulse 88   Temp 97.6 F (36.4 C) (Oral)   Resp 16   Wt 84.9 kg   SpO2 100%   BMI 25.39 kg/m   Physical Exam  Constitutional: He is oriented to person, place, and time. He appears well-developed and well-nourished. No distress.  HENT:  Head: Normocephalic and atraumatic.  Eyes: Conjunctivae are normal.  Neck: Normal range of motion. Neck supple.    Cardiovascular: Normal rate, regular rhythm, normal heart sounds and intact distal pulses.   No murmur heard. Pulmonary/Chest: Effort normal and breath sounds normal. No respiratory distress.  Abdominal: Soft. Bowel sounds are normal. He exhibits no distension. There is no tenderness. There is no guarding.  Musculoskeletal:  Stage 3 diabetic foot ulcer to the lateral aspect of the base of the L 5th toe. Surrounding erythema that spreads to the mid-foot, with associated edema up to the lower shin. No TTP. Pt has full ROM of the L ankle without pain. No calf tenderness. Full ROM of the toes of the L foot.   Neurological: He is alert and oriented to person, place, and time. He exhibits normal muscle tone.  Decreased sensation to light touch in a sock-like distribution to the bilateral feet.   Skin: Skin is warm and dry. He is not diaphoretic.  Psychiatric: He has a normal mood and affect.  Nursing note and vitals reviewed.    ED Treatments / Results  Labs (all labs ordered are listed, but only abnormal results are displayed) Labs Reviewed  COMPREHENSIVE METABOLIC PANEL - Abnormal; Notable for the following:       Result Value   Glucose, Bld 237 (*)    Calcium 8.5 (*)    Albumin 2.8 (*)    ALT 15 (*)    All other components within normal limits  CBC WITH DIFFERENTIAL/PLATELET - Abnormal; Notable for the following:    RBC 3.75 (*)    Hemoglobin 10.9 (*)    HCT 33.7 (*)    All other components within normal limits  SEDIMENTATION RATE - Abnormal; Notable for the following:    Sed Rate 85 (*)    All other components within normal limits  C-REACTIVE PROTEIN - Abnormal; Notable for the following:    CRP 6.2 (*)    All other components within normal limits  PREALBUMIN - Abnormal; Notable for the following:    Prealbumin 16.5 (*)    All other components within normal limits  CBC - Abnormal; Notable for the following:    RBC 3.72 (*)    Hemoglobin 10.8 (*)    HCT 33.4 (*)    All other  components within normal limits  I-STAT CG4 LACTIC ACID, ED - Abnormal; Notable for the following:    Lactic Acid, Venous 2.01 (*)    All other components within normal limits  CULTURE, BLOOD (ROUTINE X 2)  CULTURE, BLOOD (ROUTINE X 2)  AEROBIC CULTURE (SUPERFICIAL SPECIMEN)  CREATININE, SERUM  TSH  GLUCOSE, CAPILLARY  HEMOGLOBIN A1C  HIV ANTIBODY (ROUTINE TESTING)  I-STAT CG4 LACTIC ACID, ED    EKG  EKG Interpretation None       Radiology Dg Foot Complete Left  Result Date: 11/25/2016 CLINICAL DATA:  Ulcer on lateral side of LEFT foot near little toe excreting pus question osteomyelitis EXAM: LEFT FOOT - COMPLETE 3+ VIEW COMPARISON:  11/22/2016 FINDINGS: Osseous demineralization. Bone destruction at the head of the fifth metatarsal consistent with osteomyelitis. Additional bone destruction at the lateral base of the proximal phalanx of the fifth toe compatible with osteomyelitis and involvement of the fifth MTP joint compatible with septic arthritis. Overlying soft tissue swelling. No additional fracture, dislocation or bone destruction. Bone destruction at the fifth metatarsal head is progressive since the previous exam, as is the destruction at the base of the proximal phalanx little toe. Tiny plantar calcaneal spur. IMPRESSION: Septic arthritis of the fifth MTP joint with osteomyelitis of the base of the proximal phalanx little toe and more prominently of the distal LEFT fifth metatarsal. Bone destruction is mildly progressive since 11/22/2016. Electronically Signed   By: Lavonia Dana M.D.   On: 11/25/2016 18:00    Procedures Procedures (including critical care time)  Medications Ordered in ED Medications  buPROPion (WELLBUTRIN XL) 24 hr tablet 300 mg (not administered)  clonazePAM (KLONOPIN) tablet 0.5 mg (0.5 mg Oral Given 11/25/16 2256)  divalproex (DEPAKOTE ER) 24 hr tablet 1,000 mg (not administered)  insulin detemir (LEVEMIR) injection 26 Units (not administered)    levothyroxine (SYNTHROID, LEVOTHROID) tablet 200 mcg (not administered)  meloxicam (MOBIC) tablet 7.5 mg (not administered)  metFORMIN (GLUCOPHAGE-XR) 24 hr tablet 1,000 mg (not administered)  multivitamin with minerals tablet 1 tablet (not administered)  lipase/protease/amylase (CREON) capsule 24,000 Units (not administered)  pregabalin (LYRICA) capsule 75 mg (75 mg Oral Given 11/25/16 2306)  ramipril (ALTACE) capsule 5 mg (not administered)  simvastatin (ZOCOR) tablet 40 mg (40 mg Oral Given 11/25/16 2306)  tamsulosin (FLOMAX) capsule 0.4 mg (not administered)  cefTRIAXone (ROCEPHIN) 2 g in dextrose 5 % 50 mL IVPB (2 g Intravenous Given 11/25/16 2307)    And  metroNIDAZOLE (FLAGYL) tablet 500 mg (500 mg Oral Given 11/25/16 2306)  heparin injection 5,000 Units (5,000 Units Subcutaneous Given 11/25/16 2306)  HYDROcodone-acetaminophen (NORCO/VICODIN) 5-325 MG per tablet 1-2 tablet (2 tablets Oral Given 11/25/16 2258)  docusate sodium (COLACE) capsule 100 mg (100 mg Oral Given 11/25/16 2306)  polyethylene glycol (MIRALAX / GLYCOLAX) packet 17 g (not administered)  ondansetron (ZOFRAN) tablet 4 mg (not administered)    Or  ondansetron (ZOFRAN) injection 4 mg (not administered)  insulin aspart (novoLOG) injection 0-15 Units (not administered)  insulin aspart (novoLOG) injection 0-5 Units (0 Units Subcutaneous Not Given 11/25/16 2301)  hydrALAZINE (APRESOLINE) tablet 10 mg (not administered)  vancomycin (VANCOCIN) 2,000 mg in sodium chloride 0.9 % 500 mL IVPB (2,000 mg Intravenous Given 11/26/16 0012)  artificial tears (LACRILUBE) ophthalmic ointment ( Both Eyes Given 11/25/16 2308)  vancomycin (VANCOCIN) IVPB 1000 mg/200 mL premix (not administered)  clindamycin (CLEOCIN) IVPB 900 mg (0 mg Intravenous Stopped 11/25/16 1842)  HYDROcodone-acetaminophen (NORCO/VICODIN) 5-325 MG per tablet 1 tablet (1 tablet Oral Given 11/25/16 1758)  sodium chloride 0.9 % bolus 1,000 mL (0 mLs Intravenous Stopped 11/25/16  1842)     Initial Impression / Assessment and Plan / ED Course  I have reviewed the triage vital signs and the nursing notes.  Pertinent labs & imaging results that were available during my care of the patient were reviewed by me and considered in my medical decision making (see chart for details).     Afebrile and hemodynamically stable. Patient has a diabetic foot ulcer to his left lateral fifth toe as described above, with cellulitis and edema to the dorsum of the left foot. X-ray of the left foot obtained with osteomyelitis of the base of the proximal phalanx of the little toe and septic arthritis to the fifth MTP joint. Labs with no leukocytosis. ESR and CRP both mildly elevated. Patient initially had a lactic acidosis to 2.01 that cleared after a liter of IV fluids. Blood cultures obtained and patient given a dose of clindamycin. He'll be admitted to medicine for management of his osteomyelitis.  Care of patient overseen by my attending, Dr. Jeanell Sparrow.  Final Clinical Impressions(s) / ED Diagnoses   Final diagnoses:  Thyroid disease  Essential hypertension  Diabetes mellitus without complication ([place])  Depression, unspecified depression type  Benign prostatic hyperplasia with urinary hesitancy  Anxiety    New Prescriptions Current Discharge Medication List       Zipporah Plants, MD 11/26/16 Lafe, MD 11/26/16 2344

## 2016-11-26 ENCOUNTER — Encounter (HOSPITAL_COMMUNITY)
Admission: EM | Disposition: A | Discharge status: Skilled Nursing Facility | Payer: Self-pay | Source: Home / Self Care | Attending: Family Medicine

## 2016-11-26 ENCOUNTER — Inpatient Hospital Stay (HOSPITAL_COMMUNITY): Payer: Medicare Other | Admitting: Anesthesiology

## 2016-11-26 ENCOUNTER — Encounter (HOSPITAL_COMMUNITY): Payer: Self-pay | Admitting: Certified Registered Nurse Anesthetist

## 2016-11-26 ENCOUNTER — Inpatient Hospital Stay (HOSPITAL_COMMUNITY): Payer: Medicare Other

## 2016-11-26 DIAGNOSIS — E13628 Other specified diabetes mellitus with other skin complications: Secondary | ICD-10-CM

## 2016-11-26 DIAGNOSIS — I10 Essential (primary) hypertension: Secondary | ICD-10-CM

## 2016-11-26 DIAGNOSIS — M009 Pyogenic arthritis, unspecified: Secondary | ICD-10-CM | POA: Diagnosis present

## 2016-11-26 DIAGNOSIS — E11621 Type 2 diabetes mellitus with foot ulcer: Secondary | ICD-10-CM

## 2016-11-26 DIAGNOSIS — I739 Peripheral vascular disease, unspecified: Secondary | ICD-10-CM

## 2016-11-26 DIAGNOSIS — L03119 Cellulitis of unspecified part of limb: Secondary | ICD-10-CM

## 2016-11-26 DIAGNOSIS — M86172 Other acute osteomyelitis, left ankle and foot: Secondary | ICD-10-CM

## 2016-11-26 DIAGNOSIS — I70245 Atherosclerosis of native arteries of left leg with ulceration of other part of foot: Secondary | ICD-10-CM

## 2016-11-26 DIAGNOSIS — E440 Moderate protein-calorie malnutrition: Secondary | ICD-10-CM | POA: Insufficient documentation

## 2016-11-26 HISTORY — PX: AMPUTATION: SHX166

## 2016-11-26 LAB — HIV ANTIBODY (ROUTINE TESTING W REFLEX): HIV Screen 4th Generation wRfx: NONREACTIVE

## 2016-11-26 LAB — GLUCOSE, CAPILLARY
GLUCOSE-CAPILLARY: 87 mg/dL (ref 65–99)
GLUCOSE-CAPILLARY: 192 mg/dL — AB (ref 65–99)
GLUCOSE-CAPILLARY: 108 mg/dL — AB (ref 65–99)
GLUCOSE-CAPILLARY: 134 mg/dL — AB (ref 65–99)
Glucose-Capillary: 148 mg/dL — ABNORMAL HIGH (ref 65–99)
Glucose-Capillary: 66 mg/dL (ref 65–99)
Glucose-Capillary: 88 mg/dL (ref 65–99)

## 2016-11-26 LAB — SURGICAL PCR SCREEN
MRSA, PCR: NEGATIVE
Staphylococcus aureus: NEGATIVE

## 2016-11-26 SURGERY — AMPUTATION, FOOT, RAY
Anesthesia: General | Site: Toe | Laterality: Left

## 2016-11-26 MED ORDER — LACTATED RINGERS IV SOLN
INTRAVENOUS | Status: DC | PRN
  Administered 2016-11-26: 19:00:00 via INTRAVENOUS

## 2016-11-26 MED ORDER — ENSURE ENLIVE PO LIQD
237.0000 mL | Freq: Two times a day (BID) | ORAL | Status: DC
  Administered 2016-11-27 – 2016-11-29 (×4): 237 mL via ORAL

## 2016-11-26 MED ORDER — MIDAZOLAM HCL 2 MG/2ML IJ SOLN
INTRAMUSCULAR | Status: AC
  Filled 2016-11-26: qty 2

## 2016-11-26 MED ORDER — PROPOFOL 10 MG/ML IV BOLUS
INTRAVENOUS | Status: DC | PRN
  Administered 2016-11-26: 160 mg via INTRAVENOUS

## 2016-11-26 MED ORDER — SODIUM CHLORIDE 0.9 % IV SOLN
INTRAVENOUS | Status: DC
  Administered 2016-11-26 – 2016-11-29 (×2): via INTRAVENOUS

## 2016-11-26 MED ORDER — FENTANYL CITRATE (PF) 100 MCG/2ML IJ SOLN
INTRAMUSCULAR | Status: AC
  Administered 2016-11-26: 100 ug via INTRAVENOUS
  Filled 2016-11-26: qty 2

## 2016-11-26 MED ORDER — ACETAMINOPHEN 325 MG PO TABS
650.0000 mg | ORAL_TABLET | Freq: Four times a day (QID) | ORAL | Status: DC | PRN
  Administered 2016-11-30: 650 mg via ORAL
  Filled 2016-11-26: qty 2

## 2016-11-26 MED ORDER — MIDAZOLAM HCL 2 MG/2ML IJ SOLN
INTRAMUSCULAR | Status: AC
  Administered 2016-11-26: 2 mg via INTRAVENOUS
  Filled 2016-11-26: qty 2

## 2016-11-26 MED ORDER — OXYCODONE HCL 5 MG/5ML PO SOLN: 5.0000 mg | Freq: Once | ORAL | Status: DC | PRN

## 2016-11-26 MED ORDER — OXYCODONE HCL 5 MG PO TABS: 5.0000 mg | ORAL_TABLET | ORAL | Status: DC | PRN

## 2016-11-26 MED ORDER — 0.9 % SODIUM CHLORIDE (POUR BTL) OPTIME
TOPICAL | Status: DC | PRN
  Administered 2016-11-26: 1000 mL

## 2016-11-26 MED ORDER — ONDANSETRON HCL 4 MG PO TABS: 4.0000 mg | ORAL_TABLET | Freq: Four times a day (QID) | ORAL | Status: DC | PRN

## 2016-11-26 MED ORDER — ONDANSETRON HCL 4 MG/2ML IJ SOLN: 4.0000 mg | Freq: Four times a day (QID) | INTRAMUSCULAR | Status: DC | PRN

## 2016-11-26 MED ORDER — LIDOCAINE 2% (20 MG/ML) 5 ML SYRINGE
INTRAMUSCULAR | Status: AC
Start: 2016-11-26 — End: 2016-11-26
  Filled 2016-11-26: qty 5

## 2016-11-26 MED ORDER — POLYETHYLENE GLYCOL 3350 17 G PO PACK: 17.0000 g | PACK | Freq: Every day | ORAL | Status: DC | PRN

## 2016-11-26 MED ORDER — MORPHINE SULFATE (PF) 2 MG/ML IV SOLN: 2.0000 mg | INTRAVENOUS | Status: DC | PRN

## 2016-11-26 MED ORDER — DEXTROSE 50 % IV SOLN
INTRAVENOUS | Status: AC
Start: 2016-11-26 — End: 2016-11-26
  Administered 2016-11-26: 25 mL
  Filled 2016-11-26: qty 50

## 2016-11-26 MED ORDER — FENTANYL CITRATE (PF) 100 MCG/2ML IJ SOLN
100.0000 ug | Freq: Once | INTRAMUSCULAR | Status: AC
  Administered 2016-11-26: 100 ug via INTRAVENOUS

## 2016-11-26 MED ORDER — METOCLOPRAMIDE HCL 5 MG PO TABS
5.0000 mg | ORAL_TABLET | Freq: Three times a day (TID) | ORAL | Status: DC | PRN
Start: 2016-11-26 — End: 2016-11-30

## 2016-11-26 MED ORDER — ONDANSETRON HCL 4 MG/2ML IJ SOLN
INTRAMUSCULAR | Status: AC
  Filled 2016-11-26: qty 2

## 2016-11-26 MED ORDER — ONDANSETRON HCL 4 MG/2ML IJ SOLN
INTRAMUSCULAR | Status: DC | PRN
  Administered 2016-11-26: 4 mg via INTRAVENOUS

## 2016-11-26 MED ORDER — FENTANYL CITRATE (PF) 250 MCG/5ML IJ SOLN
INTRAMUSCULAR | Status: DC | PRN
  Administered 2016-11-26: 100 ug via INTRAVENOUS

## 2016-11-26 MED ORDER — PROPOFOL 10 MG/ML IV BOLUS
INTRAVENOUS | Status: AC
  Filled 2016-11-26: qty 20

## 2016-11-26 MED ORDER — OXYCODONE HCL 5 MG PO TABS: 5.0000 mg | ORAL_TABLET | Freq: Once | ORAL | Status: DC | PRN

## 2016-11-26 MED ORDER — LACTATED RINGERS IV SOLN
INTRAVENOUS | Status: DC
  Administered 2016-11-26: 17:00:00 via INTRAVENOUS

## 2016-11-26 MED ORDER — DEXTROSE-NACL 5-0.45 % IV SOLN
INTRAVENOUS | Status: DC
  Administered 2016-11-26: 10:00:00 via INTRAVENOUS

## 2016-11-26 MED ORDER — FENTANYL CITRATE (PF) 100 MCG/2ML IJ SOLN: 25.0000 ug | INTRAMUSCULAR | Status: DC | PRN

## 2016-11-26 MED ORDER — METOCLOPRAMIDE HCL 5 MG/ML IJ SOLN: 5.0000 mg | Freq: Three times a day (TID) | INTRAMUSCULAR | Status: DC | PRN

## 2016-11-26 MED ORDER — LIDOCAINE HCL (CARDIAC) 20 MG/ML IV SOLN
INTRAVENOUS | Status: DC | PRN
  Administered 2016-11-26: 100 mg via INTRAVENOUS

## 2016-11-26 MED ORDER — ONDANSETRON HCL 4 MG/2ML IJ SOLN: 4.0000 mg | Freq: Once | INTRAMUSCULAR | Status: DC | PRN

## 2016-11-26 MED ORDER — MIDAZOLAM HCL 2 MG/2ML IJ SOLN
2.0000 mg | Freq: Once | INTRAMUSCULAR | Status: AC
  Administered 2016-11-26: 2 mg via INTRAVENOUS

## 2016-11-26 MED ORDER — CHLORHEXIDINE GLUCONATE 4 % EX LIQD
60.0000 mL | Freq: Once | CUTANEOUS | Status: AC
  Administered 2016-11-26: 4 via TOPICAL
  Filled 2016-11-26: qty 15

## 2016-11-26 MED ORDER — SODIUM CHLORIDE 0.9 % IR SOLN
Status: DC | PRN
  Administered 2016-11-26: 3000 mL

## 2016-11-26 MED ORDER — ACETAMINOPHEN 650 MG RE SUPP: 650.0000 mg | Freq: Four times a day (QID) | RECTAL | Status: DC | PRN

## 2016-11-26 MED ORDER — FENTANYL CITRATE (PF) 250 MCG/5ML IJ SOLN
INTRAMUSCULAR | Status: AC
  Filled 2016-11-26: qty 5

## 2016-11-26 MED ORDER — PIPERACILLIN-TAZOBACTAM 3.375 G IVPB
3.3750 g | Freq: Three times a day (TID) | INTRAVENOUS | Status: DC
  Administered 2016-11-26 – 2016-11-29 (×8): 3.375 g via INTRAVENOUS
  Filled 2016-11-26 (×12): qty 50

## 2016-11-26 MED ORDER — DOCUSATE SODIUM 100 MG PO CAPS
100.0000 mg | ORAL_CAPSULE | Freq: Two times a day (BID) | ORAL | Status: DC
  Administered 2016-11-26 – 2016-11-30 (×7): 100 mg via ORAL
  Filled 2016-11-26 (×8): qty 1

## 2016-11-26 SURGICAL SUPPLY — 56 items
BANDAGE ESMARK 6X9 LF (GAUZE/BANDAGES/DRESSINGS) ×1 IMPLANT
BLADE SAW RECIP 87.9 MT (BLADE) ×3 IMPLANT
BNDG CMPR 9X6 STRL LF SNTH (GAUZE/BANDAGES/DRESSINGS) ×1
BNDG COHESIVE 4X5 TAN STRL (GAUZE/BANDAGES/DRESSINGS) ×2 IMPLANT
BNDG COHESIVE 6X5 TAN STRL LF (GAUZE/BANDAGES/DRESSINGS) ×4 IMPLANT
BNDG ESMARK 6X9 LF (GAUZE/BANDAGES/DRESSINGS) ×3
BNDG GAUZE ELAST 4 BULKY (GAUZE/BANDAGES/DRESSINGS) ×2 IMPLANT
BNDG GAUZE STRTCH 6 (GAUZE/BANDAGES/DRESSINGS) ×4 IMPLANT
COTTON STERILE ROLL (GAUZE/BANDAGES/DRESSINGS) ×1 IMPLANT
COVER SURGICAL LIGHT HANDLE (MISCELLANEOUS) ×3 IMPLANT
CUFF TOURNIQUET SINGLE 34IN LL (TOURNIQUET CUFF) IMPLANT
DRAIN PENROSE 1/2X12 LTX STRL (WOUND CARE) IMPLANT
DRAPE HALF SHEET 40X57 (DRAPES) ×3 IMPLANT
DRAPE ORTHO SPLIT 77X108 STRL (DRAPES) ×6
DRAPE PROXIMA HALF (DRAPES) ×3 IMPLANT
DRAPE SURG ORHT 6 SPLT 77X108 (DRAPES) IMPLANT
DRAPE U-SHAPE 47X51 STRL (DRAPES) ×3 IMPLANT
DRSG ADAPTIC 3X8 NADH LF (GAUZE/BANDAGES/DRESSINGS) ×3 IMPLANT
DURAPREP 26ML APPLICATOR (WOUND CARE) ×3 IMPLANT
ELECT CAUTERY BLADE 6.4 (BLADE) IMPLANT
ELECT REM PT RETURN 9FT ADLT (ELECTROSURGICAL) ×3
ELECTRODE REM PT RTRN 9FT ADLT (ELECTROSURGICAL) ×1 IMPLANT
GAUZE SPONGE 4X4 12PLY STRL (GAUZE/BANDAGES/DRESSINGS) ×3 IMPLANT
GLOVE BIOGEL PI ORTHO PRO 7.5 (GLOVE) ×2
GLOVE BIOGEL PI ORTHO PRO SZ8 (GLOVE) ×2
GLOVE ORTHO TXT STRL SZ7.5 (GLOVE) ×3 IMPLANT
GLOVE PI ORTHO PRO STRL 7.5 (GLOVE) ×1 IMPLANT
GLOVE PI ORTHO PRO STRL SZ8 (GLOVE) ×1 IMPLANT
GLOVE SURG ORTHO 8.5 STRL (GLOVE) ×3 IMPLANT
GOWN STRL REUS W/ TWL LRG LVL3 (GOWN DISPOSABLE) ×1 IMPLANT
GOWN STRL REUS W/ TWL XL LVL3 (GOWN DISPOSABLE) ×2 IMPLANT
GOWN STRL REUS W/TWL LRG LVL3 (GOWN DISPOSABLE) ×3
GOWN STRL REUS W/TWL XL LVL3 (GOWN DISPOSABLE) ×6
KIT BASIN OR (CUSTOM PROCEDURE TRAY) ×3 IMPLANT
KIT ROOM TURNOVER OR (KITS) ×3 IMPLANT
MANIFOLD NEPTUNE II (INSTRUMENTS) ×1 IMPLANT
NS IRRIG 1000ML POUR BTL (IV SOLUTION) ×3 IMPLANT
PACK GENERAL/GYN (CUSTOM PROCEDURE TRAY) ×3 IMPLANT
PAD ARMBOARD 7.5X6 YLW CONV (MISCELLANEOUS) ×6 IMPLANT
PAD CAST 4YDX4 CTTN HI CHSV (CAST SUPPLIES) ×1 IMPLANT
PADDING CAST COTTON 4X4 STRL (CAST SUPPLIES)
PADDING CAST COTTON 6X4 STRL (CAST SUPPLIES) ×3 IMPLANT
SPECIMEN JAR MEDIUM (MISCELLANEOUS) ×2 IMPLANT
SPONGE LAP 18X18 X RAY DECT (DISPOSABLE) IMPLANT
STAPLER VISISTAT 35W (STAPLE) IMPLANT
STOCKINETTE IMPERVIOUS 9X36 MD (GAUZE/BANDAGES/DRESSINGS) ×2 IMPLANT
SUT ETHILON 2 0 PSLX (SUTURE) ×4 IMPLANT
SUT PDS AB 1 CT  36 (SUTURE)
SUT PDS AB 1 CT 36 (SUTURE) IMPLANT
SUT SILK 2 0 (SUTURE) ×6
SUT SILK 2-0 18XBRD TIE 12 (SUTURE) ×1 IMPLANT
SWAB CULTURE ESWAB REG 1ML (MISCELLANEOUS) ×2 IMPLANT
TOWEL OR 17X24 6PK STRL BLUE (TOWEL DISPOSABLE) ×3 IMPLANT
TOWEL OR 17X26 10 PK STRL BLUE (TOWEL DISPOSABLE) ×3 IMPLANT
TUBE ANAEROBIC SPECIMEN COL (MISCELLANEOUS) IMPLANT
WATER STERILE IRR 1000ML POUR (IV SOLUTION) ×3 IMPLANT

## 2016-11-26 NOTE — Progress Notes (Signed)
Initial Nutrition Assessment  DOCUMENTATION CODES:   Non-severe (moderate) malnutrition in context of chronic illness  INTERVENTION:  -Recommend Ensure Enlive po BID, each supplement provides 350 kcal and 20 grams of protein, until pt demonstrating adequate po intake as in patient  -Reviewed basics of diabetic diet with pt; pt report he understands diet well and has no questions. Reinforced importance of adequate protein and managing diabetes well with regards to wound healing. Pt reports able to report good sources of protein and reports he eats protein at each meal. HgbA1c pending. Will review/reinforce diet on follow-up   NUTRITION DIAGNOSIS:   Malnutrition (Moderate) related to chronic illness as evidenced by mild depletion of body fat, moderate depletion of body fat, mild depletion of muscle mass, moderate depletions of muscle mass.  GOAL:   Patient will meet greater than or equal to 90% of their needs  MONITOR:   PO intake, Supplement acceptance, Labs, Weight trends  REASON FOR ASSESSMENT:   Consult Wound healing  ASSESSMENT:    66 yo male admitted with diabetic foot wound-left lateral foot, osteomyelitis, concern for septic arthritis; pt with hx of DM without complication, anxiety/depression, HTN. Per chart review, pt with hx of chronic schizophrenia, paranoid type  Plan is for 5th toe amputation and ray resection  Pt reports he has a good appetite and eats 3 meals per day plus 3 snacks per day. Pt reports he is a variety of foods and listed all types of foods to RD but could unable to tell writer specifically what he typically eats in a 24 hour period.  Pt is unsure if he has lost any weight recently and limited weight encounters in computer do not indicate any significant wt loss Pt NPO at present for procedure  Nutrition-Focused physical exam completed. Findings are mild/moderate fat depletion, mild/moderate muscle depletion, and no edema (swelling is present on left  foot where wound is located).   Labs: CRP 6.2 (H), CBGs wdl (80s), HgbA1c pending No results found for: HGBA1C  Meds: ss novolog, levemir, creon, MVI with minerals, glucophage  Diet Order:  Diet NPO time specified Diet NPO time specified Except for: Sips with Meds  Skin:  Wound (see comment) (diabetic foot ulcer, osteomyelitis)  Last BM:  11/18/16  Height:   Ht Readings from Last 1 Encounters:  11/26/16 6' (1.829 m)    Weight:   Wt Readings from Last 1 Encounters:  11/26/16 187 lb (84.8 kg)    BMI:  Body mass index is 25.36 kg/m.  Estimated Nutritional Needs:   Kcal:  2100-2400 kcals  Protein:  105-128 g  Fluid:  >/= 2 L  EDUCATION NEEDS:   Education needs addressed  Romelle Starcher MS, RD, LDN [phone] Pager  [phone] Weekend/On-Call Pager

## 2016-11-26 NOTE — Consult Note (Signed)
Reason for Consult:left foot osteo Referring Physician: Grunz  Ralph [name] is an 66 y.o. male.  HPI: 66 yo diabetic male who presents with a several week history of increasing pain and swelling over the lateral left foot.  Patient noted to have increased drainage this week and presented last night to the [facility] ED for eval.  XRAYs in the ED showed osteo and evidence of septic arthritis. Ortho consulted   Past Medical History:  Diagnosis Date  . Anxiety   . BPH (benign prostatic hyperplasia)   . Depression   . Diabetes mellitus without complication ([facility])   . Hypertension   . Thyroid disease    hypothyroid    Past Surgical History:  Procedure Laterality Date  . CATARACT EXTRACTION    . EYE SURGERY    . thyroid ablation      Family History  Problem Relation Age of Onset  . Diabetes Mellitus II Mother   . Heart disease Father   . Lung cancer Father   . Diabetes Mellitus II Paternal Grandfather     Social History:  reports that he has quit smoking. He has never used smokeless tobacco. He reports that he does not drink alcohol or use drugs.  Allergies:  Allergies  Allergen Reactions  . Antihistamines, Chlorpheniramine-Type Other (See Comments)    Per MAR  . Antihistamines, Diphenhydramine-Type Other (See Comments)    Per MAR  . Antihistamines, Loratadine-Type Other (See Comments)    Per MAR  . Benadryl [Diphenhydramine Hcl] Other (See Comments)  . Ethanolamine Other (See Comments)    Per MAR    Medications: I have reviewed the patient's current medications.  Results for orders placed or performed during the hospital encounter of 11/25/16 (from the past 48 hour(s))  Comprehensive metabolic panel     Status: Abnormal   Collection Time: 11/25/16  2:10 PM  Result Value Ref Range   Sodium 136 135 - 145 mmol/L   Potassium 4.0 3.5 - 5.1 mmol/L   Chloride 101 101 - 111 mmol/L   CO2 28 22 - 32 mmol/L   Glucose, Bld 237 (H) 65 - 99 mg/dL   BUN 11 6 - 20 mg/dL   Creatinine,  Ser 0.91 0.61 - 1.24 mg/dL   Calcium 8.5 (L) 8.9 - 10.3 mg/dL   Total Protein 6.5 6.5 - 8.1 g/dL   Albumin 2.8 (L) 3.5 - 5.0 g/dL   AST 15 15 - 41 U/L   ALT 15 (L) 17 - 63 U/L   Alkaline Phosphatase 59 38 - 126 U/L   Total Bilirubin 0.6 0.3 - 1.2 mg/dL   GFR calc non Af Amer >60 >60 mL/min   GFR calc Af Amer >60 >60 mL/min    Comment: (NOTE) The eGFR has been calculated using the CKD EPI equation. This calculation has not been validated in all clinical situations. eGFR's persistently <60 mL/min signify possible Chronic Kidney Disease.    Anion gap 7 5 - 15  CBC with Differential     Status: Abnormal   Collection Time: 11/25/16  2:10 PM  Result Value Ref Range   WBC 7.2 4.0 - 10.5 K/uL   RBC 3.75 (L) 4.22 - 5.81 MIL/uL   Hemoglobin 10.9 (L) 13.0 - 17.0 g/dL   HCT 33.7 (L) 39.0 - 52.0 %   MCV 89.9 78.0 - 100.0 fL   MCH 29.1 26.0 - 34.0 pg   MCHC 32.3 30.0 - 36.0 g/dL   RDW 12.5 11.5 - 15.5 %     Platelets 376 150 - 400 K/uL   Neutrophils Relative % 63 %   Neutro Abs 4.6 1.7 - 7.7 K/uL   Lymphocytes Relative 26 %   Lymphs Abs 1.8 0.7 - 4.0 K/uL   Monocytes Relative 9 %   Monocytes Absolute 0.6 0.1 - 1.0 K/uL   Eosinophils Relative 2 %   Eosinophils Absolute 0.1 0.0 - 0.7 K/uL   Basophils Relative 0 %   Basophils Absolute 0.0 0.0 - 0.1 K/uL  I-Stat CG4 Lactic Acid, ED     Status: Abnormal   Collection Time: 11/25/16  2:25 PM  Result Value Ref Range   Lactic Acid, Venous 2.01 (HH) 0.5 - 1.9 mmol/L   Comment NOTIFIED PHYSICIAN   Sedimentation rate     Status: Abnormal   Collection Time: 11/25/16  5:35 PM  Result Value Ref Range   Sed Rate 85 (H) 0 - 16 mm/hr  C-reactive protein     Status: Abnormal   Collection Time: 11/25/16  5:35 PM  Result Value Ref Range   CRP 6.2 (H) <1.0 mg/dL  I-Stat CG4 Lactic Acid, ED     Status: None   Collection Time: 11/25/16  5:52 PM  Result Value Ref Range   Lactic Acid, Venous 1.81 0.5 - 1.9 mmol/L  Prealbumin     Status: Abnormal    Collection Time: 11/25/16 10:12 PM  Result Value Ref Range   Prealbumin 16.5 (L) 18 - 38 mg/dL  CBC     Status: Abnormal   Collection Time: 11/25/16 10:12 PM  Result Value Ref Range   WBC 7.7 4.0 - 10.5 K/uL   RBC 3.72 (L) 4.22 - 5.81 MIL/uL   Hemoglobin 10.8 (L) 13.0 - 17.0 g/dL   HCT 33.4 (L) 39.0 - 52.0 %   MCV 89.8 78.0 - 100.0 fL   MCH 29.0 26.0 - 34.0 pg   MCHC 32.3 30.0 - 36.0 g/dL   RDW 12.5 11.5 - 15.5 %   Platelets 375 150 - 400 K/uL  Creatinine, serum     Status: None   Collection Time: 11/25/16 10:12 PM  Result Value Ref Range   Creatinine, Ser 0.74 0.61 - 1.24 mg/dL   GFR calc non Af Amer >60 >60 mL/min   GFR calc Af Amer >60 >60 mL/min    Comment: (NOTE) The eGFR has been calculated using the CKD EPI equation. This calculation has not been validated in all clinical situations. eGFR's persistently <60 mL/min signify possible Chronic Kidney Disease.   TSH     Status: None   Collection Time: 11/25/16 10:12 PM  Result Value Ref Range   TSH 2.565 0.350 - 4.500 uIU/mL    Comment: Performed by a 3rd Generation assay with a functional sensitivity of <=0.01 uIU/mL.  Glucose, capillary     Status: None   Collection Time: 11/25/16 10:44 PM  Result Value Ref Range   Glucose-Capillary 81 65 - 99 mg/dL  Aerobic Culture (superficial specimen)     Status: None (Preliminary result)   Collection Time: 11/26/16  3:14 AM  Result Value Ref Range   Specimen Description WOUND    Special Requests LEFT FOOT    Gram Stain      NO WBC SEEN RARE GRAM POSITIVE COCCI IN PAIRS RARE SQUAMOUS EPITHELIAL CELLS PRESENT    Culture PENDING    Report Status PENDING   Glucose, capillary     Status: None   Collection Time: 11/26/16  7:37 AM  Result Value Ref Range     Glucose-Capillary 87 65 - 99 mg/dL  Glucose, capillary     Status: Abnormal   Collection Time: 11/26/16  9:58 AM  Result Value Ref Range   Glucose-Capillary 192 (H) 65 - 99 mg/dL    Dg Foot Complete Left  Result Date:  11/25/2016 CLINICAL DATA:  Ulcer on lateral side of LEFT foot near little toe excreting pus question osteomyelitis EXAM: LEFT FOOT - COMPLETE 3+ VIEW COMPARISON:  11/22/2016 FINDINGS: Osseous demineralization. Bone destruction at the head of the fifth metatarsal consistent with osteomyelitis. Additional bone destruction at the lateral base of the proximal phalanx of the fifth toe compatible with osteomyelitis and involvement of the fifth MTP joint compatible with septic arthritis. Overlying soft tissue swelling. No additional fracture, dislocation or bone destruction. Bone destruction at the fifth metatarsal head is progressive since the previous exam, as is the destruction at the base of the proximal phalanx little toe. Tiny plantar calcaneal spur. IMPRESSION: Septic arthritis of the fifth MTP joint with osteomyelitis of the base of the proximal phalanx little toe and more prominently of the distal LEFT fifth metatarsal. Bone destruction is mildly progressive since 11/22/2016. Electronically Signed   By: Mark  Boles M.D.   On: 11/25/2016 18:00    ROS Blood pressure (!) 171/76, pulse 82, temperature 97.5 F (36.4 C), temperature source Oral, resp. rate 16, weight 84.9 kg (187 lb 3.2 oz), SpO2 99 %. Physical Exam Healthy appearing male in NAD Left foot with 1 cm draining ulcer over the 5th MTP joint with surrounding erythema and edema below the knee Neg Homan's  Assessment/Plan: Left foot osteo and 5th MTP septic arthritis,  Discussed with patient and recommended 5th toe amp and ray resection to eliminate deep infected tissue.  He agrees with this plan.  Dalessandro Baldyga,STEVEN R 11/26/2016, 11:21 AM     

## 2016-11-26 NOTE — Anesthesia Procedure Notes (Addendum)
Anesthesia Regional Block: Popliteal block   Pre-Anesthetic Checklist: ,, timeout performed, Correct Patient, Correct Site, Correct Laterality, Correct Procedure, Correct Position, site marked, Risks and benefits discussed,  Surgical consent,  Pre-op evaluation,  At surgeon's request and post-op pain management  Laterality: Left  Prep: chloraprep       Needles:  Injection technique: Single-shot  Needle Type: Stimulator Needle - 80          Additional Needles:   Procedures: Doppler guided, Ultrasound guided,,,,,,  Narrative:  Start time: 11/26/2016 6:27 PM End time: 11/26/2016 6:30 PM Injection made incrementally with aspirations every 5 mL.  Performed by: Personally   Additional Notes: 25 cc 0.5% Bupivacaine with 1:200 epi injected easily

## 2016-11-26 NOTE — Anesthesia Preprocedure Evaluation (Addendum)
Anesthesia Evaluation  Patient identified by MRN, date of birth, ID band Patient awake    Reviewed: Allergy & Precautions, NPO status , Patient's Chart, lab work & pertinent test results  Airway Mallampati: II  TM Distance: >3 FB Neck ROM: Full    Dental  (+) Edentulous Upper, Dental Advisory Given, Poor Dentition   Pulmonary former smoker,    breath sounds clear to auscultation       Cardiovascular hypertension,  Rhythm:Regular Rate:Normal     Neuro/Psych    GI/Hepatic   Endo/Other  diabetes  Renal/GU      Musculoskeletal   Abdominal   Peds  Hematology   Anesthesia Other Findings   Reproductive/Obstetrics                            Anesthesia Physical Anesthesia Plan  ASA: III  Anesthesia Plan: General and Regional   Post-op Pain Management:    Induction: Intravenous  Airway Management Planned: LMA  Additional Equipment:   Intra-op Plan:   Post-operative Plan:   Informed Consent: I have reviewed the patients History and Physical, chart, labs and discussed the procedure including the risks, benefits and alternatives for the proposed anesthesia with the patient or authorized representative who has indicated his/her understanding and acceptance.   Dental advisory given  Plan Discussed with: CRNA and Anesthesiologist  Anesthesia Plan Comments:         Anesthesia Quick Evaluation

## 2016-11-26 NOTE — Op Note (Signed)
Ralph [name], Ralph [name] NO.:  [id number]  MEDICAL RECORD NO.:  [id number]  LOCATION:  [facility]                         FACILITY:  [facility]  PHYSICIAN:  Almedia Balls. Ranell Patrick, M.D. DATE OF BIRTH:  22-Jun-1951  DATE OF PROCEDURE:  11/25/2016 DATE OF DISCHARGE:                              OPERATIVE REPORT   PREOPERATIVE DIAGNOSIS:  Osteomyelitis, fifth toe, left foot.  POSTOPERATIVE DIAGNOSIS:  Osteomyelitis, fifth toe, left foot, as well as septic arthritis, metatarsophalangeal joint, fifth toe.  PROCEDURE PERFORMED:  Left foot fifth toe ray amputation.  ATTENDING SURGEON:  Almedia Balls. Ranell Patrick, MD.  ASSISTANT:  None.  ANESTHESIA:  General anesthesia was used.  ESTIMATED BLOOD LOSS:  Minimal.  FLUID REPLACEMENT:  1000 mL crystalloid.  INSTRUMENT COUNT:  Correct.  COMPLICATIONS:  None.  ANTIBIOTICS:  Perioperative antibiotics given.  INDICATIONS:  The patient is a 66 year old male with diabetes, who has had progressive pain and swelling in his foot for the last 2 weeks.  He presented to the hospital yesterday for acute drainage and pain coming from an ulcer on the lateral aspect of his left foot over the fifth metatarsophalangeal joint.  The patient admitted, x-rays obtained by the Internal Medicine service indicating osteomyelitis of the fifth toe as well as MTP joint septic arthritis and destruction of that joint.  I discussed with the patient his situation, he fully understands.  The patient is a ward of the state and the patient fully is alert and understands his predicament.  I discussed the need to perform an amputation of the fifth toe and a ray resection.  The patient understands and totally agrees with that plan to eliminate infection and prevent this from getting worse.  We also contacted the patient's supervisor or caregiver with the state and they understand as well the situation and concur with the surgical plan.  Risks and benefits were discussed.   Informed consent obtained.  DESCRIPTION OF PROCEDURE:  After an adequate level of anesthesia was achieved, the patient was positioned supine on the operating room table. A nonsterile tourniquet placed on left proximal calf.  Left leg elevated and once time-out was verified and called and sterile prep and drape had been performed, we elevated the tourniquet to 300 mmHg.  We did a hockey for a tennis racquet type incision over the lateral aspect of the foot encompassing the ulceration as well as the fifth toe.  The fifth toe was removed sharply using a knife.  We excised the toe in its entirety all the way back to the metatarsal.  We skeletonized the metatarsal and then performed an oblique cut, bevelling that laterally so that there would be no pressure point.  Once we made the cut with a reciprocating saw, we used a bone rasp to rasp down that cut and make it the osteotomy site nice and smooth.  We then irrigated thoroughly with multiple 2 L of normal saline irrigation and pulse lavage.  Once we irrigated, we deflated the tourniquet.  Achieved hemostasis with Bovie electrocautery. At this point, the wound bed looked clean.  We then closed in interrupted nylon closure with horizontal and vertical mattress sutures as well as suture.  A sterile compressive bandage was applied.  The patient transported to the recovery room in stable condition.     Almedia Balls. Ranell Patrick, M.D.     SRN/MEDQ  D:  11/26/2016  T:  11/26/2016  Job:  [id number]

## 2016-11-26 NOTE — Progress Notes (Signed)
Pharmacy Antibiotic Note  Ralph [name] is a 66 y.o. male admitted on 11/25/2016 with wound infection.  Pharmacy has been consulted for Zosyn and vancomycin dosing.  Plan: Start Zosyn 3.375 gm IV q8h (4 hour infusion) Continue vancomycin 1g IV Q12h Monitor clinical picture, renal function, VT prn F/U C&S, abx deescalation / LOT  Weight: 187 lb 3.2 oz (84.9 kg)  Temp (24hrs), Avg:97.5 F (36.4 C), Min:97.5 F (36.4 C), Max:97.6 F (36.4 C)   Recent Labs Lab 11/25/16 1410 11/25/16 1425 11/25/16 1752 11/25/16 2212  WBC 7.2  --   --  7.7  CREATININE 0.91  --   --  0.74  LATICACIDVEN  --  2.01* 1.81  --     Estimated Creatinine Clearance: 101 mL/min (by C-G formula based on SCr of 0.74 mg/dL).    Allergies  Allergen Reactions  . Antihistamines, Chlorpheniramine-Type Other (See Comments)    Per MAR  . Antihistamines, Diphenhydramine-Type Other (See Comments)    Per MAR  . Antihistamines, Loratadine-Type Other (See Comments)    Per MAR  . Benadryl [Diphenhydramine Hcl] Other (See Comments)  . Ethanolamine Other (See Comments)    Per MAR    Antimicrobials this admission: Clindamycin3/30 x 1  Rocephin 3/30 >> 3/31 Flagyl 3/30 >> 3/31 3/30 Vanc >> Zosyn 3/31 >>  Dose adjustments this admission:  Microbiology results: 3/30 BCx: sent 3/30 Foot: Rare GPC in pairs (pending)  Thank you for allowing pharmacy to be a part of this patient's care.  Enzo Bi, PharmD, BCPS Clinical Pharmacist Pager [phone] 11/26/2016 9:48 AM

## 2016-11-26 NOTE — Consult Note (Signed)
  Referred by:  Dr. Grunz ([facility])  Reason for referral: BLE PAD   History of Present Illness  Ralph [name] is a 65 y.o. (09/17/1950) male diabetic who presents with chief complaint: left foot wound.  Onset of symptom occurred >2 weeks ago.  The patient is unaware of the chronology as he is insensate in the Left foot.  Reportedly an aide at this patient's SNF notes drainage from his left foot from a blister overlying his left 5th toe and suggested he get evaluated.  Patient notes neuropathic pain that is random without obvious trigger, severeity is mod-severe.  Pt denies any fever or chills.  Patient has not attempted to treat this wound as he wasn't even aware of it.  Prior to developing this blister, the patient denies any intermittent claudication or rest pain.  He is mostly sedentary but does walk some.  Atherosclerotic risk factors include: DM, HTN, and prior smoker.  Past Medical History:  Diagnosis Date  . Anxiety   . BPH (benign prostatic hyperplasia)   . Depression   . Diabetes mellitus without complication (HCC)   . Hypertension   . Thyroid disease    hypothyroid    Past Surgical History:  Procedure Laterality Date  . CATARACT EXTRACTION    . EYE SURGERY    . thyroid ablation      Social History   Social History  . Marital status: Married    Spouse name: N/A  . Number of children: N/A  . Years of education: N/A   Occupational History  . Not on file.   Social History Main Topics  . Smoking status: Former Smoker  . Smokeless tobacco: Never Used  . Alcohol use No  . Drug use: No  . Sexual activity: Not on file   Other Topics Concern  . Not on file   Social History Narrative  . No narrative on file    Family History  Problem Relation Age of Onset  . Diabetes Mellitus II Mother   . Heart disease Father   . Lung cancer Father   . Diabetes Mellitus II Paternal Grandfather     Current Facility-Administered Medications  Medication Dose  Route Frequency Provider Last Rate Last Dose  . artificial tears (LACRILUBE) ophthalmic ointment   Both Eyes QHS Emily B Mullen, MD      . buPROPion (WELLBUTRIN XL) 24 hr tablet 300 mg  300 mg Oral Daily Emily B Mullen, MD      . clonazePAM (KLONOPIN) tablet 0.5 mg  0.5 mg Oral BID Emily B Mullen, MD   0.5 mg at 11/25/16 2256  . dextrose 5 %-0.45 % sodium chloride infusion   Intravenous Continuous Ryan B Grunz, MD 125 mL/hr at 11/26/16 0939    . divalproex (DEPAKOTE ER) 24 hr tablet 1,000 mg  1,000 mg Oral Daily Emily B Mullen, MD      . docusate sodium (COLACE) capsule 100 mg  100 mg Oral BID Emily B Mullen, MD   100 mg at 11/25/16 2306  . heparin injection 5,000 Units  5,000 Units Subcutaneous Q8H Emily B Mullen, MD   5,000 Units at 11/26/16 0657  . hydrALAZINE (APRESOLINE) tablet 10 mg  10 mg Oral Q8H PRN Emily B Mullen, MD      . HYDROcodone-acetaminophen (NORCO/VICODIN) 5-325 MG per tablet 1-2 tablet  1-2 tablet Oral Q4H PRN Emily B Mullen, MD   2 tablet at 11/25/16 2258  . insulin aspart (novoLOG) injection 0-15 Units    0-15 Units Subcutaneous TID WC Emily B Mullen, MD      . insulin aspart (novoLOG) injection 0-5 Units  0-5 Units Subcutaneous QHS Emily B Mullen, MD      . insulin detemir (LEVEMIR) injection 26 Units  26 Units Subcutaneous BH-q7a Emily B Mullen, MD   26 Units at 11/26/16 0739  . levothyroxine (SYNTHROID, LEVOTHROID) tablet 200 mcg  200 mcg Oral QAC breakfast Emily B Mullen, MD   200 mcg at 11/26/16 0743  . lipase/protease/amylase (CREON) capsule 24,000 Units  24,000 Units Oral TID WC Emily B Mullen, MD      . meloxicam (MOBIC) tablet 7.5 mg  7.5 mg Oral Daily Emily B Mullen, MD      . multivitamin with minerals tablet 1 tablet  1 tablet Oral Daily Emily B Mullen, MD      . ondansetron (ZOFRAN) tablet 4 mg  4 mg Oral Q6H PRN Emily B Mullen, MD       Or  . ondansetron (ZOFRAN) injection 4 mg  4 mg Intravenous Q6H PRN Emily B Mullen, MD      . piperacillin-tazobactam (ZOSYN)  IVPB 3.375 g  3.375 g Intravenous Q8H Nathan J Batchelder, RPH      . polyethylene glycol (MIRALAX / GLYCOLAX) packet 17 g  17 g Oral Daily PRN Emily B Mullen, MD      . pregabalin (LYRICA) capsule 75 mg  75 mg Oral TID Emily B Mullen, MD   75 mg at 11/25/16 2306  . ramipril (ALTACE) capsule 5 mg  5 mg Oral Daily Emily B Mullen, MD      . simvastatin (ZOCOR) tablet 40 mg  40 mg Oral QHS Emily B Mullen, MD   40 mg at 11/25/16 2306  . tamsulosin (FLOMAX) capsule 0.4 mg  0.4 mg Oral Daily Emily B Mullen, MD      . vancomycin (VANCOCIN) IVPB 1000 mg/200 mL premix  1,000 mg Intravenous Q12H Emily B Mullen, MD        Allergies  Allergen Reactions  . Antihistamines, Chlorpheniramine-Type Other (See Comments)    Per MAR  . Antihistamines, Diphenhydramine-Type Other (See Comments)    Per MAR  . Antihistamines, Loratadine-Type Other (See Comments)    Per MAR  . Benadryl [Diphenhydramine Hcl] Other (See Comments)  . Ethanolamine Other (See Comments)    Per MAR     REVIEW OF SYSTEMS:   Cardiac:  positive for: no symptoms, negative for: Chest pain or chest pressure, Shortness of breath upon exertion and Shortness of breath when lying flat,   Vascular:  positive for: Leg swelling,  negative for: Pain in calf, thigh, or hip brought on by ambulation, Pain in feet at night that wakes you up from your sleep and Blood clot in your veins  Pulmonary:  positive for: no symptoms,  negative for: Oxygen at home, Productive cough and Wheezing  Neurologic:  positive for: chronic numbness in left foot, negative for: Sudden weakness in arms or legs, Sudden numbness in arms or legs, Sudden onset of difficulty speaking or slurred speech, Temporary loss of vision in one eye and Problems with dizziness  Gastrointestinal:  positive for: no symptoms, negative for: Blood in stool and Vomited blood  Genitourinary:  positive for: no symptoms, negative for: Burning when urinating and Blood in  urine  Psychiatric:  positive for: no symptoms,  negative for: Major depression  Hematologic:  positive for: no symptoms,  negative for: negative for: Bleeding problems and Problems with blood   clotting too easily  Dermatologic:  positive for: Rashes or ulcers, negative for: suspicious skin lesions  Constitutional:  positive for: no symptoms, negative for: Fever or chills  Ear/Nose/Throat:  positive for: no symptoms, negative for: Change in hearing, Nose bleeds and Sore throat  Musculoskeletal:  positive for: no symptoms, negative for: Back pain, Joint pain and Muscle pain   For VQI Use Only  PRE-ADM LIVING: Nursing home  AMB STATUS: Ambulatory  CAD Sx: None  PRIOR CHF: None  STRESS TEST: No   Physical Examination  Vitals:   11/25/16 2030 11/25/16 2130 11/25/16 2205 11/26/16 0540  BP: (!) 161/77 (!) 175/74  (!) 171/76  Pulse: 84 88  82  Resp: 18 16    Temp:  97.6 F (36.4 C)  97.5 F (36.4 C)  TempSrc:  Oral  Oral  SpO2: 99% 100%  99%  Weight:   187 lb 3.2 oz (84.9 kg)     Body mass index is 25.39 kg/m.  General: Alert, Somewhat Confused, WD, NAD  Head: [place]/AT, somewhat asx face  Ear/Nose/Throat: Hearing grossly intact, nares without erythema or drainage, oropharynx without Erythema or Exudate, Mallampati score: 3,   Eyes: PERRLA, EOMI,    Neck: Supple, mid-line trachea,    Pulmonary: Sym exp, good B air movt, CTA B  Cardiac: RRR, Nl S1, S2, no Murmurs, No rubs, No S3,S4  Vascular: Vessel Right Left  Radial Palpable Palpable  Brachial Palpable Palpable  Carotid Palpable Palpable  Aorta Not palpable due to pannus N/A  Femoral Palpable Faintly palpable  Popliteal Not palpable Not palpable  PT Not palpable Not palpable  DP Not palpable Not palpable   Gastrointestinal: soft, non-distended, non-tender to palpation, No guarding or rebound, no HSM, no masses, no CVAT B, No palpable prominent aortic pulse,    Musculoskeletal: M/S 5/5  throughout  , Extremities without ischemic changes except left 5th MT lateral ulcer: no frank pus, necrotic tissue evident, L foot edema 1-2+, erythema along dorsum of L foot with some ascending component  Neurologic: Cranial nerves 2-12 intact , Pain and light touch intact in extremities except feet: L foot sensation << R, Motor exam as listed above  Psychiatric: Judgement intact, Mood & affect appropriate for pt's clinical situation  Dermatologic: See M/S exam for extremity exam, No rashes otherwise noted  Lymph : Palpable lymph nodes: None   Non-Invasive Vascular Imaging  ABI (Date: 11/26/2016)  RIGHT    LEFT    PRESSURE WAVEFORM  PRESSURE WAVEFORM  BRACHIAL 173 Tri BRACHIAL 161 Tri  DP 119 Bi DP 134 Mono  PT 112 Mono PT 139 Mono  GREAT TOE 83 NA GREAT TOE 73 NA    RIGHT LEFT  ABI 0.69 0.8   L TBI 0.45  Laboratory: CBC:    Component Value Date/Time   WBC 7.7 11/25/2016 2212   RBC 3.72 (L) 11/25/2016 2212   HGB 10.8 (L) 11/25/2016 2212   HCT 33.4 (L) 11/25/2016 2212   PLT 375 11/25/2016 2212   MCV 89.8 11/25/2016 2212   MCH 29.0 11/25/2016 2212   MCHC 32.3 11/25/2016 2212   RDW 12.5 11/25/2016 2212   LYMPHSABS 1.8 11/25/2016 1410   MONOABS 0.6 11/25/2016 1410   EOSABS 0.1 11/25/2016 1410   BASOSABS 0.0 11/25/2016 1410    BMP:    Component Value Date/Time   NA 136 11/25/2016 1410   K 4.0 11/25/2016 1410   CL 101 11/25/2016 1410   CO2 28 11/25/2016 1410   GLUCOSE 237 (  H) 11/25/2016 1410   BUN 11 11/25/2016 1410   CREATININE 0.74 11/25/2016 2212   CALCIUM 8.5 (L) 11/25/2016 1410   GFRNONAA >60 11/25/2016 2212   GFRAA >60 11/25/2016 2212    Coagulation: No results found for: INR, PROTIME No results found for: PTT  Lipids: No results found for: CHOL, TRIG, HDL, CHOLHDL, VLDL, LDLCALC, LDLDIRECT  Radiology: Dg Foot Complete Left  Result Date: 11/25/2016 CLINICAL DATA:  Ulcer on lateral side of LEFT foot near little toe excreting pus question  osteomyelitis EXAM: LEFT FOOT - COMPLETE 3+ VIEW COMPARISON:  11/22/2016 FINDINGS: Osseous demineralization. Bone destruction at the head of the fifth metatarsal consistent with osteomyelitis. Additional bone destruction at the lateral base of the proximal phalanx of the fifth toe compatible with osteomyelitis and involvement of the fifth MTP joint compatible with septic arthritis. Overlying soft tissue swelling. No additional fracture, dislocation or bone destruction. Bone destruction at the fifth metatarsal head is progressive since the previous exam, as is the destruction at the base of the proximal phalanx little toe. Tiny plantar calcaneal spur. IMPRESSION: Septic arthritis of the fifth MTP joint with osteomyelitis of the base of the proximal phalanx little toe and more prominently of the distal LEFT fifth metatarsal. Bone destruction is mildly progressive since 11/22/2016. Electronically Signed   By: Mark  Boles M.D.   On: 11/25/2016 18:00   I reviewed the L foot Xrays and there appears to be evidence of evidence of cortical loss of the 5th metatarsal head c/w osteomyelitis   Medical Decision Making  Ralph [name] is a 65 y.o. male who presents with: sub-optimally controlled diabetes, diabetic neuropathy, BLE moderate PAD, LLE critical limb ischemia in form ulcer leading to osteomyelitis 5th MT   Orthopedics has already been consulted by primary team, so I will defer to them the L 5th ray amputation that will likely be needed given the entire L foot appears have develop some infectious involvement.  I would not delay the amputation for revascularization in this case as the foot is already involved  Wide-spectrum IV abx   Further optimization of DM mgmt will be essential to getting a better outcome from any revascularization: Hg A1c to get an idea of baseline  L TBI was only 0.45 which suggests some further revascularization of the L foot will be needed to help heal the left foot.  I  discussed with the patient the natural history of critical limb ischemia: 25% require amputation in one year, 50% are able to maintain their limbs in one year, and 25-30% die in one year due to comorbidities.  Given the limb threatening status of this patient, I recommend an aggressive work up including proceeding with an: Aortogram, Bilateral runoff and possible intervention L leg I discussed with the patient the nature of angiographic procedures, especially the limited patencies of any endovascular intervention. The patient is aware of that the risks of an angiographic procedure include but are not limited to: bleeding, infection, access site complications, embolization, rupture of treated vessel, dissection, possible need for emergent surgical intervention, and possible need for surgical procedures to treat the patient's pathology. The patient is aware of the risks and agrees to proceed.  The procedure is scheduled for: Monday 2 APR 18 with Dr. Dickson.  I discussed in depth with the patient the nature of atherosclerosis, and emphasized the importance of maximal medical management including strict control of blood pressure, blood glucose, and lipid levels, antiplatelet agents, obtaining regular exercise, and cessation of smoking.    The patient is aware that without maximal medical management the underlying atherosclerotic disease process will progress, limiting the benefit of any interventions. The patient is currently on a statin: Zocor.  The patient is currently not on an anti-platelet. Patient will be started on ASA 81 mg PO daily  Thank you for allowing us to participate in this patient's care.   Ralph Mccarrell, MD, FACS [facility] Office: [phone] Pager: [phone]  11/26/2016, 9:50 AM    

## 2016-11-26 NOTE — H&P (View-Only) (Signed)
Reason for Consult:left foot osteo Referring Physician: Mouhamad Teed is an 66 y.o. male.  HPI: 66 yo diabetic male who presents with a several week history of increasing pain and swelling over the lateral left foot.  Patient noted to have increased drainage this week and presented last night to the [facility] ED for eval.  XRAYs in the ED showed osteo and evidence of septic arthritis. Ortho consulted   Past Medical History:  Diagnosis Date  . Anxiety   . BPH (benign prostatic hyperplasia)   . Depression   . Diabetes mellitus without complication ([place])   . Hypertension   . Thyroid disease    hypothyroid    Past Surgical History:  Procedure Laterality Date  . CATARACT EXTRACTION    . EYE SURGERY    . thyroid ablation      Family History  Problem Relation Age of Onset  . Diabetes Mellitus II Mother   . Heart disease Father   . Lung cancer Father   . Diabetes Mellitus II Paternal Grandfather     Social History:  reports that he has quit smoking. He has never used smokeless tobacco. He reports that he does not drink alcohol or use drugs.  Allergies:  Allergies  Allergen Reactions  . Antihistamines, Chlorpheniramine-Type Other (See Comments)    Per MAR  . Antihistamines, Diphenhydramine-Type Other (See Comments)    Per MAR  . Antihistamines, Loratadine-Type Other (See Comments)    Per MAR  . Benadryl [Diphenhydramine Hcl] Other (See Comments)  . Ethanolamine Other (See Comments)    Per MAR    Medications: I have reviewed the patient's current medications.  Results for orders placed or performed during the hospital encounter of 11/25/16 (from the past 48 hour(s))  Comprehensive metabolic panel     Status: Abnormal   Collection Time: 11/25/16  2:10 PM  Result Value Ref Range   Sodium 136 135 - 145 mmol/L   Potassium 4.0 3.5 - 5.1 mmol/L   Chloride 101 101 - 111 mmol/L   CO2 28 22 - 32 mmol/L   Glucose, Bld 237 (H) 65 - 99 mg/dL   BUN 11 6 - 20 mg/dL   Creatinine,  Ser 0.91 0.61 - 1.24 mg/dL   Calcium 8.5 (L) 8.9 - 10.3 mg/dL   Total Protein 6.5 6.5 - 8.1 g/dL   Albumin 2.8 (L) 3.5 - 5.0 g/dL   AST 15 15 - 41 U/L   ALT 15 (L) 17 - 63 U/L   Alkaline Phosphatase 59 38 - 126 U/L   Total Bilirubin 0.6 0.3 - 1.2 mg/dL   GFR calc non Af Amer >60 >60 mL/min   GFR calc Af Amer >60 >60 mL/min    Comment: (NOTE) The eGFR has been calculated using the CKD EPI equation. This calculation has not been validated in all clinical situations. eGFR's persistently <60 mL/min signify possible Chronic Kidney Disease.    Anion gap 7 5 - 15  CBC with Differential     Status: Abnormal   Collection Time: 11/25/16  2:10 PM  Result Value Ref Range   WBC 7.2 4.0 - 10.5 K/uL   RBC 3.75 (L) 4.22 - 5.81 MIL/uL   Hemoglobin 10.9 (L) 13.0 - 17.0 g/dL   HCT 33.7 (L) 39.0 - 52.0 %   MCV 89.9 78.0 - 100.0 fL   MCH 29.1 26.0 - 34.0 pg   MCHC 32.3 30.0 - 36.0 g/dL   RDW 12.5 11.5 - 15.5 %  Platelets 376 150 - 400 K/uL   Neutrophils Relative % 63 %   Neutro Abs 4.6 1.7 - 7.7 K/uL   Lymphocytes Relative 26 %   Lymphs Abs 1.8 0.7 - 4.0 K/uL   Monocytes Relative 9 %   Monocytes Absolute 0.6 0.1 - 1.0 K/uL   Eosinophils Relative 2 %   Eosinophils Absolute 0.1 0.0 - 0.7 K/uL   Basophils Relative 0 %   Basophils Absolute 0.0 0.0 - 0.1 K/uL  I-Stat CG4 Lactic Acid, ED     Status: Abnormal   Collection Time: 11/25/16  2:25 PM  Result Value Ref Range   Lactic Acid, Venous 2.01 (HH) 0.5 - 1.9 mmol/L   Comment NOTIFIED PHYSICIAN   Sedimentation rate     Status: Abnormal   Collection Time: 11/25/16  5:35 PM  Result Value Ref Range   Sed Rate 85 (H) 0 - 16 mm/hr  C-reactive protein     Status: Abnormal   Collection Time: 11/25/16  5:35 PM  Result Value Ref Range   CRP 6.2 (H) <1.0 mg/dL  I-Stat CG4 Lactic Acid, ED     Status: None   Collection Time: 11/25/16  5:52 PM  Result Value Ref Range   Lactic Acid, Venous 1.81 0.5 - 1.9 mmol/L  Prealbumin     Status: Abnormal    Collection Time: 11/25/16 10:12 PM  Result Value Ref Range   Prealbumin 16.5 (L) 18 - 38 mg/dL  CBC     Status: Abnormal   Collection Time: 11/25/16 10:12 PM  Result Value Ref Range   WBC 7.7 4.0 - 10.5 K/uL   RBC 3.72 (L) 4.22 - 5.81 MIL/uL   Hemoglobin 10.8 (L) 13.0 - 17.0 g/dL   HCT 33.4 (L) 39.0 - 52.0 %   MCV 89.8 78.0 - 100.0 fL   MCH 29.0 26.0 - 34.0 pg   MCHC 32.3 30.0 - 36.0 g/dL   RDW 12.5 11.5 - 15.5 %   Platelets 375 150 - 400 K/uL  Creatinine, serum     Status: None   Collection Time: 11/25/16 10:12 PM  Result Value Ref Range   Creatinine, Ser 0.74 0.61 - 1.24 mg/dL   GFR calc non Af Amer >60 >60 mL/min   GFR calc Af Amer >60 >60 mL/min    Comment: (NOTE) The eGFR has been calculated using the CKD EPI equation. This calculation has not been validated in all clinical situations. eGFR's persistently <60 mL/min signify possible Chronic Kidney Disease.   TSH     Status: None   Collection Time: 11/25/16 10:12 PM  Result Value Ref Range   TSH 2.565 0.350 - 4.500 uIU/mL    Comment: Performed by a 3rd Generation assay with a functional sensitivity of <=0.01 uIU/mL.  Glucose, capillary     Status: None   Collection Time: 11/25/16 10:44 PM  Result Value Ref Range   Glucose-Capillary 81 65 - 99 mg/dL  Aerobic Culture (superficial specimen)     Status: None (Preliminary result)   Collection Time: 11/26/16  3:14 AM  Result Value Ref Range   Specimen Description WOUND    Special Requests LEFT FOOT    Gram Stain      NO WBC SEEN RARE GRAM POSITIVE COCCI IN PAIRS RARE SQUAMOUS EPITHELIAL CELLS PRESENT    Culture PENDING    Report Status PENDING   Glucose, capillary     Status: None   Collection Time: 11/26/16  7:37 AM  Result Value Ref Range     Glucose-Capillary 87 65 - 99 mg/dL  Glucose, capillary     Status: Abnormal   Collection Time: 11/26/16  9:58 AM  Result Value Ref Range   Glucose-Capillary 192 (H) 65 - 99 mg/dL    Dg Foot Complete Left  Result Date:  11/25/2016 CLINICAL DATA:  Ulcer on lateral side of LEFT foot near little toe excreting pus question osteomyelitis EXAM: LEFT FOOT - COMPLETE 3+ VIEW COMPARISON:  11/22/2016 FINDINGS: Osseous demineralization. Bone destruction at the head of the fifth metatarsal consistent with osteomyelitis. Additional bone destruction at the lateral base of the proximal phalanx of the fifth toe compatible with osteomyelitis and involvement of the fifth MTP joint compatible with septic arthritis. Overlying soft tissue swelling. No additional fracture, dislocation or bone destruction. Bone destruction at the fifth metatarsal head is progressive since the previous exam, as is the destruction at the base of the proximal phalanx little toe. Tiny plantar calcaneal spur. IMPRESSION: Septic arthritis of the fifth MTP joint with osteomyelitis of the base of the proximal phalanx little toe and more prominently of the distal LEFT fifth metatarsal. Bone destruction is mildly progressive since 11/22/2016. Electronically Signed   By: Lavonia Dana M.D.   On: 11/25/2016 18:00    ROS Blood pressure (!) 171/76, pulse 82, temperature 97.5 F (36.4 C), temperature source Oral, resp. rate 16, weight 84.9 kg (187 lb 3.2 oz), SpO2 99 %. Physical Exam Healthy appearing male in NAD Left foot with 1 cm draining ulcer over the 5th MTP joint with surrounding erythema and edema below the knee Neg Homan's  Assessment/Plan: Left foot osteo and 5th MTP septic arthritis,  Discussed with patient and recommended 5th toe amp and ray resection to eliminate deep infected tissue.  He agrees with this plan.  Amani Marseille,STEVEN R 11/26/2016, 11:21 AM

## 2016-11-26 NOTE — Transfer of Care (Signed)
Immediate Anesthesia Transfer of Care Note  Patient: Ralph [name]  Procedure(s) Performed: Procedure(s): RAY AMPUTATION LEFT FIFTH TOE (Left)  Patient Location: PACU  Anesthesia Type:GA combined with regional for post-op pain  Level of Consciousness: awake, alert , oriented and patient cooperative  Airway & Oxygen Therapy: Patient Spontanous Breathing and Patient connected to nasal cannula oxygen  Post-op Assessment: Report given to RN, Post -op Vital signs reviewed and stable and Patient moving all extremities X 4  Post vital signs: Reviewed and stable  Last Vitals:  Vitals:   11/26/16 1840 11/26/16 1947  BP: (!) 194/70 (!) (P) 156/76  Pulse: 91 (P) 91  Resp: 13 (P) 15  Temp:  (P) 36.4 C    Last Pain:  Vitals:   11/26/16 1619  TempSrc: Oral  PainSc:       Patients Stated Pain Goal: 1 (11/26/16 0800)  Complications: No apparent anesthesia complications

## 2016-11-26 NOTE — Progress Notes (Signed)
VASCULAR LAB PRELIMINARY  ARTERIAL  ABI completed: Moderate PAD bilaterally.    RIGHT    LEFT    PRESSURE WAVEFORM  PRESSURE WAVEFORM  BRACHIAL 173 Tri BRACHIAL 161 Tri  DP 119 Bi DP 134 Mono  PT 112 Mono PT 139 Mono  GREAT TOE 83 NA GREAT TOE 73 NA    RIGHT LEFT  ABI 0.69 0.8     Chauncey Fischer, RVT 11/26/2016, 8:57 AM

## 2016-11-26 NOTE — Discharge Instructions (Signed)
Keep left foot dressing in place.  May walk on heel only with post op shoe.  Elevate left foot when possible  Follow up with Dr Ranell Patrick in one week in the clinic for a wound check  [phone]

## 2016-11-26 NOTE — Brief Op Note (Signed)
11/25/2016 - 11/26/2016  8:19 PM  PATIENT:  Ralph [name]  66 y.o. male  PRE-OPERATIVE DIAGNOSIS:  osteomylitis, 5th toe left foot  POST-OPERATIVE DIAGNOSIS:  osteomylitis, 5th toe left foot  PROCEDURE:  Procedure(s): RAY AMPUTATION LEFT FIFTH TOE (Left)  SURGEON:  Surgeon(s) and Role:    * Beverely Low, MD - Primary  PHYSICIAN ASSISTANT:   ASSISTANTS: none   ANESTHESIA:   general  EBL:  Total I/O In: 700 [I.V.:700] Out: 5 [Blood:5]  BLOOD ADMINISTERED:none  DRAINS: none   LOCAL MEDICATIONS USED:  NONE  SPECIMEN:  Source of Specimen:  infected foot  DISPOSITION OF SPECIMEN:  micro  COUNTS:  YES  TOURNIQUET:   Total Tourniquet Time Documented: Calf (Left) - 12 minutes Total: Calf (Left) - 12 minutes   DICTATION: .Other Dictation: Dictation Number [id number]  PLAN OF CARE: Admit to inpatient   PATIENT DISPOSITION:  PACU - hemodynamically stable.   Delay start of Pharmacological VTE agent (>24hrs) due to surgical blood loss or risk of bleeding: not applicable

## 2016-11-26 NOTE — Progress Notes (Signed)
I spoke to Ralph [name] from [facility] DSS and she confirmed that they were the patient's guardian who had also give verbal consent to floor RN. She states they will fax over form on Monday. I contact patient's place of residence [address] of [place] where patient resides. They faxed over forms but not forms that confirm who the Guardian is. The patient who is alert and oriented x 4 reports that Ralph [name] DSS is his guardian and that his sister who is listed on the HCPOA form gave up making decisions for him. Dr. Ranell Patrick is aware and prior to this RN assisting with nerve block he confirmed that he wanted to proceed with surgery due to the risk of not going forward with surgery.

## 2016-11-26 NOTE — Progress Notes (Signed)
Hypoglycemic Event  CBG:66  Treatment: D50 IV 25 mL  Symptoms: Shaky  Follow-up CBG: Time: 1646 CBG Result:108  Possible Reasons for Event: Other: NPO for surgery. D5 1/2 on hold while antibiotics running.  Comments/MD notified: RN notified in preop. Will follow up CBG in OR holding. Follow up CBG 108 per chart.    [name], Ralph Reddoch

## 2016-11-26 NOTE — Anesthesia Postprocedure Evaluation (Addendum)
Anesthesia Post Note  Patient: Ralph [name]  Procedure(s) Performed: Procedure(s) (LRB): RAY AMPUTATION LEFT FIFTH TOE (Left)  Patient location during evaluation: PACU Anesthesia Type: General Level of consciousness: awake, awake and alert and oriented Pain management: pain level controlled Vital Signs Assessment: post-procedure vital signs reviewed and stable Respiratory status: spontaneous breathing, nonlabored ventilation and respiratory function stable Cardiovascular status: blood pressure returned to baseline Anesthetic complications: no       Last Vitals:  Vitals:   11/26/16 2012 11/26/16 2025  BP: (!) 164/79 (!) 173/67  Pulse: 85 81  Resp: 14 13  Temp:  36.4 C    Last Pain:  Vitals:   11/26/16 1619  TempSrc: Oral  PainSc:                  Lovada Barwick COKER

## 2016-11-26 NOTE — Anesthesia Procedure Notes (Signed)
Procedure Name: LMA Insertion Date/Time: 11/26/2016 6:56 PM Performed by: Melina Schools Pre-anesthesia Checklist: Patient identified, Emergency Drugs available, Suction available, Patient being monitored and Timeout performed Patient Re-evaluated:Patient Re-evaluated prior to inductionOxygen Delivery Method: Circle system utilized Preoxygenation: Pre-oxygenation with 100% oxygen Intubation Type: IV induction Ventilation: Mask ventilation without difficulty LMA: LMA inserted LMA Size: 4.0 Number of attempts: 1 Placement Confirmation: positive ETCO2 and breath sounds checked- equal and bilateral Tube secured with: Tape Dental Injury: Teeth and Oropharynx as per pre-operative assessment

## 2016-11-26 NOTE — Care Management Note (Signed)
66 yo M diabetic with L foot osteo and 5th MTP septic arthritis. Pt for surgery (5th toe amp and ray resection) next week.    Received referral to assist with [facility] needs and DME.  Met with pt at bedside. He resides at [facility] facility. D/C plan is SNF. SW to f/u to assist with SNF placement.

## 2016-11-26 NOTE — Interval H&P Note (Signed)
History and Physical Interval Note:  11/26/2016 6:42 PM  Ralph [name]  has presented today for surgery, with the diagnosis of osteomylitis  The various methods of treatment have been discussed with the patient and family. After consideration of risks, benefits and other options for treatment, the patient has consented to  Procedure(s) with comments: AMPUTATION RAY FIFTH TOE (Left) - Fifth toe left foot as a surgical intervention .  The patient's history has been reviewed, patient examined, no change in status, stable for surgery.  I have reviewed the patient's chart and labs.  Questions were answered to the patient's satisfaction.    Patient is a ward of the state and Nursing has spoken with the responsible party who understands the medical necessity of the operation and agrees with the plan.  I have spoken with the patient who demonstrated to me that he understands his diagnosis and the need for surgery to prevent a deterioration of his condition.  Linnet Bottari,STEVEN R

## 2016-11-26 NOTE — Progress Notes (Signed)
Orthopedic Tech Progress Note Patient Details:  Ralph [name] April 09, 1951 [id number]  Ortho Devices Type of Ortho Device: Postop shoe/boot Ortho Device/Splint Location: LLE Ortho Device/Splint Interventions: Ordered, Application   Jennye Moccasin 11/26/2016, 10:33 PM

## 2016-11-26 NOTE — Progress Notes (Signed)
PROGRESS NOTE  [name]  [name]:[id number] DOB: September 03, 1950 DOA: 11/25/2016 PCP: Michiel Sites, MD   Brief Narrative: Ralph [name] is a 66 y.o. male with a history of T2DM, BPH, HTN, hypothyroidism (hyperthyroidism s/p ablation), depression and anxiety who presented 3/30 for left foot wound, swelling, erythema and pain. He developed a blister on the lateral aspect of the left foot near the toe about 2 weeks ago that has worsened after buying new shoes that were too narrow. He is insensate at the toes and distal portion of the foot and had neuropathic pain in the other parts of the foot. A custodian at his SNF noticed increased swelling and erythema and advised that he get seen. Of note, he was seen on 09/21/16 by his podiatrist and there was no wound noted at that time. On arrival, hydrocodone improved the pain. Lactic acid noted to be 2.01 which improved with fluids.  He was given a dose of Abx. A foot xray showed likely septic arthritis and osteomyelitis. Orthopedics has been consulted. Preliminary ABI data demonstrated ABIs of 0.8 on the left (affected side) and 0.69 on the right. Vascular surgery has been consulted.   Assessment & Plan: Active Problems:   Open wound of left foot   Thyroid disease   Hypertension   Diabetes mellitus without complication ([facility])   Depression   BPH (benign prostatic hyperplasia)   Anxiety  Diabetic foot wound with radiographic evidence of osteomyelitis, concern for septic arthritis: - Orthopedics consulted - Monitor blood cultures. Superficial wound cultures also obtained, unsure of utility of this.  - Continue abx: Consolidate ceftriaxone and flagyl to meropenem to additionally cover pseudomonas, and continue vancomycin for coverage of possible MRSA (SNF resident). - HIV antibody pending - Prealbumin slightly low at 16.5: Nutrition consult - Vicodin for pain  PAD: Preliminary ABI data demonstrated ABIs of 0.8 on the left (affected side) and 0.69 on  the right.  - Vascular surgery has been consulted.  - Check lipid panel in AM, on ASA.  IDT2DM with peripheral neuropathy: Reports good control.  - Hold metformin - Check A1c - Given home dose of levemir, 26 units this AM and SSI, and is now NPO with AM CBGs in 80's. Will check CBG q2h for now until known to be stable.   - Continue pregabalin    Hypothyroidisim - Check TSH - Continue synthroid    Hypertension - Continue ramipril , consider additional agent if BPs stay high (?from pain) - Start PRN hydralazine    Depression and anxiety - Continue home regimen: bupropion, clonazepam, depakote    BPH (benign prostatic hyperplasia) - Continue tamsulosin   Constipation - Colace scheduled - PRN miralax.   DVT prophylaxis: Heparin Code Status: Full Family Communication: None at bedside Disposition Plan: Uncertain timing, will likely DC back to SNF  Consultants:   Orthopedics, Dr. Ranell Patrick  Vascular Surgery  Procedures:   None  Antimicrobials:  Ceftriaxone 3/30  Flagyl 3/30  Zosyn 3/31 >>   Vancomycin 3/30 >>    Subjective: Pt with controlled pain. Endorses history as above. No chest pain, dyspnea.   Objective: Vitals:   11/25/16 2030 11/25/16 2130 11/25/16 2205 11/26/16 0540  BP: (!) 161/77 (!) 175/74  (!) 171/76  Pulse: 84 88  82  Resp: 18 16    Temp:  97.6 F (36.4 C)  97.5 F (36.4 C)  TempSrc:  Oral  Oral  SpO2: 99% 100%  99%  Weight:   84.9 kg (187 lb 3.2  oz)     Intake/Output Summary (Last 24 hours) at 11/26/16 0919 Last data filed at 11/25/16 1842  Gross per 24 hour  Intake             1050 ml  Output                0 ml  Net             1050 ml   Filed Weights   11/25/16 2205  Weight: 84.9 kg (187 lb 3.2 oz)    Examination: General exam: 66 y.o. male in no distress Respiratory system: Non-labored breathing room air. Clear to auscultation bilaterally.  Cardiovascular system: Regular rate and rhythm. No murmur, rub, or gallop. No  JVD. Gastrointestinal system: Abdomen soft, non-tender, non-distended, with normoactive bowel sounds. No organomegaly or masses felt. Central nervous system: Alert and oriented. No focal neurological deficits. Extremities: LLE edematous to calf with ill-defined irregular erythema extending circumferentially over dorsum of left foot from ~1.5cm diameter annular shallow ulcer without drainage, fluctuance, or visible bone. Insensate in this area.  Skin: As above Psychiatry: Judgement and insight appear normal. Mood & affect appropriate.   Data Reviewed: I have personally reviewed following labs and imaging studies  CBC:  Recent Labs Lab 11/25/16 1410 11/25/16 2212  WBC 7.2 7.7  NEUTROABS 4.6  --   HGB 10.9* 10.8*  HCT 33.7* 33.4*  MCV 89.9 89.8  PLT 376 375   Basic Metabolic Panel:  Recent Labs Lab 11/25/16 1410 11/25/16 2212  NA 136  --   K 4.0  --   CL 101  --   CO2 28  --   GLUCOSE 237*  --   BUN 11  --   CREATININE 0.91 0.74  CALCIUM 8.5*  --    GFR: Estimated Creatinine Clearance: 101 mL/min (by C-G formula based on SCr of 0.74 mg/dL). Liver Function Tests:  Recent Labs Lab 11/25/16 1410  AST 15  ALT 15*  ALKPHOS 59  BILITOT 0.6  PROT 6.5  ALBUMIN 2.8*   No results for input(s): LIPASE, AMYLASE in the last 168 hours. No results for input(s): AMMONIA in the last 168 hours. Coagulation Profile: No results for input(s): INR, PROTIME in the last 168 hours. Cardiac Enzymes: No results for input(s): CKTOTAL, CKMB, CKMBINDEX, TROPONINI in the last 168 hours. BNP (last 3 results) No results for input(s): PROBNP in the last 8760 hours. HbA1C: No results for input(s): HGBA1C in the last 72 hours. CBG:  Recent Labs Lab 11/25/16 2244 11/26/16 0737  GLUCAP 81 87   Lipid Profile: No results for input(s): CHOL, HDL, LDLCALC, TRIG, CHOLHDL, LDLDIRECT in the last 72 hours. Thyroid Function Tests:  Recent Labs  11/25/16 2212  TSH 2.565   Anemia Panel: No  results for input(s): VITAMINB12, FOLATE, FERRITIN, TIBC, IRON, RETICCTPCT in the last 72 hours. Urine analysis:    Component Value Date/Time   COLORURINE YELLOW 02/01/2009 1754   APPEARANCEUR CLEAR 02/01/2009 1754   LABSPEC 1.040 (H) 02/01/2009 1754   PHURINE 5.5 02/01/2009 1754   GLUCOSEU >1000 (A) 02/01/2009 1754   HGBUR NEGATIVE 02/01/2009 1754   BILIRUBINUR NEGATIVE 02/01/2009 1754   KETONESUR TRACE (A) 02/01/2009 1754   PROTEINUR 30 (A) 02/01/2009 1754   UROBILINOGEN 1.0 02/01/2009 1754   NITRITE NEGATIVE 02/01/2009 1754   LEUKOCYTESUR NEGATIVE 02/01/2009 1754   Recent Results (from the past 240 hour(s))  Aerobic Culture (superficial specimen)     Status: None (Preliminary result)   Collection  Time: 11/26/16  3:14 AM  Result Value Ref Range Status   Specimen Description WOUND  Final   Special Requests LEFT FOOT  Final   Gram Stain   Final    NO WBC SEEN RARE GRAM POSITIVE COCCI IN PAIRS RARE SQUAMOUS EPITHELIAL CELLS PRESENT    Culture PENDING  Incomplete   Report Status PENDING  Incomplete      Radiology Studies: Dg Foot Complete Left  Result Date: 11/25/2016 CLINICAL DATA:  Ulcer on lateral side of LEFT foot near little toe excreting pus question osteomyelitis EXAM: LEFT FOOT - COMPLETE 3+ VIEW COMPARISON:  11/22/2016 FINDINGS: Osseous demineralization. Bone destruction at the head of the fifth metatarsal consistent with osteomyelitis. Additional bone destruction at the lateral base of the proximal phalanx of the fifth toe compatible with osteomyelitis and involvement of the fifth MTP joint compatible with septic arthritis. Overlying soft tissue swelling. No additional fracture, dislocation or bone destruction. Bone destruction at the fifth metatarsal head is progressive since the previous exam, as is the destruction at the base of the proximal phalanx little toe. Tiny plantar calcaneal spur. IMPRESSION: Septic arthritis of the fifth MTP joint with osteomyelitis of the  base of the proximal phalanx little toe and more prominently of the distal LEFT fifth metatarsal. Bone destruction is mildly progressive since 11/22/2016. Electronically Signed   By: Ulyses Southward M.D.   On: 11/25/2016 18:00    Scheduled Meds: . artificial tears   Both Eyes QHS  . buPROPion  300 mg Oral Daily  . cefTRIAXone (ROCEPHIN)  IV  2 g Intravenous QHS   And  . metroNIDAZOLE  500 mg Oral Q8H  . clonazePAM  0.5 mg Oral BID  . divalproex  1,000 mg Oral Daily  . docusate sodium  100 mg Oral BID  . heparin  5,000 Units Subcutaneous Q8H  . insulin aspart  0-15 Units Subcutaneous TID WC  . insulin aspart  0-5 Units Subcutaneous QHS  . insulin detemir  26 Units Subcutaneous BH-[id number]  . levothyroxine  200 mcg Oral QAC breakfast  . lipase/protease/amylase  24,000 Units Oral TID WC  . meloxicam  7.5 mg Oral Daily  . metFORMIN  1,000 mg Oral BID  . multivitamin with minerals  1 tablet Oral Daily  . pregabalin  75 mg Oral TID  . ramipril  5 mg Oral Daily  . simvastatin  40 mg Oral QHS  . tamsulosin  0.4 mg Oral Daily  . vancomycin  1,000 mg Intravenous Q12H   Continuous Infusions:   LOS: 1 day   Time spent: 25 minutes.  Hazeline Junker, MD [facility] Pager [phone]  If 7PM-7AM, please contact night-coverage [email or web address] Password [email or web address] 11/26/2016, 9:19 AM

## 2016-11-26 NOTE — Anesthesia Procedure Notes (Signed)
Anesthesia Regional Block: Adductor canal block   Pre-Anesthetic Checklist: ,, timeout performed, Correct Patient, Correct Site, Correct Laterality, Correct Procedure, Correct Position, site marked, Risks and benefits discussed,  Surgical consent,  Pre-op evaluation,  At surgeon's request and post-op pain management  Laterality: Left  Prep: chloraprep       Needles:  Injection technique: Single-shot  Needle Type: Stimulator Needle - 80          Additional Needles:   Procedures: ultrasound guided,,,,,,,,  Narrative:  Start time: 11/26/2016 6:30 PM End time: 11/26/2016 6:35 PM Injection made incrementally with aspirations every 5 mL.  Performed by: Personally   Additional Notes: 25 cc 0.5% Ropivacaine injected easily

## 2016-11-27 ENCOUNTER — Encounter (HOSPITAL_COMMUNITY): Payer: Self-pay | Admitting: Orthopedic Surgery

## 2016-11-27 DIAGNOSIS — E119 Type 2 diabetes mellitus without complications: Secondary | ICD-10-CM

## 2016-11-27 DIAGNOSIS — M009 Pyogenic arthritis, unspecified: Secondary | ICD-10-CM

## 2016-11-27 DIAGNOSIS — E440 Moderate protein-calorie malnutrition: Secondary | ICD-10-CM

## 2016-11-27 LAB — HEMOGLOBIN A1C
HEMOGLOBIN A1C: 7.8 % — AB (ref 4.8–5.6)
MEAN PLASMA GLUCOSE: 177 mg/dL

## 2016-11-27 LAB — GLUCOSE, CAPILLARY
GLUCOSE-CAPILLARY: 88 mg/dL (ref 65–99)
GLUCOSE-CAPILLARY: 197 mg/dL — AB (ref 65–99)
GLUCOSE-CAPILLARY: 258 mg/dL — AB (ref 65–99)
Glucose-Capillary: 160 mg/dL — ABNORMAL HIGH (ref 65–99)
Glucose-Capillary: 105 mg/dL — ABNORMAL HIGH (ref 65–99)
Glucose-Capillary: 237 mg/dL — ABNORMAL HIGH (ref 65–99)

## 2016-11-27 LAB — LIPID PANEL
CHOL/HDL RATIO: 4.2 ratio
CHOLESTEROL: 121 mg/dL (ref 0–200)
HDL: 29 mg/dL — AB (ref >40)
LDL Cholesterol: 74 mg/dL (ref 0–99)
TRIGLYCERIDES: 89 mg/dL (ref <150)
VLDL: 18 mg/dL (ref 0–40)

## 2016-11-27 NOTE — Evaluation (Signed)
Physical Therapy Evaluation Patient Details Name: Ralph [name] MRN: [id number] DOB: [birth year] Today's Date: 11/27/2016   History of Present Illness  Pt is a 66 yo male admitted through ED on 11/25/16 with a open wound on LLE. Pt underwent a 5th ray ampuation on 11/26/16. PMH significant for thyroid disorder, HTN, DM2, Depression, BPH, Anxiety, septic OA.   Clinical Impression  Pt is POD 1 and is moving well with therapy. Prior to admission, pt lived in an ALF where he was mostly independent but required assistance for medication management and supervision due to cognitive deficits. Pt is able to perform mobility at a Min A to [employer] assistance and requires cues for safety awareness and proper use of RW. Pt will benefit from continued acute rehab services in order to assist with a smooth transition to venue recommended below.     Follow Up Recommendations Home health PT;Supervision for mobility/OOB    Equipment Recommendations  Rolling walker with 5" wheels    Recommendations for Other Services       Precautions / Restrictions Precautions Precautions: Fall Required Braces or Orthoses: Other Brace/Splint Other Brace/Splint: post op shoe Restrictions Weight Bearing Restrictions: Yes LLE Weight Bearing: Weight bearing as tolerated      Mobility  Bed Mobility Overal bed mobility: Modified Independent             General bed mobility comments: able to get EOB using rails and HOB elevated  Transfers Overall transfer level: Needs assistance Equipment used: Rolling walker (2 wheeled) Transfers: Sit to/from Stand Sit to Stand: Min guard         General transfer comment: Min gaurd for safety from EOB with cues for hand placement  Ambulation/Gait Ambulation/Gait assistance: Min assist Ambulation Distance (Feet): 30 Feet Assistive device: Rolling walker (2 wheeled) Gait Pattern/deviations: Step-to pattern;Decreased step length - right;Decreased stance time -  left;Antalgic Gait velocity: decreased Gait velocity interpretation: Below normal speed for age/gender General Gait Details: Mild antalgic gait with increased knee and hip flexion required to clear left toe as nerve block has not completely worn off.   Stairs            Wheelchair Mobility    Modified Rankin (Stroke Patients Only)       Balance Overall balance assessment: Needs assistance Sitting-balance support: No upper extremity supported;Feet supported Sitting balance-Leahy Scale: Good     Standing balance support: Bilateral upper extremity supported;No upper extremity supported;During functional activity Standing balance-Leahy Scale: Fair Standing balance comment: able to stand at sink and wash hands without holding but requires CGA.                              Pertinent Vitals/Pain Pain Assessment: No/denies pain (nerve block has not completely worn off)    Home Living Family/patient expects to be discharged to:: Assisted living               Home Equipment: None      Prior Function Level of Independence: Needs assistance   Gait / Transfers Assistance Needed: was getting around without an AD  ADL's / Homemaking Assistance Needed: able to do own adls        Hand Dominance   Dominant Hand: Right    Extremity/Trunk Assessment   Upper Extremity Assessment Upper Extremity Assessment: Overall WFL for tasks assessed    Lower Extremity Assessment Lower Extremity Assessment: LLE deficits/detail LLE Deficits / Details: pt with noticeable  weakness in LLE due to nerve block with at least 2/5 ankle  LLE: Unable to fully assess due to immobilization       Communication   Communication: No difficulties  Cognition Arousal/Alertness: Awake/alert Behavior During Therapy: Anxious;WFL for tasks assessed/performed;Impulsive Overall Cognitive Status: History of cognitive impairments - at baseline                                  General Comments: Requires constant verbal cues for safety awareness and position in RW      General Comments      Exercises     Assessment/Plan    PT Assessment Patient needs continued PT services  PT Problem List Decreased strength;Decreased activity tolerance;Decreased balance;Decreased mobility;Decreased knowledge of use of DME;Pain       PT Treatment Interventions DME instruction;Gait training;Functional mobility training;Therapeutic activities;Therapeutic exercise;Balance training    PT Goals (Current goals can be found in the Care Plan section)  Acute Rehab PT Goals Patient Stated Goal: to get back home and learn to walk again PT Goal Formulation: With patient Time For Goal Achievement: 07/23/17 Potential to Achieve Goals: Good    Frequency Min 5X/week   Barriers to discharge        Co-evaluation               End of Session Equipment Utilized During Treatment: Gait belt;Other (comment) (post op shoe) Activity Tolerance: Patient tolerated treatment well Patient left: in chair;with call bell/phone within reach Nurse Communication: Mobility status PT Visit Diagnosis: Unsteadiness on feet (R26.81);Difficulty in walking, not elsewhere classified (R26.2)    Time: 1610-9604 PT Time Calculation (min) (ACUTE ONLY): 29 min   Charges:   PT Evaluation $PT Eval Moderate Complexity: 1 Procedure PT Treatments $Gait Training: 8-22 mins   PT G Codes:        Colin Broach PT, DPT  [phone]   Ruel Favors Aletha Halim 11/27/2016, 1:14 PM

## 2016-11-27 NOTE — Progress Notes (Signed)
   Subjective:  Patient reports pain as mild to moderate.  No c/o.  Objective:   VITALS:   Vitals:   11/26/16 2012 11/26/16 2025 11/27/16 0030 11/27/16 0519  BP: (!) 164/79 (!) 173/67 (!) 156/67 (!) 155/71  Pulse: 85 81 84 83  Resp: Temp:  97.5 F (36.4 C) 97.5 F (36.4 C) 97.7 F (36.5 C)  TempSrc:  Oral Axillary Oral  SpO2: 98% 99% 97% 97%  Weight:      Height:        NAD, eating breakfast. Incision: dressing C/D/I BCR toes  Lab Results  Component Value Date   WBC 7.7 11/25/2016   HGB 10.8 (L) 11/25/2016   HCT 33.4 (L) 11/25/2016   MCV 89.8 11/25/2016   PLT 375 11/25/2016   BMET    Component Value Date/Time   NA 136 11/25/2016 1410   K 4.0 11/25/2016 1410   CL 101 11/25/2016 1410   CO2 28 11/25/2016 1410   GLUCOSE 237 (H) 11/25/2016 1410   BUN 11 11/25/2016 1410   CREATININE 0.74 11/25/2016 2212   CALCIUM 8.5 (L) 11/25/2016 1410   GFRNONAA >60 11/25/2016 2212   GFRAA >60 11/25/2016 2212     Assessment/Plan: 1 Day Post-Op   Active Problems:   Open wound of left foot   Thyroid disease   Hypertension   Diabetes mellitus without complication (HCC)   Depression   BPH (benign prostatic hyperplasia)   Anxiety   Malnutrition of moderate degree   Septic arthritis of foot (HCC)    Cont IV abx x48 hrs Will need 2 weeks PO abx Vascular procedure tomorrow Following   Garnet Koyanagi 11/27/2016, 9:26 AM   Samson Frederic, MD Cell [phone]

## 2016-11-27 NOTE — Progress Notes (Signed)
PROGRESS NOTE  Ralph [name]  ZOX:[id number] DOB: [birth year] DOA: 11/25/2016 PCP: Michiel Sites, MD   Brief Narrative: Ralph [name] is a 66 y.o. male with a history of T2DM, BPH, HTN, hypothyroidism (hyperthyroidism s/p ablation), depression and anxiety who presented 3/30 for left foot wound, swelling, erythema and pain. He developed a blister on the lateral aspect of the left foot near the toe about 2 weeks ago that has worsened after buying new shoes that were too narrow. He is insensate at the toes and distal portion of the foot and had neuropathic pain in the other parts of the foot. A custodian at his SNF noticed increased swelling and erythema and advised that he get seen. Of note, he was seen on 09/21/16 by his podiatrist and there was no wound noted at that time. On arrival, hydrocodone improved the pain. Lactic acid noted to be 2.01 which improved with fluids.  He was given a dose of Abx. A foot xray showed likely septic arthritis and osteomyelitis. Orthopedics performed left 5th toe ray amputation 3/31 and IV antibiotics have been continued. Preliminary ABI data demonstrated ABIs of 0.8 on the left (affected side) and 0.69 on the right. Vascular surgery is planning aortogram 4/2.   Assessment & Plan: Active Problems:   Open wound of left foot   Thyroid disease   Hypertension   Diabetes mellitus without complication (HCC)   Depression   BPH (benign prostatic hyperplasia)   Anxiety   Malnutrition of moderate degree   Septic arthritis of foot (HCC)  Diabetic foot wound with radiographic evidence of osteomyelitis, concern for septic arthritis: HIV non-reactive. - Orthopedics consulted: s/p 5th toe ray amputation 3/31.  - Monitor blood cultures. Superficial wound cultures also obtained, unsure of utility of this.  - Continue zosyn and vancomycin for coverage of possible MRSA (SNF resident). - Prealbumin slightly low at 16.5: Nutrition consult - Vicodin for pain  PAD: Preliminary  ABI data demonstrated ABIs of 0.8 on the left (affected side) and 0.69 on the right.  - Vascular surgery planning aortogram and possible intervention 4/2. - LDL 74 not on statin, on ASA.  IDT2DM with peripheral neuropathy: Reports good control. HbA1c 7.8% - Holding metformin - Continue home dose of levemir, 26 units qAM and SSI, at inpatient goal.  - Continue pregabalin    Hypothyroidism: TSH 2.565 - Continue synthroid    Hypertension - Continue ramipril , consider additional agent if BPs stay high (?from pain) - PRN hydralazine    Depression and anxiety - Continue home regimen: bupropion, clonazepam, depakote    BPH (benign prostatic hyperplasia) - Continue tamsulosin   Constipation - Colace scheduled - PRN miralax.   DVT prophylaxis: Heparin Code Status: Full Family Communication: None at bedside; ward of the state Disposition Plan: Uncertain timing, will likely DC back to SNF  Consultants:   Orthopedics, Dr. Ranell Patrick  Vascular Surgery  Procedures:   None  Antimicrobials:  Ceftriaxone 3/30  Flagyl 3/30  Zosyn 3/31 >>   Vancomycin 3/30 >>    Subjective: Pt with controlled pain. Aware of plan and agrees. No complaints.   Objective: Vitals:   11/26/16 2012 11/26/16 2025 11/27/16 0030 11/27/16 0519  BP: (!) 164/79 (!) 173/67 (!) 156/67 (!) 155/71  Pulse: 85 81 84 83  Resp: Temp:  97.5 F (36.4 C) 97.5 F (36.4 C) 97.7 F (36.5 C)  TempSrc:  Oral Axillary Oral  SpO2: 98% 99% 97% 97%  Weight:  Height:        Intake/Output Summary (Last 24 hours) at 11/27/16 1512 Last data filed at 11/27/16 0847  Gross per 24 hour  Intake              750 ml  Output             1775 ml  Net            -1025 ml   Filed Weights   11/25/16 2205 11/26/16 0800  Weight: 84.9 kg (187 lb 3.2 oz) 84.8 kg (187 lb)    Examination: General exam: 66 y.o. male in no distress Respiratory system: Non-labored breathing room air. Clear to auscultation  bilaterally.  Cardiovascular system: Regular rate and rhythm. No murmur, rub, or gallop. No JVD. Gastrointestinal system: Abdomen soft, non-tender, non-distended, with normoactive bowel sounds. No organomegaly or masses felt. Central nervous system: Alert and oriented. No focal neurological deficits. Extremities: LLE edematous to calf with ill-defined irregular erythema extending circumferentially over dorsum of left foot. Dressing c/d/i. Insensate in this area.  Skin: As above Psychiatry: Judgement and insight appear normal. Mood & affect appropriate.   Data Reviewed: I have personally reviewed following labs and imaging studies  CBC:  Recent Labs Lab 11/25/16 1410 11/25/16 2212  WBC 7.2 7.7  NEUTROABS 4.6  --   HGB 10.9* 10.8*  HCT 33.7* 33.4*  MCV 89.9 89.8  PLT 376 375   Basic Metabolic Panel:  Recent Labs Lab 11/25/16 1410 11/25/16 2212  NA 136  --   K 4.0  --   CL 101  --   CO2 28  --   GLUCOSE 237*  --   BUN 11  --   CREATININE 0.91 0.74  CALCIUM 8.5*  --    GFR: Estimated Creatinine Clearance: 101 mL/min (by C-G formula based on SCr of 0.74 mg/dL). Liver Function Tests:  Recent Labs Lab 11/25/16 1410  AST 15  ALT 15*  ALKPHOS 59  BILITOT 0.6  PROT 6.5  ALBUMIN 2.8*   No results for input(s): LIPASE, AMYLASE in the last 168 hours. No results for input(s): AMMONIA in the last 168 hours. Coagulation Profile: No results for input(s): INR, PROTIME in the last 168 hours. Cardiac Enzymes: No results for input(s): CKTOTAL, CKMB, CKMBINDEX, TROPONINI in the last 168 hours. BNP (last 3 results) No results for input(s): PROBNP in the last 8760 hours. HbA1C:  Recent Labs  11/25/16 2212  HGBA1C 7.8*   CBG:  Recent Labs Lab 11/26/16 2258 11/27/16 0219 11/27/16 0500 11/27/16 0837 11/27/16 1159  GLUCAP 134* 160* 105* 88 237*   Lipid Profile:  Recent Labs  11/27/16 0431  CHOL 121  HDL 29*  LDLCALC 74  TRIG 89  CHOLHDL 4.2   Thyroid  Function Tests:  Recent Labs  11/25/16 2212  TSH 2.565   Anemia Panel: No results for input(s): VITAMINB12, FOLATE, FERRITIN, TIBC, IRON, RETICCTPCT in the last 72 hours. Urine analysis:    Component Value Date/Time   COLORURINE YELLOW 02/01/2009 1754   APPEARANCEUR CLEAR 02/01/2009 1754   LABSPEC 1.040 (H) 02/01/2009 1754   PHURINE 5.5 02/01/2009 1754   GLUCOSEU >1000 (A) 02/01/2009 1754   HGBUR NEGATIVE 02/01/2009 1754   BILIRUBINUR NEGATIVE 02/01/2009 1754   KETONESUR TRACE (A) 02/01/2009 1754   PROTEINUR 30 (A) 02/01/2009 1754   UROBILINOGEN 1.0 02/01/2009 1754   NITRITE NEGATIVE 02/01/2009 1754   LEUKOCYTESUR NEGATIVE 02/01/2009 1754   Recent Results (from the past 240 hour(s))  Blood  culture (routine x 2)     Status: None (Preliminary result)   Collection Time: 11/25/16  5:30 PM  Result Value Ref Range Status   Specimen Description BLOOD RIGHT WRIST  Final   Special Requests IN PEDIATRIC BOTTLE 3CC  Final   Culture NO GROWTH 2 DAYS  Final   Report Status PENDING  Incomplete  Blood culture (routine x 2)     Status: None (Preliminary result)   Collection Time: 11/25/16  5:35 PM  Result Value Ref Range Status   Specimen Description BLOOD LEFT ANTECUBITAL  Final   Special Requests BOTTLES DRAWN AEROBIC AND ANAEROBIC 5CC  Final   Culture NO GROWTH 2 DAYS  Final   Report Status PENDING  Incomplete  Aerobic Culture (superficial specimen)     Status: None (Preliminary result)   Collection Time: 11/26/16  3:14 AM  Result Value Ref Range Status   Specimen Description WOUND  Final   Special Requests LEFT FOOT  Final   Gram Stain   Final    NO WBC SEEN RARE GRAM POSITIVE COCCI IN PAIRS RARE SQUAMOUS EPITHELIAL CELLS PRESENT    Culture   Final    FEW STAPHYLOCOCCUS AUREUS SUSCEPTIBILITIES TO FOLLOW    Report Status PENDING  Incomplete  Surgical pcr screen     Status: None   Collection Time: 11/26/16 11:30 AM  Result Value Ref Range Status   MRSA, PCR NEGATIVE  NEGATIVE Final   Staphylococcus aureus NEGATIVE NEGATIVE Final    Comment:        The Xpert SA Assay (FDA approved for NASAL specimens in patients over 37 years of age), is one component of a comprehensive surveillance program.  Test performance has been validated by [facility] for patients greater than or equal to 23 year old. It is not intended to diagnose infection nor to guide or monitor treatment.   Aerobic/Anaerobic Culture (surgical/deep wound)     Status: None (Preliminary result)   Collection Time: 11/26/16  7:20 PM  Result Value Ref Range Status   Specimen Description WOUND  Final   Special Requests LEFT TOE  Final   Gram Stain   Final    ABUNDANT WBC PRESENT, PREDOMINANTLY PMN RARE GRAM POSITIVE COCCI IN PAIRS    Culture TOO YOUNG TO READ  Final   Report Status PENDING  Incomplete      Radiology Studies: Dg Foot Complete Left  Result Date: 11/25/2016 CLINICAL DATA:  Ulcer on lateral side of LEFT foot near little toe excreting pus question osteomyelitis EXAM: LEFT FOOT - COMPLETE 3+ VIEW COMPARISON:  11/22/2016 FINDINGS: Osseous demineralization. Bone destruction at the head of the fifth metatarsal consistent with osteomyelitis. Additional bone destruction at the lateral base of the proximal phalanx of the fifth toe compatible with osteomyelitis and involvement of the fifth MTP joint compatible with septic arthritis. Overlying soft tissue swelling. No additional fracture, dislocation or bone destruction. Bone destruction at the fifth metatarsal head is progressive since the previous exam, as is the destruction at the base of the proximal phalanx little toe. Tiny plantar calcaneal spur. IMPRESSION: Septic arthritis of the fifth MTP joint with osteomyelitis of the base of the proximal phalanx little toe and more prominently of the distal LEFT fifth metatarsal. Bone destruction is mildly progressive since 11/22/2016. Electronically Signed   By: Ulyses Southward M.D.   On: 11/25/2016  18:00    Scheduled Meds: . artificial tears   Both Eyes QHS  . buPROPion  300 mg Oral Daily  .  clonazePAM  0.5 mg Oral BID  . divalproex  1,000 mg Oral Daily  . docusate sodium  100 mg Oral BID  . feeding supplement (ENSURE ENLIVE)  237 mL Oral BID BM  . heparin  5,000 Units Subcutaneous Q8H  . insulin aspart  0-15 Units Subcutaneous TID WC  . insulin aspart  0-5 Units Subcutaneous QHS  . insulin detemir  26 Units Subcutaneous BH-q7a  . levothyroxine  200 mcg Oral QAC breakfast  . lipase/protease/amylase  24,000 Units Oral TID WC  . meloxicam  7.5 mg Oral Daily  . multivitamin with minerals  1 tablet Oral Daily  . piperacillin-tazobactam (ZOSYN)  IV  3.375 g Intravenous Q8H  . pregabalin  75 mg Oral TID  . ramipril  5 mg Oral Daily  . simvastatin  40 mg Oral QHS  . tamsulosin  0.4 mg Oral Daily  . vancomycin  1,000 mg Intravenous Q12H   Continuous Infusions: . sodium chloride 50 mL/hr at 11/26/16 2221  . dextrose 5 % and 0.45% NaCl 125 mL/hr at 11/26/16 0939  . lactated ringers 10 mL/hr at 11/26/16 1703     LOS: 2 days   Time spent: 25 minutes.  Hazeline Junker, MD [facility] Pager [phone]  If 7PM-7AM, please contact night-coverage [email or web address] Password [email or web address] 11/27/2016, 3:12 PM

## 2016-11-27 NOTE — Progress Notes (Signed)
   Preoperative Note   Procedure: Aortogram, bilateral leg runoff, and possible left leg intervention  Date: 11/28/16  Preoperative diagnosis: Left foot gangrene  Consent: ordered  Laboratory:  CBC:    Component Value Date/Time   WBC 7.7 11/25/2016 2212   RBC 3.72 (L) 11/25/2016 2212   HGB 10.8 (L) 11/25/2016 2212   HCT 33.4 (L) 11/25/2016 2212   PLT 375 11/25/2016 2212   MCV 89.8 11/25/2016 2212   MCH 29.0 11/25/2016 2212   MCHC 32.3 11/25/2016 2212   RDW 12.5 11/25/2016 2212   LYMPHSABS 1.8 11/25/2016 1410   MONOABS 0.6 11/25/2016 1410   EOSABS 0.1 11/25/2016 1410   BASOSABS 0.0 11/25/2016 1410    BMP:    Component Value Date/Time   NA 136 11/25/2016 1410   K 4.0 11/25/2016 1410   CL 101 11/25/2016 1410   CO2 28 11/25/2016 1410   GLUCOSE 237 (H) 11/25/2016 1410   BUN 11 11/25/2016 1410   CREATININE 0.74 11/25/2016 2212   CALCIUM 8.5 (L) 11/25/2016 1410   GFRNONAA >60 11/25/2016 2212   GFRAA >60 11/25/2016 2212    Coagulation: No results found for: INR, PROTIME No results found for: PTT   Leonides Sake, MD, FACS [facility] Office: [phone] Pager: [phone]  11/27/2016, 8:32 AM

## 2016-11-28 ENCOUNTER — Encounter (HOSPITAL_COMMUNITY)
Admission: EM | Disposition: A | Discharge status: Skilled Nursing Facility | Payer: Self-pay | Source: Home / Self Care | Attending: Family Medicine

## 2016-11-28 ENCOUNTER — Encounter (HOSPITAL_COMMUNITY): Payer: Self-pay | Admitting: Vascular Surgery

## 2016-11-28 DIAGNOSIS — I70245 Atherosclerosis of native arteries of left leg with ulceration of other part of foot: Secondary | ICD-10-CM

## 2016-11-28 HISTORY — PX: PERIPHERAL VASCULAR INTERVENTION: CATH118257

## 2016-11-28 HISTORY — PX: LOWER EXTREMITY ANGIOGRAPHY: CATH118251

## 2016-11-28 LAB — BASIC METABOLIC PANEL
Anion gap: 10 (ref 5–15)
BUN: 13 mg/dL (ref 6–20)
CHLORIDE: 104 mmol/L (ref 101–111)
CO2: 23 mmol/L (ref 22–32)
CREATININE: 0.83 mg/dL (ref 0.61–1.24)
Calcium: 8.8 mg/dL — ABNORMAL LOW (ref 8.9–10.3)
GFR calc Af Amer: >60 mL/min (ref >60)
GFR calc non Af Amer: >60 mL/min (ref >60)
GLUCOSE: 124 mg/dL — AB (ref 65–99)
POTASSIUM: 4.1 mmol/L (ref 3.5–5.1)
Sodium: 137 mmol/L (ref 135–145)

## 2016-11-28 LAB — AEROBIC CULTURE W GRAM STAIN (SUPERFICIAL SPECIMEN)

## 2016-11-28 LAB — CBC
HCT: 35.5 % — ABNORMAL LOW (ref 39.0–52.0)
HEMATOCRIT: 34.2 % — AB (ref 39.0–52.0)
HEMOGLOBIN: 11.5 g/dL — AB (ref 13.0–17.0)
Hemoglobin: 11.1 g/dL — ABNORMAL LOW (ref 13.0–17.0)
MCH: 29.8 pg (ref 26.0–34.0)
MCH: 29.4 pg (ref 26.0–34.0)
MCHC: 32.5 g/dL (ref 30.0–36.0)
MCHC: 32.4 g/dL (ref 30.0–36.0)
MCV: 91.9 fL (ref 78.0–100.0)
MCV: 90.8 fL (ref 78.0–100.0)
PLATELETS: 362 10*3/uL (ref 150–400)
Platelets: 408 10*3/uL — ABNORMAL HIGH (ref 150–400)
RBC: 3.72 MIL/uL — ABNORMAL LOW (ref 4.22–5.81)
RBC: 3.91 MIL/uL — AB (ref 4.22–5.81)
RDW: 13 % (ref 11.5–15.5)
RDW: 12.7 % (ref 11.5–15.5)
WBC: 7.9 10*3/uL (ref 4.0–10.5)
WBC: 7.3 10*3/uL (ref 4.0–10.5)

## 2016-11-28 LAB — GLUCOSE, CAPILLARY
GLUCOSE-CAPILLARY: 289 mg/dL — AB (ref 65–99)
Glucose-Capillary: 178 mg/dL — ABNORMAL HIGH (ref 65–99)
Glucose-Capillary: 78 mg/dL (ref 65–99)
Glucose-Capillary: 162 mg/dL — ABNORMAL HIGH (ref 65–99)

## 2016-11-28 LAB — POCT ACTIVATED CLOTTING TIME
ACTIVATED CLOTTING TIME: 202 s
Activated Clotting Time: 175 seconds

## 2016-11-28 LAB — AEROBIC CULTURE  (SUPERFICIAL SPECIMEN): GRAM STAIN: NONE SEEN

## 2016-11-28 LAB — CREATININE, SERUM
Creatinine, Ser: 0.8 mg/dL (ref 0.61–1.24)
GFR calc Af Amer: >60 mL/min (ref >60)
GFR calc non Af Amer: >60 mL/min (ref >60)

## 2016-11-28 SURGERY — LOWER EXTREMITY ANGIOGRAPHY
Anesthesia: LOCAL

## 2016-11-28 MED ORDER — LIDOCAINE HCL (PF) 1 % IJ SOLN
INTRAMUSCULAR | Status: DC | PRN
  Administered 2016-11-28: 12 mL via SUBCUTANEOUS

## 2016-11-28 MED ORDER — LIDOCAINE HCL (PF) 1 % IJ SOLN
INTRAMUSCULAR | Status: AC
  Filled 2016-11-28: qty 30

## 2016-11-28 MED ORDER — MIDAZOLAM HCL 2 MG/2ML IJ SOLN
INTRAMUSCULAR | Status: DC | PRN
  Administered 2016-11-28: 1 mg via INTRAVENOUS

## 2016-11-28 MED ORDER — SODIUM CHLORIDE 0.9 % IV SOLN
1.0000 mL/kg/h | INTRAVENOUS | Status: AC
  Administered 2016-11-28: 1 mL/kg/h via INTRAVENOUS

## 2016-11-28 MED ORDER — HEPARIN SODIUM (PORCINE) 1000 UNIT/ML IJ SOLN
INTRAMUSCULAR | Status: DC | PRN
  Administered 2016-11-28: 6000 [IU] via INTRAVENOUS

## 2016-11-28 MED ORDER — FENTANYL CITRATE (PF) 100 MCG/2ML IJ SOLN
INTRAMUSCULAR | Status: AC
  Filled 2016-11-28: qty 2

## 2016-11-28 MED ORDER — INSULIN ASPART 100 UNIT/ML ~~LOC~~ SOLN
3.0000 [IU] | Freq: Three times a day (TID) | SUBCUTANEOUS | Status: DC
  Administered 2016-11-28 – 2016-11-30 (×5): 3 [IU] via SUBCUTANEOUS

## 2016-11-28 MED ORDER — FENTANYL CITRATE (PF) 100 MCG/2ML IJ SOLN
INTRAMUSCULAR | Status: DC | PRN
  Administered 2016-11-28: 50 ug via INTRAVENOUS

## 2016-11-28 MED ORDER — HEPARIN (PORCINE) IN NACL 2-0.9 UNIT/ML-% IJ SOLN
INTRAMUSCULAR | Status: AC
  Filled 2016-11-28: qty 1000

## 2016-11-28 MED ORDER — MIDAZOLAM HCL 2 MG/2ML IJ SOLN
INTRAMUSCULAR | Status: AC
  Filled 2016-11-28: qty 2

## 2016-11-28 MED ORDER — HEPARIN SODIUM (PORCINE) 1000 UNIT/ML IJ SOLN
INTRAMUSCULAR | Status: AC
  Filled 2016-11-28: qty 1

## 2016-11-28 MED ORDER — IODIXANOL 320 MG/ML IV SOLN
INTRAVENOUS | Status: DC | PRN
  Administered 2016-11-28: 150 mL via INTRA_ARTERIAL

## 2016-11-28 SURGICAL SUPPLY — 17 items
CATH CROSS OVER TEMPO 5F (CATHETERS) ×1 IMPLANT
CATH OMNI FLUSH 5F 65CM (CATHETERS) ×1 IMPLANT
CATH SOFT-VU 4F 65 STRAIGHT (CATHETERS) IMPLANT
CATH SOFT-VU STRAIGHT 4F 65CM (CATHETERS) ×3
DEVICE CONTINUOUS FLUSH (MISCELLANEOUS) ×1 IMPLANT
GUIDEWIRE ANGLED .035X150CM (WIRE) ×1 IMPLANT
KIT ENCORE 26 ADVANTAGE (KITS) ×1 IMPLANT
KIT MICROINTRODUCER STIFF 5F (SHEATH) ×1 IMPLANT
KIT PV (KITS) ×3 IMPLANT
SHEATH PINNACLE 5F 10CM (SHEATH) ×1 IMPLANT
SHEATH PINNACLE ST 6F 45CM (SHEATH) ×1 IMPLANT
STENT OMNILINK ELITE 6X19X80 (Permanent Stent) ×1 IMPLANT
SYR MEDRAD MARK V 150ML (SYRINGE) ×3 IMPLANT
TRANSDUCER W/STOPCOCK (MISCELLANEOUS) ×3 IMPLANT
TRAY PV CATH (CUSTOM PROCEDURE TRAY) ×3 IMPLANT
WIRE HITORQ VERSACORE ST 145CM (WIRE) ×1 IMPLANT
WIRE ROSEN-J .035X180CM (WIRE) ×1 IMPLANT

## 2016-11-28 NOTE — H&P (View-Only) (Signed)
Referred by:  Dr. Jarvis Newcomer ([facility])  Reason for referral: BLE PAD   History of Present Illness  Ralph [name] is a 66 y.o. ([birth year]) male diabetic who presents with chief complaint: left foot wound.  Onset of symptom occurred >2 weeks ago.  The patient is unaware of the chronology as he is insensate in the Left foot.  Reportedly an aide at this patient's SNF notes drainage from his left foot from a blister overlying his left 5th toe and suggested he get evaluated.  Patient notes neuropathic pain that is random without obvious trigger, severeity is mod-severe.  Pt denies any fever or chills.  Patient has not attempted to treat this wound as he wasn't even aware of it.  Prior to developing this blister, the patient denies any intermittent claudication or rest pain.  He is mostly sedentary but does walk some.  Atherosclerotic risk factors include: DM, HTN, and prior smoker.  Past Medical History:  Diagnosis Date  . Anxiety   . BPH (benign prostatic hyperplasia)   . Depression   . Diabetes mellitus without complication (HCC)   . Hypertension   . Thyroid disease    hypothyroid    Past Surgical History:  Procedure Laterality Date  . CATARACT EXTRACTION    . EYE SURGERY    . thyroid ablation      Social History   Social History  . Marital status: Married    Spouse name: N/A  . Number of children: N/A  . Years of education: N/A   Occupational History  . Not on file.   Social History Main Topics  . Smoking status: Former Games developer  . Smokeless tobacco: Never Used  . Alcohol use No  . Drug use: No  . Sexual activity: Not on file   Other Topics Concern  . Not on file   Social History Narrative  . No narrative on file    Family History  Problem Relation Age of Onset  . Diabetes Mellitus II Mother   . Heart disease Father   . Lung cancer Father   . Diabetes Mellitus II Paternal Grandfather     Current Facility-Administered Medications  Medication Dose  Route Frequency Provider Last Rate Last Dose  . artificial tears (LACRILUBE) ophthalmic ointment   Both Eyes QHS Inez Catalina, MD      . buPROPion (WELLBUTRIN XL) 24 hr tablet 300 mg  300 mg Oral Daily Inez Catalina, MD      . clonazePAM Scarlette Calico) tablet 0.5 mg  0.5 mg Oral BID Inez Catalina, MD   0.5 mg at 11/25/16 2256  . dextrose 5 %-0.45 % sodium chloride infusion   Intravenous Continuous Tyrone Nine, MD 125 mL/hr at 11/26/16 [phone]    . divalproex (DEPAKOTE ER) 24 hr tablet 1,000 mg  1,000 mg Oral Daily Inez Catalina, MD      . docusate sodium (COLACE) capsule 100 mg  100 mg Oral BID Inez Catalina, MD   100 mg at 11/25/16 2306  . heparin injection 5,000 Units  5,000 Units Subcutaneous Q8H Inez Catalina, MD   5,000 Units at 11/26/16 [phone]  . hydrALAZINE (APRESOLINE) tablet 10 mg  10 mg Oral Q8H PRN Inez Catalina, MD      . HYDROcodone-acetaminophen (NORCO/VICODIN) 5-325 MG per tablet 1-2 tablet  1-2 tablet Oral Q4H PRN Inez Catalina, MD   2 tablet at 11/25/16 2258  . insulin aspart (novoLOG) injection 0-15 Units  0-15 Units Subcutaneous TID WC Inez Catalina, MD      . insulin aspart (novoLOG) injection 0-5 Units  0-5 Units Subcutaneous QHS Inez Catalina, MD      . insulin detemir (LEVEMIR) injection 26 Units  26 Units Subcutaneous Lesle Reek Inez Catalina, MD   26 Units at 11/26/16 [phone]  . levothyroxine (SYNTHROID, LEVOTHROID) tablet 200 mcg  200 mcg Oral QAC breakfast Inez Catalina, MD   200 mcg at 11/26/16 0743  . lipase/protease/amylase (CREON) capsule 24,000 Units  24,000 Units Oral TID WC Inez Catalina, MD      . meloxicam [facility]) tablet 7.5 mg  7.5 mg Oral Daily Inez Catalina, MD      . multivitamin with minerals tablet 1 tablet  1 tablet Oral Daily Inez Catalina, MD      . ondansetron [facility]) tablet 4 mg  4 mg Oral Q6H PRN Inez Catalina, MD       Or  . ondansetron [facility]) injection 4 mg  4 mg Intravenous Q6H PRN Inez Catalina, MD      . piperacillin-tazobactam (ZOSYN)  IVPB 3.375 g  3.375 g Intravenous Q8H Nathan J Batchelder, RPH      . polyethylene glycol (MIRALAX / GLYCOLAX) packet 17 g  17 g Oral Daily PRN Inez Catalina, MD      . pregabalin (LYRICA) capsule 75 mg  75 mg Oral TID Inez Catalina, MD   75 mg at 11/25/16 2306  . ramipril (ALTACE) capsule 5 mg  5 mg Oral Daily Inez Catalina, MD      . simvastatin (ZOCOR) tablet 40 mg  40 mg Oral QHS Inez Catalina, MD   40 mg at 11/25/16 2306  . tamsulosin (FLOMAX) capsule 0.4 mg  0.4 mg Oral Daily Inez Catalina, MD      . vancomycin (VANCOCIN) IVPB 1000 mg/200 mL premix  1,000 mg Intravenous Q12H Inez Catalina, MD        Allergies  Allergen Reactions  . Antihistamines, Chlorpheniramine-Type Other (See Comments)    Per MAR  . Antihistamines, Diphenhydramine-Type Other (See Comments)    Per MAR  . Antihistamines, Loratadine-Type Other (See Comments)    Per MAR  . Benadryl [Diphenhydramine Hcl] Other (See Comments)  . Ethanolamine Other (See Comments)    Per MAR     REVIEW OF SYSTEMS:   Cardiac:  positive for: no symptoms, negative for: Chest pain or chest pressure, Shortness of breath upon exertion and Shortness of breath when lying flat,   Vascular:  positive for: Leg swelling,  negative for: Pain in calf, thigh, or hip brought on by ambulation, Pain in feet at night that wakes you up from your sleep and Blood clot in your veins  Pulmonary:  positive for: no symptoms,  negative for: Oxygen at home, Productive cough and Wheezing  Neurologic:  positive for: chronic numbness in left foot, negative for: Sudden weakness in arms or legs, Sudden numbness in arms or legs, Sudden onset of difficulty speaking or slurred speech, Temporary loss of vision in one eye and Problems with dizziness  Gastrointestinal:  positive for: no symptoms, negative for: Blood in stool and Vomited blood  Genitourinary:  positive for: no symptoms, negative for: Burning when urinating and Blood in  urine  Psychiatric:  positive for: no symptoms,  negative for: Major depression  Hematologic:  positive for: no symptoms,  negative for: negative for: Bleeding problems and Problems with blood  clotting too easily  Dermatologic:  positive for: Rashes or ulcers, negative for: suspicious skin lesions  Constitutional:  positive for: no symptoms, negative for: Fever or chills  Ear/Nose/Throat:  positive for: no symptoms, negative for: Change in hearing, Nose bleeds and Sore throat  Musculoskeletal:  positive for: no symptoms, negative for: Back pain, Joint pain and Muscle pain   For VQI Use Only  PRE-ADM LIVING: Nursing home  AMB STATUS: Ambulatory  CAD Sx: None  PRIOR CHF: None  STRESS TEST: No   Physical Examination  Vitals:   11/25/16 2030 11/25/16 2130 11/25/16 2205 11/26/16 0540  BP: (!) 161/77 (!) 175/74  (!) 171/76  Pulse: 84 88  82  Resp: 18 16    Temp:  97.6 F (36.4 C)  97.5 F (36.4 C)  TempSrc:  Oral  Oral  SpO2: 99% 100%  99%  Weight:   187 lb 3.2 oz (84.9 kg)     Body mass index is 25.39 kg/m.  General: Alert, Somewhat Confused, WD, NAD  Head: [place]/AT, somewhat asx face  Ear/Nose/Throat: Hearing grossly intact, nares without erythema or drainage, oropharynx without Erythema or Exudate, Mallampati score: 3,   Eyes: PERRLA, EOMI,    Neck: Supple, mid-line trachea,    Pulmonary: Sym exp, good B air movt, CTA B  Cardiac: RRR, Nl S1, S2, no Murmurs, No rubs, No S3,S4  Vascular: Vessel Right Left  Radial Palpable Palpable  Brachial Palpable Palpable  Carotid Palpable Palpable  Aorta Not palpable due to pannus N/A  Femoral Palpable Faintly palpable  Popliteal Not palpable Not palpable  PT Not palpable Not palpable  DP Not palpable Not palpable   Gastrointestinal: soft, non-distended, non-tender to palpation, No guarding or rebound, no HSM, no masses, no CVAT B, No palpable prominent aortic pulse,    Musculoskeletal: M/S 5/5  throughout  , Extremities without ischemic changes except left 5th MT lateral ulcer: no frank pus, necrotic tissue evident, L foot edema 1-2+, erythema along dorsum of L foot with some ascending component  Neurologic: Cranial nerves 2-12 intact , Pain and light touch intact in extremities except feet: L foot sensation << R, Motor exam as listed above  Psychiatric: Judgement intact, Mood & affect appropriate for pt's clinical situation  Dermatologic: See M/S exam for extremity exam, No rashes otherwise noted  Lymph : Palpable lymph nodes: None   Non-Invasive Vascular Imaging  ABI (Date: 11/26/2016)  RIGHT    LEFT    PRESSURE WAVEFORM  PRESSURE WAVEFORM  BRACHIAL 173 Tri BRACHIAL 161 Tri  DP 119 Bi DP 134 Mono  PT 112 Mono PT 139 Mono  GREAT TOE 83 NA GREAT TOE 73 NA    RIGHT LEFT  ABI 0.69 0.8   L TBI 0.45  Laboratory: CBC:    Component Value Date/Time   WBC 7.7 11/25/2016 2212   RBC 3.72 (L) 11/25/2016 2212   HGB 10.8 (L) 11/25/2016 2212   HCT 33.4 (L) 11/25/2016 2212   PLT 375 11/25/2016 2212   MCV 89.8 11/25/2016 2212   MCH 29.0 11/25/2016 2212   MCHC 32.3 11/25/2016 2212   RDW 12.5 11/25/2016 2212   LYMPHSABS 1.8 11/25/2016 1410   MONOABS 0.6 11/25/2016 1410   EOSABS 0.1 11/25/2016 1410   BASOSABS 0.0 11/25/2016 1410    BMP:    Component Value Date/Time   NA 136 11/25/2016 1410   K 4.0 11/25/2016 1410   CL 101 11/25/2016 1410   CO2 28 11/25/2016 1410   GLUCOSE 237 (  H) 11/25/2016 1410   BUN 11 11/25/2016 1410   CREATININE 0.74 11/25/2016 2212   CALCIUM 8.5 (L) 11/25/2016 1410   GFRNONAA >60 11/25/2016 2212   GFRAA >60 11/25/2016 2212    Coagulation: No results found for: INR, PROTIME No results found for: PTT  Lipids: No results found for: CHOL, TRIG, HDL, CHOLHDL, VLDL, LDLCALC, LDLDIRECT  Radiology: Dg Foot Complete Left  Result Date: 11/25/2016 CLINICAL DATA:  Ulcer on lateral side of LEFT foot near little toe excreting pus question  osteomyelitis EXAM: LEFT FOOT - COMPLETE 3+ VIEW COMPARISON:  11/22/2016 FINDINGS: Osseous demineralization. Bone destruction at the head of the fifth metatarsal consistent with osteomyelitis. Additional bone destruction at the lateral base of the proximal phalanx of the fifth toe compatible with osteomyelitis and involvement of the fifth MTP joint compatible with septic arthritis. Overlying soft tissue swelling. No additional fracture, dislocation or bone destruction. Bone destruction at the fifth metatarsal head is progressive since the previous exam, as is the destruction at the base of the proximal phalanx little toe. Tiny plantar calcaneal spur. IMPRESSION: Septic arthritis of the fifth MTP joint with osteomyelitis of the base of the proximal phalanx little toe and more prominently of the distal LEFT fifth metatarsal. Bone destruction is mildly progressive since 11/22/2016. Electronically Signed   By: Ulyses Southward M.D.   On: 11/25/2016 18:00   I reviewed the L foot Xrays and there appears to be evidence of evidence of cortical loss of the 5th metatarsal head c/w osteomyelitis   Medical Decision Making  Ralph [name] is a 66 y.o. male who presents with: sub-optimally controlled diabetes, diabetic neuropathy, BLE moderate PAD, LLE critical limb ischemia in form ulcer leading to osteomyelitis 5th MT   Orthopedics has already been consulted by primary team, so I will defer to them the L 5th ray amputation that will likely be needed given the entire L foot appears have develop some infectious involvement.  I would not delay the amputation for revascularization in this case as the foot is already involved  Wide-spectrum IV abx   Further optimization of DM mgmt will be essential to getting a better outcome from any revascularization: Hg A1c to get an idea of baseline  L TBI was only 0.45 which suggests some further revascularization of the L foot will be needed to help heal the left foot.  I  discussed with the patient the natural history of critical limb ischemia: 25% require amputation in one year, 50% are able to maintain their limbs in one year, and 25-30% die in one year due to comorbidities.  Given the limb threatening status of this patient, I recommend an aggressive work up including proceeding with an: Aortogram, Bilateral runoff and possible intervention L leg I discussed with the patient the nature of angiographic procedures, especially the limited patencies of any endovascular intervention. The patient is aware of that the risks of an angiographic procedure include but are not limited to: bleeding, infection, access site complications, embolization, rupture of treated vessel, dissection, possible need for emergent surgical intervention, and possible need for surgical procedures to treat the patient's pathology. The patient is aware of the risks and agrees to proceed.  The procedure is scheduled for: Monday 2 APR 18 with Dr. Edilia Bo.  I discussed in depth with the patient the nature of atherosclerosis, and emphasized the importance of maximal medical management including strict control of blood pressure, blood glucose, and lipid levels, antiplatelet agents, obtaining regular exercise, and cessation of smoking.  The patient is aware that without maximal medical management the underlying atherosclerotic disease process will progress, limiting the benefit of any interventions. The patient is currently on a statin: Zocor.  The patient is currently not on an anti-platelet. Patient will be started on ASA 81 mg PO daily  Thank you for allowing [place] to participate in this patient's care.   Leonides Sake, MD, FACS [facility] [place] Office: [phone] Pager: [phone]  11/26/2016, 9:50 AM

## 2016-11-28 NOTE — Consult Note (Addendum)
WOC consult requested for foot wound prior to ortho service involvement.  They are now following for assessment and plan of care and pt had surgery performed on 3/31. Please refer to ortho team for further questions regarding post-op plan of care. Please re-consult if further assistance is needed.  Thank-you,  Cammie Mcgee MSN, RN, CWOCN, [place], CNS [phone]

## 2016-11-28 NOTE — Op Note (Signed)
PATIENT: Ralph [name]   MRN: [id number] DOB: June 08, 1951    DATE OF PROCEDURE: 11/28/2016  INDICATIONS: Ralph [name] is a 66 y.o. male with a nonhealing wound on his left foot. He was set up for arteriography and possible intervention. He had a preop to pressure 70 mmHg on the left. He had an absent left femoral pulse.  PROCEDURE:  1. Conscious sedation. 2. Ultrasound-guided access to the right common femoral artery 3. Aortogram with bilateral iliac arteriogram and bilateral lower extremity runoff 4. Selective catheterization of the left external iliac artery with angioplasty and stenting of a left common femoral artery stenosis using a 6 mm x 19 balloon expandable stent.  SURGEON: Di Kindle. Edilia Bo, MD, FACS  ANESTHESIA: Local with sedation   EBL: Minimal  TECHNIQUE: The patient was taken to the operating room and was sedated. The period of conscious sedation was 69 minutes.  During that time period, I was present face-to-face 100% of the time.  The patient was administered 1 mg of Versed and 50 g of fentanyl. The patient's heart rate, blood pressure, and oxygen saturation were monitored by the nurse continuously during the procedure.  Both groins were prepped and draped in usual sterile fashion. Under ultrasound guidance, after the skin was anesthetized, the right common femoral artery was cannulated with a micropuncture needle and a micropuncture sheath introduced over a wire. This was exchanged for a 5 [place] sheath over a Versacore wire. A pigtail catheter was positioned at the L1 vertebral body and flush aortogram obtained. The cath was positioned above the aortic bifurcation and oblique iliac projection was obtained. Next bilateral lower extremity runoff films were obtained.]  The patient had diffuse multilevel calcific disease. There was some generalized narrowing of the common iliac artery but no focal stenosis. There was a 90% stenosis of the distal left common iliac artery  which was heavily calcified. I felt that addressing this lesion would potentially increased perfusion enough to help facilitate wound healing of the left foot toe amputation site. I was able to get a Glidewire down the left external iliac artery and then exchanged this for a Rosen wire using a straight catheter. I then passed a 6 [place] destination sheath over the bifurcation down into the external iliac artery. The patient was then heparinized. ACT was 202.  I selected a 6 mm x 19 mm balloon expandable stent which was positioned across the stenosis. The balloon was inflated prolapse first one minute. This was then removed and completion films showed no residual stenosis.  The patient was transferred to the holding area for removal of the sheath. No immediate complications were noted.   FINDINGS:  1. There are single renal arteries bilaterally with no significant renal artery stenosis is then identified. The infrarenal aorta is widely patent. 2. On the left side, which is the site of concern, there is some mild diffuse disease of the left common iliac artery but no focal stenosis. The distal left common iliac artery had a 90% focal stenosis which was successfully ballooned and stented as described above. The hypogastric artery on the left was occluded. The external iliac artery was patent. There was moderate plaque in the common femoral artery on the left and also some mild disease in the proximal deep femoral artery. The superficial femoral artery was patent with some mild disease at the adductor canal. The below-knee popliteal artery was patent. There is two-vessel runoff on the left via the posterior tibial and peroneal arteries. The left  anterior tibial artery was occluded. 3. On the right side, the common iliac arteries patent with mild disease. The external iliac artery and hypogastric arteries are patent. The common femoral artery has moderate plaque. The deep femoral artery on the right is patent.  The superficial femoral artery on the right is occluded at the origin with reconstitution of the popliteal artery which has moderate disease. There is two-vessel runoff on the right via the posterior tibial and peroneal arteries.    PRE:  90% POST: 0% STENT: yes  Ralph Ferrari, MD, FACS [facility]  DATE OF DICTATION:   11/28/2016

## 2016-11-28 NOTE — Progress Notes (Signed)
PT Cancellation Note  Patient Details Name: Ralph [name] MRN: [id number] DOB: [birth year]   Cancelled Treatment:    Reason Eval/Treat Not Completed: Patient at procedure or test/unavailable (pt in surgery this am)   [facility] 11/28/2016, 9:39 AM  Courtney Paris. Beverely Risen PT, DPT Acute Rehabilitation  [phone] Pager [phone]

## 2016-11-28 NOTE — Progress Notes (Addendum)
Inpatient Diabetes Program Recommendations  AACE/ADA: New Consensus Statement on Inpatient Glycemic Control (2015)  Target Ranges:  Prepandial:   less than 140 mg/dL      Peak postprandial:   less than 180 mg/dL (1-2 hours)      Critically ill patients:  140 - 180 mg/dL  Results for [name], Ralph [name] (MRN [id number]) as of 11/28/2016 13:11  Ref. Range 11/27/2016 05:00 11/27/2016 08:37 11/27/2016 11:59 11/27/2016 16:11 11/27/2016 20:35 11/28/2016 07:06 11/28/2016 12:46  Glucose-Capillary Latest Ref Range: 65 - 99 mg/dL 409 (H) 88 811 (H) 914 (H) 258 (H) 178 (H) 78    Review of Glycemic Control  Diabetes history: DM2 Outpatient Diabetes medications: Levemir 26 unit sQAM, Victoza 1.8 mg QAM, Invokana 300 mg QAM, Metformin XR 1000 mg BID Current orders for Inpatient glycemic control: Levemir 26 units QAM, Novolog 0-15 units TID with meals, Novolog 0-5 units QHS  Inpatient Diabetes Program Recommendations: Insulin - Meal Coverage: When diet is resumed and patient is eating at least 50% of meals, please consider ordering Novolog 3 units TID with meals. HgbA1C: A1C 7.8% on 11/25/16 indicating an average glucose of 177 mg/dl over the past 2-3 months.  NOTE: Noted consult for diabetes coordinator per diabetic foot ulcer order set. Chart reviewed; recommendations noted above. Will continue to follow.   Thanks, Orlando Penner, RN, MSN, CDE Diabetes Coordinator Inpatient Diabetes Program [phone] (Team Pager from 8am to 5pm)

## 2016-11-28 NOTE — Interval H&P Note (Signed)
History and Physical Interval Note:  11/28/2016 10:09 AM  Ralph [name]  has presented today for surgery, with the diagnosis of claudication  The various methods of treatment have been discussed with the patient and family. After consideration of risks, benefits and other options for treatment, the patient has consented to  Procedure(s): Lower Extremity Angiography (N/A) as a surgical intervention .  The patient's history has been reviewed, patient examined, no change in status, stable for surgery.  I have reviewed the patient's chart and labs.  Questions were answered to the patient's satisfaction.     [name]

## 2016-11-28 NOTE — Progress Notes (Signed)
PROGRESS NOTE  Ralph [name]  UJW:[id number] DOB: Jun 26, 1951 DOA: 11/25/2016 PCP: Michiel Sites, MD   Brief Narrative: Ralph [name] is a 66 y.o. male with a history of T2DM, BPH, HTN, hypothyroidism (hyperthyroidism s/p ablation), depression and anxiety who presented 3/30 for left foot wound, swelling, erythema and pain. He developed a blister on the lateral aspect of the left foot near the toe about 2 weeks ago that has worsened after buying new shoes that were too narrow. He is insensate at the toes and distal portion of the foot and had neuropathic pain in the other parts of the foot. A custodian at his SNF noticed increased swelling and erythema and advised that he get seen. Of note, he was seen on 09/21/16 by his podiatrist and there was no wound noted at that time. On arrival, hydrocodone improved the pain. Lactic acid noted to be 2.01 which improved with fluids.  He was given a dose of Abx. A foot xray showed likely septic arthritis and osteomyelitis. Orthopedics performed left 5th toe ray amputation 3/31 and IV antibiotics have been continued. Preliminary ABI data demonstrated ABIs of 0.8 on the left (affected side) and 0.69 on the right. Vascular surgery is planning aortogram 4/2.   Assessment & Plan: Active Problems:   Open wound of left foot   Thyroid disease   Hypertension   Diabetes mellitus without complication (HCC)   Depression   BPH (benign prostatic hyperplasia)   Anxiety   Malnutrition of moderate degree   Septic arthritis of foot (HCC)  Diabetic foot wound with radiographic evidence of osteomyelitis, concern for septic arthritis: HIV non-reactive. - Orthopedics consulted: s/p 5th toe ray amputation 3/31.  - Monitor blood cultures.  - Monitor operative wound cultures: Growing enterococcus faecalis, suspect this will be susceptible to zosyn. - Superficial wound cultures also obtained: Growing MRS, sensitive to vanc on culture.  - Continue zosyn and vancomycin for  coverage of possible MRSA (SNF resident). - Prealbumin slightly low at 16.5: Nutrition consult - Vicodin for pain  PAD: See full report from aortogram below on 11/28/2016 by Dr. Edilia Bo. Mild diffuse disease noted in bilateral LE's with 90% focal stenosis of distal left common iliac artery s/p successful balloon and stenting. Preliminary ABI data demonstrated ABIs of 0.8 on the left (affected side) and 0.69 on the right.  - Vascular surgery planning aortogram and possible intervention 4/2. - LDL 74 not on statin, on ASA.  IDT2DM with peripheral neuropathy: Reports good control. HbA1c 7.8%. - Holding metformin, invokana, victoza. - Continue home dose of levemir, 26 units qAM and SSI. Suspect hyperglycemia from acute infection, will add mealtime insulin and monitor.  - Continue pregabalin    Hypothyroidism: TSH 2.565 - Continue synthroid    Hypertension - Continue ramipril , consider additional agent if BPs stay high (?from pain) - PRN hydralazine    Depression and anxiety - Continue home regimen: bupropion, clonazepam, depakote    BPH (benign prostatic hyperplasia) - Continue tamsulosin   Constipation - Colace scheduled - PRN miralax.   DVT prophylaxis: Heparin Code Status: Full Family Communication: None at bedside; ward of the state Disposition Plan: Uncertain timing, will likely DC back to SNF  Consultants:   Orthopedics, Dr. Ranell Patrick  Vascular Surgery, Dr. Edilia Bo  Procedures:   Dr. Edilia Bo 11/28/2016: - Ultrasound-guided access to the right common femoral artery - Aortogram with bilateral iliac arteriogram and bilateral lower extremity runoff - Selective catheterization of the left external iliac artery with angioplasty and stenting of  a left common femoral artery stenosis using a 6 mm x 19 balloon expandable stent. FINDINGS:  1. There are single renal arteries bilaterally with no significant renal artery stenosis is then identified. The infrarenal aorta is widely  patent. 2. On the left side, which is the site of concern, there is some mild diffuse disease of the left common iliac artery but no focal stenosis. The distal left common iliac artery had a 90% focal stenosis which was successfully ballooned and stented as described above. The hypogastric artery on the left was occluded. The external iliac artery was patent. There was moderate plaque in the common femoral artery on the left and also some mild disease in the proximal deep femoral artery. The superficial femoral artery was patent with some mild disease at the adductor canal. The below-knee popliteal artery was patent. There is two-vessel runoff on the left via the posterior tibial and peroneal arteries. The left anterior tibial artery was occluded. 3. On the right side, the common iliac arteries patent with mild disease. The external iliac artery and hypogastric arteries are patent. The common femoral artery has moderate plaque. The deep femoral artery on the right is patent. The superficial femoral artery on the right is occluded at the origin with reconstitution of the popliteal artery which has moderate disease. There is two-vessel runoff on the right via the posterior tibial and peroneal arteries.    PRE:  90% POST: 0% STENT: yes  Antimicrobials:  Ceftriaxone 3/30  Flagyl 3/30  Zosyn 3/31 >>   Vancomycin 3/30 >>    Subjective: Seen following procedure, reporting stable intermittent, sharp pain about the left foot without recent changes, controlled with medications. Appetite normal. No other complaints.  Objective: Vitals:   11/28/16 1345 11/28/16 1400 11/28/16 1430 11/28/16 1500  BP: (!) 161/107 99/84 (!) 159/79 (!) 175/67  Pulse: 91 90 97 91  Resp:      Temp:      TempSrc:      SpO2:      Weight:      Height:        Intake/Output Summary (Last 24 hours) at 11/28/16 1610 Last data filed at 11/28/16 1500  Gross per 24 hour  Intake              240 ml  Output              850  ml  Net             -610 ml   Filed Weights   11/25/16 2205 11/26/16 0800  Weight: 84.9 kg (187 lb 3.2 oz) 84.8 kg (187 lb)    Examination: General exam: 66 y.o. male in no distress Respiratory system: Non-labored breathing room air. Clear to auscultation bilaterally.  Cardiovascular system: Regular rate and rhythm. No murmur, rub, or gallop. No JVD. Gastrointestinal system: Abdomen soft, non-tender, non-distended, with normoactive bowel sounds. No organomegaly or masses felt. Central nervous system: Alert and oriented. No focal neurological deficits. Extremities: LLE edematous to calf with ill-defined irregular erythema extending circumferentially over dorsum of left foot. Dressing c/d/i. Poor sensation. Skin: As above, and left lower eyelid with mild irregularity, still able to close completely. Right femoral gauze dressing c/d/I.  Psychiatry: Judgement and insight appear normal. Cognitive impairment noted. Mood & affect appropriate.   Data Reviewed: I have personally reviewed following labs and imaging studies  CBC:  Recent Labs Lab 11/25/16 1410 11/25/16 2212 11/28/16 0600 11/28/16 1400  WBC 7.2 7.7 7.9 7.3  NEUTROABS 4.6  --   --   --   HGB 10.9* 10.8* 11.1* 11.5*  HCT 33.7* 33.4* 34.2* 35.5*  MCV 89.9 89.8 91.9 90.8  PLT 376 375 362 408*   Basic Metabolic Panel:  Recent Labs Lab 11/25/16 1410 11/25/16 2212 11/28/16 0600 11/28/16 1400  NA 136  --  137  --   K 4.0  --  4.1  --   CL 101  --  104  --   CO2 28  --  23  --   GLUCOSE 237*  --  124*  --   BUN 11  --  13  --   CREATININE 0.91 0.74 0.83 0.80  CALCIUM 8.5*  --  8.8*  --    GFR: Estimated Creatinine Clearance: 101 mL/min (by C-G formula based on SCr of 0.8 mg/dL). Liver Function Tests:  Recent Labs Lab 11/25/16 1410  AST 15  ALT 15*  ALKPHOS 59  BILITOT 0.6  PROT 6.5  ALBUMIN 2.8*   No results for input(s): LIPASE, AMYLASE in the last 168 hours. No results for input(s): AMMONIA in the last  168 hours. Coagulation Profile: No results for input(s): INR, PROTIME in the last 168 hours. Cardiac Enzymes: No results for input(s): CKTOTAL, CKMB, CKMBINDEX, TROPONINI in the last 168 hours. BNP (last 3 results) No results for input(s): PROBNP in the last 8760 hours. HbA1C:  Recent Labs  11/25/16 2212  HGBA1C 7.8*   CBG:  Recent Labs Lab 11/27/16 1159 11/27/16 1611 11/27/16 2035 11/28/16 0706 11/28/16 1246  GLUCAP 237* 197* 258* 178* 78   Lipid Profile:  Recent Labs  11/27/16 0431  CHOL 121  HDL 29*  LDLCALC 74  TRIG 89  CHOLHDL 4.2   Thyroid Function Tests:  Recent Labs  11/25/16 2212  TSH 2.565   Anemia Panel: No results for input(s): VITAMINB12, FOLATE, FERRITIN, TIBC, IRON, RETICCTPCT in the last 72 hours. Urine analysis:    Component Value Date/Time   COLORURINE YELLOW 02/01/2009 1754   APPEARANCEUR CLEAR 02/01/2009 1754   LABSPEC 1.040 (H) 02/01/2009 1754   PHURINE 5.5 02/01/2009 1754   GLUCOSEU >1000 (A) 02/01/2009 1754   HGBUR NEGATIVE 02/01/2009 1754   BILIRUBINUR NEGATIVE 02/01/2009 1754   KETONESUR TRACE (A) 02/01/2009 1754   PROTEINUR 30 (A) 02/01/2009 1754   UROBILINOGEN 1.0 02/01/2009 1754   NITRITE NEGATIVE 02/01/2009 1754   LEUKOCYTESUR NEGATIVE 02/01/2009 1754   Recent Results (from the past 240 hour(s))  Blood culture (routine x 2)     Status: None (Preliminary result)   Collection Time: 11/25/16  5:30 PM  Result Value Ref Range Status   Specimen Description BLOOD RIGHT WRIST  Final   Special Requests IN PEDIATRIC BOTTLE 3CC  Final   Culture NO GROWTH 3 DAYS  Final   Report Status PENDING  Incomplete  Blood culture (routine x 2)     Status: None (Preliminary result)   Collection Time: 11/25/16  5:35 PM  Result Value Ref Range Status   Specimen Description BLOOD LEFT ANTECUBITAL  Final   Special Requests BOTTLES DRAWN AEROBIC AND ANAEROBIC 5CC  Final   Culture NO GROWTH 3 DAYS  Final   Report Status PENDING  Incomplete    Aerobic Culture (superficial specimen)     Status: None   Collection Time: 11/26/16  3:14 AM  Result Value Ref Range Status   Specimen Description WOUND  Final   Special Requests LEFT FOOT  Final   Gram Stain   Final  NO WBC SEEN RARE GRAM POSITIVE COCCI IN PAIRS RARE SQUAMOUS EPITHELIAL CELLS PRESENT    Culture FEW METHICILLIN RESISTANT STAPHYLOCOCCUS AUREUS  Final   Report Status 11/28/2016 FINAL  Final   Organism ID, Bacteria METHICILLIN RESISTANT STAPHYLOCOCCUS AUREUS  Final      Susceptibility   Methicillin resistant staphylococcus aureus - MIC*    CIPROFLOXACIN >=8 RESISTANT Resistant     ERYTHROMYCIN >=8 RESISTANT Resistant     GENTAMICIN <=0.5 SENSITIVE Sensitive     OXACILLIN >=4 RESISTANT Resistant     TETRACYCLINE <=1 SENSITIVE Sensitive     VANCOMYCIN 1 SENSITIVE Sensitive     TRIMETH/SULFA >=320 RESISTANT Resistant     CLINDAMYCIN <=0.25 SENSITIVE Sensitive     RIFAMPIN <=0.5 SENSITIVE Sensitive     Inducible Clindamycin NEGATIVE Sensitive     * FEW METHICILLIN RESISTANT STAPHYLOCOCCUS AUREUS  Surgical pcr screen     Status: None   Collection Time: 11/26/16 11:30 AM  Result Value Ref Range Status   MRSA, PCR NEGATIVE NEGATIVE Final   Staphylococcus aureus NEGATIVE NEGATIVE Final    Comment:        The Xpert SA Assay (FDA approved for NASAL specimens in patients over 39 years of age), is one component of a comprehensive surveillance program.  Test performance has been validated by [facility] for patients greater than or equal to 43 year old. It is not intended to diagnose infection nor to guide or monitor treatment.   Aerobic/Anaerobic Culture (surgical/deep wound)     Status: None (Preliminary result)   Collection Time: 11/26/16  7:20 PM  Result Value Ref Range Status   Specimen Description WOUND  Final   Special Requests LEFT TOE  Final   Gram Stain   Final    ABUNDANT WBC PRESENT, PREDOMINANTLY PMN RARE GRAM POSITIVE COCCI IN PAIRS    Culture    Final    FEW ENTEROCOCCUS FAECALIS SUSCEPTIBILITIES TO FOLLOW    Report Status PENDING  Incomplete      Radiology Studies: No results found.  Scheduled Meds: . artificial tears   Both Eyes QHS  . buPROPion  300 mg Oral Daily  . clonazePAM  0.5 mg Oral BID  . divalproex  1,000 mg Oral Daily  . docusate sodium  100 mg Oral BID  . feeding supplement (ENSURE ENLIVE)  237 mL Oral BID BM  . heparin  5,000 Units Subcutaneous Q8H  . insulin aspart  0-15 Units Subcutaneous TID WC  . insulin aspart  0-5 Units Subcutaneous QHS  . insulin detemir  26 Units Subcutaneous BH-q7a  . levothyroxine  200 mcg Oral QAC breakfast  . lipase/protease/amylase  24,000 Units Oral TID WC  . meloxicam  7.5 mg Oral Daily  . multivitamin with minerals  1 tablet Oral Daily  . piperacillin-tazobactam (ZOSYN)  IV  3.375 g Intravenous Q8H  . pregabalin  75 mg Oral TID  . ramipril  5 mg Oral Daily  . simvastatin  40 mg Oral QHS  . tamsulosin  0.4 mg Oral Daily  . vancomycin  1,000 mg Intravenous Q12H   Continuous Infusions: . sodium chloride 50 mL/hr at 11/26/16 2221  . sodium chloride 1 mL/kg/hr (11/28/16 1326)  . dextrose 5 % and 0.45% NaCl 125 mL/hr at 11/26/16 0939  . lactated ringers 10 mL/hr at 11/26/16 1703     LOS: 3 days   Time spent: 25 minutes.  Hazeline Junker, MD [facility] Pager [phone]  If 7PM-7AM, please contact night-coverage [email or web address] Password  [email or web address] 11/28/2016, 4:10 PM

## 2016-11-28 NOTE — Progress Notes (Signed)
Site area: Right groin a 6 french long sheath was removed  Site Prior to Removal:  Level 0  Pressure Applied For 20 MINUTES    Bedrest Beginning at 1320p  Manual:   Yes.    Patient Status During Pull:  stable  Post Pull Groin Site:  Level 0  Post Pull Instructions Given:  Yes.    Post Pull Pulses Present:  Yes.    Dressing Applied:  Yes.    Comments:  VS remain stable during sheath pull.

## 2016-11-28 NOTE — Progress Notes (Signed)
Patient arrived to 2W room 1 from the cath lab  Telemetry monitor applied and CCMD notified.  Patient oriented to unit and room to include call light and phone.  Will continue to monitor

## 2016-11-29 ENCOUNTER — Telehealth: Payer: Self-pay | Admitting: Vascular Surgery

## 2016-11-29 DIAGNOSIS — M00072 Staphylococcal arthritis, left ankle and foot: Secondary | ICD-10-CM

## 2016-11-29 DIAGNOSIS — F329 Major depressive disorder, single episode, unspecified: Secondary | ICD-10-CM

## 2016-11-29 LAB — CBC
HCT: 32.5 % — ABNORMAL LOW (ref 39.0–52.0)
HEMOGLOBIN: 10.3 g/dL — AB (ref 13.0–17.0)
MCH: 28.7 pg (ref 26.0–34.0)
MCHC: 31.7 g/dL (ref 30.0–36.0)
MCV: 90.5 fL (ref 78.0–100.0)
Platelets: 384 10*3/uL (ref 150–400)
RBC: 3.59 MIL/uL — ABNORMAL LOW (ref 4.22–5.81)
RDW: 12.7 % (ref 11.5–15.5)
WBC: 6.9 10*3/uL (ref 4.0–10.5)

## 2016-11-29 LAB — BASIC METABOLIC PANEL
ANION GAP: 11 (ref 5–15)
BUN: 16 mg/dL (ref 6–20)
CALCIUM: 8.7 mg/dL — AB (ref 8.9–10.3)
CO2: 26 mmol/L (ref 22–32)
Chloride: 100 mmol/L — ABNORMAL LOW (ref 101–111)
Creatinine, Ser: 0.97 mg/dL (ref 0.61–1.24)
GFR calc Af Amer: >60 mL/min (ref >60)
GLUCOSE: 298 mg/dL — AB (ref 65–99)
Potassium: 4.2 mmol/L (ref 3.5–5.1)
Sodium: 137 mmol/L (ref 135–145)

## 2016-11-29 LAB — GLUCOSE, CAPILLARY
GLUCOSE-CAPILLARY: 251 mg/dL — AB (ref 65–99)
GLUCOSE-CAPILLARY: 149 mg/dL — AB (ref 65–99)
Glucose-Capillary: 199 mg/dL — ABNORMAL HIGH (ref 65–99)
Glucose-Capillary: 322 mg/dL — ABNORMAL HIGH (ref 65–99)

## 2016-11-29 LAB — HEMOGLOBIN A1C
HEMOGLOBIN A1C: 7.8 % — AB (ref 4.8–5.6)
MEAN PLASMA GLUCOSE: 177 mg/dL

## 2016-11-29 MED FILL — Heparin Sodium (Porcine) 2 Unit/ML in Sodium Chloride 0.9%: INTRAMUSCULAR | Qty: 1000 | Status: AC

## 2016-11-29 NOTE — Telephone Encounter (Signed)
-----   Message from Sharee Pimple, RN sent at 11/29/2016 11:02 AM EDT ----- Regarding: FW: f/u   ----- Message ----- From: Chuck Hint, MD Sent: 11/29/2016  10:15 AM To: Vvs Charge Pool Subject: f/u                                            He can be in [  ]'s on the green list.  I will need to see him in f/u with ABI's and a duplex of his stent in left leg in 3 months.   Thanks CD

## 2016-11-29 NOTE — Progress Notes (Signed)
   VASCULAR SURGERY ASSESSMENT & PLAN:   1 Day Post-Op s/p: Left CIA PTA/Stent. Now has left femoral pulse. Should have adequate circulation to heal the left toe amp.  Wound care left foot per ortho.  I will arrange f/u with me as an out-pt.  Vascular will be available as needed.  SUBJECTIVE:   No complaints  PHYSICAL EXAM:   Vitals:   11/28/16 1400 11/28/16 1430 11/28/16 1500 11/29/16 0409  BP: 99/84 (!) 159/79 (!) 175/67 (!) 156/61  Pulse: 90 97 91 82  Resp:    18  Temp:    98.6 F (37 C)  TempSrc:    Oral  SpO2:    98%  Weight:      Height:       Palpable left femoral pulse. Right groin- no hematoma.   LABS:   Lab Results  Component Value Date   WBC 6.9 11/29/2016   HGB 10.3 (L) 11/29/2016   HCT 32.5 (L) 11/29/2016   MCV 90.5 11/29/2016   PLT 384 11/29/2016   Lab Results  Component Value Date   CREATININE 0.97 11/29/2016   CBG (last 3)   Recent Labs  11/28/16 1634 11/28/16 2103 11/29/16 0611  GLUCAP 289* 162* 199*    PROBLEM LIST:    Active Problems:   Open wound of left foot   Thyroid disease   Hypertension   Diabetes mellitus without complication (HCC)   Depression   BPH (benign prostatic hyperplasia)   Anxiety   Malnutrition of moderate degree   Septic arthritis of foot (HCC)   CURRENT MEDS:   . artificial tears   Both Eyes QHS  . buPROPion  300 mg Oral Daily  . clonazePAM  0.5 mg Oral BID  . divalproex  1,000 mg Oral Daily  . docusate sodium  100 mg Oral BID  . feeding supplement (ENSURE ENLIVE)  237 mL Oral BID BM  . heparin  5,000 Units Subcutaneous Q8H  . insulin aspart  0-15 Units Subcutaneous TID WC  . insulin aspart  0-5 Units Subcutaneous QHS  . insulin aspart  3 Units Subcutaneous TID WC  . insulin detemir  26 Units Subcutaneous BH-q7a  . levothyroxine  200 mcg Oral QAC breakfast  . lipase/protease/amylase  24,000 Units Oral TID WC  . meloxicam  7.5 mg Oral Daily  . multivitamin with minerals  1 tablet Oral Daily  .  piperacillin-tazobactam (ZOSYN)  IV  3.375 g Intravenous Q8H  . pregabalin  75 mg Oral TID  . ramipril  5 mg Oral Daily  . simvastatin  40 mg Oral QHS  . tamsulosin  0.4 mg Oral Daily  . vancomycin  1,000 mg Intravenous Q12H    Cari Caraway Beeper: [phone] Office: [phone] 11/29/2016

## 2016-11-29 NOTE — Progress Notes (Signed)
ANTIBIOTIC CONSULT NOTE   Pharmacy Consult for Vanco/Zosyn Indication: foot and toe wound  Allergies  Allergen Reactions  . Antihistamines, Chlorpheniramine-Type Other (See Comments)    Per MAR  . Antihistamines, Diphenhydramine-Type Other (See Comments)    Per MAR  . Antihistamines, Loratadine-Type Other (See Comments)    Per MAR  . Benadryl [Diphenhydramine Hcl] Other (See Comments)  . Ethanolamine Other (See Comments)    Per [facility]    Patient Measurements: Height: 6' (182.9 cm) Weight: 187 lb (84.8 kg) IBW/kg (Calculated) : 77.6 Adjusted Body Weight:    Vital Signs: Temp: 98.6 F (37 C) (04/03 0409) Temp Source: Oral (04/03 0409) BP: 156/61 (04/03 0409) Pulse Rate: 82 (04/03 0409) Intake/Output from previous day: 04/02 0701 - 04/03 0700 In: 240 [P.O.:240] Out: 4050 [Urine:4050] Intake/Output from this shift: Total I/O In: 240 [P.O.:240] Out: 400 [Urine:400]  Labs:  Recent Labs  11/28/16 0600 11/28/16 1400 11/29/16 0304  WBC 7.9 7.3 6.9  HGB 11.1* 11.5* 10.3*  PLT 362 408* 384  CREATININE 0.83 0.80 0.97   Estimated Creatinine Clearance: 83.3 mL/min (by C-G formula based on SCr of 0.97 mg/dL). No results for input(s): VANCOTROUGH, VANCOPEAK, VANCORANDOM, GENTTROUGH, GENTPEAK, GENTRANDOM, TOBRATROUGH, TOBRAPEAK, TOBRARND, AMIKACINPEAK, AMIKACINTROU, AMIKACIN in the last 72 hours.   Microbiology:   Medical History: Past Medical History:  Diagnosis Date  . Anxiety   . BPH (benign prostatic hyperplasia)   . Depression   . Diabetes mellitus without complication (HCC)   . Hypertension   . Thyroid disease    hypothyroid    Assessment:  ID: Vanc D#5/Zosyn D#4 for septic arthritis/osteo of foot (open wound)- Afebrile, WBC WNL, SCr 0.97 (stable) - s/p toe amputation 3/31. Dose not charted 4/2 AM. - 4/2: s/p: Left CIA PTA/Stent. Now has left femoral pulse. Should have adequate circulation to heal the left toe amp.  Clindamycin3/30 x 1  Rocephin 3/30 >>  3/31 Flagyl 3/30 >> 3/31 3/30 Vanc >> Zosyn 3/31 >>   3/30 BCx: ngtd 3/31 L toe wound: Enterococcus faecalis 3/31  L foot wound: MRSA 3/31 MRSA - NEG  Goal of Therapy:  Vancomycin trough level 15-20 mcg/ml  Plan:  Continue Zosyn 3.375 gm IV q8h (4 hour infusion) Continue vancomycin 1g IV Q12h. Check trough when gets back to steady state (missed dose 4/2)  Roquel Burgin S. Merilynn Finland, PharmD, BCPS Clinical Staff Pharmacist Pager [phone]  Misty Stanley Stillinger 11/29/2016,10:49 AM

## 2016-11-29 NOTE — Care Management Note (Addendum)
Case Management Note Donn Pierini RN, BSN Unit 2W-Case Manager [phone]  Patient Details  Name: Ralph [name] MRN: [id number] Date of Birth: 03/28/1951  Subjective/Objective:  Pt admitted with open wound of foot concern for osteomyelitis and cellulitis s/p OR on 4/2 for  Left CIA PTA/Stent                 Action/Plan: PTA pt was at [facility] facility)- CSW consulted for return to SNF when medically stable. No CM needs noted at this time with plan to return to SNF  Expected Discharge Date:                  Expected Discharge Plan:  Skilled Nursing Facility  In-House Referral:  Clinical Social Work  Discharge planning Services  CM Consult  Post Acute Care Choice:  NA Choice offered to:  NA  DME Arranged:    DME Agency:     HH Arranged:    HH Agency:     Status of Service:  Completed, signed off  If discussed at [employer] of Stay Meetings, dates discussed:   Discharge Disposition: skilled facility     Additional Comments:  Darrold Span, RN 11/29/2016, 11:00 AM

## 2016-11-29 NOTE — Progress Notes (Signed)
qPhysical Therapy Treatment Patient Details Name: Ralph [name] MRN: [id number] DOB: 02/21/1951 Today's Date: 11/29/2016    History of Present Illness Pt is a 66 yo male admitted through ED on 11/25/16 with a open wound on LLE. Pt underwent a 5th ray ampuation on 11/26/16. PMH significant for thyroid disorder, HTN, DM2, Depression, BPH, Anxiety, septic OA. Pt s/p angiogram 11/28/16.     PT Comments    Pt progressing well toward goals with increase in ambulation distance. Pt requires constant verbal cues for WB through heel and safety with RW. Current d/c recommendation remains appropriate when medically ready. PT will continue to follow.    Follow Up Recommendations  Home health PT;Supervision for mobility/OOB     Equipment Recommendations  Rolling walker with 5" wheels    Recommendations for Other Services       Precautions / Restrictions Precautions Precautions: Fall Required Braces or Orthoses: Other Brace/Splint Other Brace/Splint: L post op shoe (max A to don sock and post op shoe on L) Restrictions Weight Bearing Restrictions: Yes LLE Weight Bearing: Weight bearing as tolerated (through heel)    Mobility  Bed Mobility Overal bed mobility: Modified Independent             General bed mobility comments: increased time and effort. HOB elevated  Transfers Overall transfer level: Needs assistance Equipment used: Rolling walker (2 wheeled) Transfers: Sit to/from Stand Sit to Stand: Min guard         General transfer comment: min guard for safety. cues for hand placement and safety with RW  Ambulation/Gait Ambulation/Gait assistance: Min guard Ambulation Distance (Feet): 100 Feet Assistive device: Rolling walker (2 wheeled) Gait Pattern/deviations: Step-through pattern;Decreased step length - right;Decreased stance time - left;Antalgic Gait velocity: decreased Gait velocity interpretation: Below normal speed for age/gender General Gait Details: min guard for  safety. verbal cues for WB through heel. mildly antalgic gait   Stairs            Wheelchair Mobility    Modified Rankin (Stroke Patients Only)       Balance Overall balance assessment: Needs assistance Sitting-balance support: No upper extremity supported;Feet supported Sitting balance-Leahy Scale: Good     Standing balance support: Bilateral upper extremity supported Standing balance-Leahy Scale: Fair Standing balance comment: able to stand static without assist, however, reliant on RW for any mobility                            Cognition Arousal/Alertness: Awake/alert Behavior During Therapy: [facility] for tasks assessed/performed;Anxious;Impulsive Overall Cognitive Status: History of cognitive impairments - at baseline                                 General Comments: requires frequent verbal cues for safety due to impulsivity and seeks reassurance of proper performance of acitivty due to anxiousness      Exercises      General Comments        Pertinent Vitals/Pain Pain Assessment: 0-10 Pain Score: 2  Pain Location: surgical site Pain Descriptors / Indicators: Sore Pain Intervention(s): Limited activity within patient's tolerance;Monitored during session;Repositioned    Home Living                      Prior Function            PT Goals (current goals can now be found in the  care plan section) Acute Rehab PT Goals Patient Stated Goal: get back home and safe PT Goal Formulation: With patient Time For Goal Achievement: 12/06/16 Progress towards PT goals: Progressing toward goals    Frequency    Min 3X/week      PT Plan Current plan remains appropriate;Frequency needs to be updated    Co-evaluation             End of Session Equipment Utilized During Treatment: Gait belt;Other (comment) (post op shoe) Activity Tolerance: Patient tolerated treatment well Patient left: in chair;with call bell/phone within  reach Nurse Communication: Mobility status PT Visit Diagnosis: Unsteadiness on feet (R26.81);Difficulty in walking, not elsewhere classified (R26.2)     Time: 1610-9604 PT Time Calculation (min) (ACUTE ONLY): 24 min  Charges:  $Gait Training: 23-37 mins                    G Codes:       Lane Hacker, SPT Acute Rehab SPT [phone]     Lane Hacker 11/29/2016, 9:24 AM

## 2016-11-29 NOTE — Telephone Encounter (Signed)
Scheduled 7/11@1pm

## 2016-11-29 NOTE — Progress Notes (Signed)
   Subjective: 1 Day Post-Op Procedure(s) (LRB): Lower Extremity Angiography (N/A) Peripheral Vascular Intervention (Left)  s/p left 5th toe ray amputation c/o mild soreness but otherwise stable Had vascular procedure yesterday Patient reports pain as mild.  Objective:   VITALS:   Vitals:   11/28/16 1500 11/29/16 0409  BP: (!) 175/67 (!) 156/61  Pulse: 91 82  Resp:  18  Temp:  98.6 F (37 C)    Left foot wound healing well nv intact in other toes No signs of drainage   LABS  Recent Labs  11/28/16 0600 11/28/16 1400 11/29/16 0304  HGB 11.1* 11.5* 10.3*  HCT 34.2* 35.5* 32.5*  WBC 7.9 7.3 6.9  PLT 362 408* 384     Recent Labs  11/28/16 0600 11/28/16 1400 11/29/16 0304  NA 137  --  137  K 4.1  --  4.2  BUN 13  --  16  CREATININE 0.83 0.80 0.97  GLUCOSE 124*  --  298*     Assessment/Plan: 1 Day Post-Op Procedure(s) (LRB): Lower Extremity Angiography (N/A) Peripheral Vascular Intervention (Left) s/p left 5th toe amputation Pt to f/u in the office in 1-2 weeks once discharged Continue antibiotics Will sign off for now but please contact for any further orthopedic concerns   Alphonsa Overall, MPAS, PA-C  11/29/2016, 8:34 AM

## 2016-11-29 NOTE — Progress Notes (Signed)
PROGRESS NOTE  KHOEN GENET  WUJ:[id number] DOB: [birth year] DOA: 11/25/2016 PCP: Michiel Sites, MD   Brief Narrative: Ralph [name] is a 66 y.o. male with a history of T2DM, BPH, HTN, hypothyroidism (hyperthyroidism s/p ablation), depression and anxiety who presented 3/30 for left foot wound, swelling, erythema and pain. He developed a blister on the lateral aspect of the left foot near the toe about 2 weeks ago that has worsened after buying new shoes that were too narrow. He is insensate at the toes and distal portion of the foot and had neuropathic pain in the other parts of the foot. A custodian at his SNF noticed increased swelling and erythema and advised that he get seen. Of note, he was seen on 09/21/16 by his podiatrist and there was no wound noted at that time. On arrival, hydrocodone improved the pain. Lactic acid noted to be 2.01 which improved with fluids.  He was given a dose of Abx. A foot xray showed likely septic arthritis and osteomyelitis. Orthopedics performed left 5th toe ray amputation 3/31 and IV antibiotics have been continued. Preliminary ABI data demonstrated ABIs of 0.8 on the left (affected side) and 0.69 on the right. Vascular surgery performed stenting to the left CIA on 4/2 with restoration of femoral pulse. IV antibiotics have been continued and will be transitioned to oral antibiotics on discharge based on culture sensitivities.   Assessment & Plan: Active Problems:   Open wound of left foot   Thyroid disease   Hypertension   Diabetes mellitus without complication (HCC)   Depression   BPH (benign prostatic hyperplasia)   Anxiety   Malnutrition of moderate degree   Septic arthritis of foot (HCC)  Diabetic foot wound with radiographic evidence of osteomyelitis, concern for septic arthritis: s/p left 5th toe ray amputation 3/31. HIV non-reactive. - Monitor blood cultures (NGTD at 4 days) - Monitor operative wound cultures: Growing enterococcus faecalis,  sensitive to vancomycin, so will DC zosyn. Plan to DC on oral abx. Superficial cultures grew MRSA. - Follow up with orthopedics in 1-2 weeks. Orthopedics signed off 4/3 - Vicodin for pain  PAD: S/p left CIA PTA/stent 11/28/2016 by Dr. Edilia Bo. Mild diffuse disease noted in bilateral LE's with 90% focal stenosis of distal left common iliac artery s/p successful balloon and stenting. Preliminary ABI data demonstrated ABIs of 0.8 on the left (affected side) and 0.69 on the right.  - Femoral pulse restored following revascularization. Vascular surgery to arrange follow up per Dr. Adele Dan note on 4/3.  - LDL 74 not on statin, on ASA.  IDT2DM with peripheral neuropathy: Reports good control. HbA1c 7.8%. - Holding metformin, invokana, victoza. - Continue home dose of levemir, 26 units qAM and SSI. Suspect hyperglycemia from acute infection, added mealtime insulin and monitoring.  - Continue pregabalin    Hypothyroidism: TSH 2.565 - Continue synthroid    Hypertension - Continue ramipril  - PRN hydralazine    Depression and anxiety - Continue home regimen: bupropion, clonazepam, depakote    BPH (benign prostatic hyperplasia) - Continue tamsulosin   Constipation - Colace scheduled - PRN miralax.   DVT prophylaxis: Heparin Code Status: Full Family Communication: None at bedside Disposition Plan: Medically cleared for discharge, will discharge on oral antibiotics back to [facility]. Pt is ward of the state and discharge planning is complicated, so anticipate DC 4/4. HH-PT and rolling walker recommended and ordered. Discussed with CSW.  Consultants:   Orthopedics, Dr. Ranell Patrick  Vascular Surgery, Dr. Edilia Bo  Procedures:  Dr. Edilia Bo 11/28/2016: - Ultrasound-guided access to the right common femoral artery - Aortogram with bilateral iliac arteriogram and bilateral lower extremity runoff - Selective catheterization of the left external iliac artery with angioplasty and stenting of  a left common femoral artery stenosis using a 6 mm x 19 balloon expandable stent. FINDINGS:  1. There are single renal arteries bilaterally with no significant renal artery stenosis is then identified. The infrarenal aorta is widely patent. 2. On the left side, which is the site of concern, there is some mild diffuse disease of the left common iliac artery but no focal stenosis. The distal left common iliac artery had a 90% focal stenosis which was successfully ballooned and stented as described above. The hypogastric artery on the left was occluded. The external iliac artery was patent. There was moderate plaque in the common femoral artery on the left and also some mild disease in the proximal deep femoral artery. The superficial femoral artery was patent with some mild disease at the adductor canal. The below-knee popliteal artery was patent. There is two-vessel runoff on the left via the posterior tibial and peroneal arteries. The left anterior tibial artery was occluded. 3. On the right side, the common iliac arteries patent with mild disease. The external iliac artery and hypogastric arteries are patent. The common femoral artery has moderate plaque. The deep femoral artery on the right is patent. The superficial femoral artery on the right is occluded at the origin with reconstitution of the popliteal artery which has moderate disease. There is two-vessel runoff on the right via the posterior tibial and peroneal arteries.    PRE:  90% POST: 0% STENT: yes  Antimicrobials:  Ceftriaxone 3/30  Flagyl 3/30  Zosyn 3/31 >> 4/3  Vancomycin 3/30 >>    Subjective: Pt reporting controlled, improving intermittent, sharp pain about the left foot. Appetite normal. No other complaints.  Objective: Vitals:   11/28/16 1500 11/29/16 0409 11/29/16 1053 11/29/16 1300  BP: (!) 175/67 (!) 156/61 (!) 151/62 (!) 139/57  Pulse: 91 82  79  Resp:  18  18  Temp:  98.6 F (37 C)  98.4 F (36.9 C)    TempSrc:  Oral  Oral  SpO2:  98%  98%  Weight:      Height:        Intake/Output Summary (Last 24 hours) at 11/29/16 1607 Last data filed at 11/29/16 1300  Gross per 24 hour  Intake              480 ml  Output             4550 ml  Net            -4070 ml   Filed Weights   11/25/16 2205 11/26/16 0800  Weight: 84.9 kg (187 lb 3.2 oz) 84.8 kg (187 lb)   Examination: General exam: 66 y.o. male in no distress Respiratory system: Non-labored breathing room air. Clear to auscultation bilaterally.  Cardiovascular system: Regular rate and rhythm. No murmur, rub, or gallop. No JVD. Gastrointestinal system: Abdomen soft, non-tender, non-distended, with normoactive bowel sounds. No organomegaly or masses felt. Central nervous system: Alert and oriented. No focal neurological deficits. Extremities: LLE edematous to calf with ill-defined irregular erythema significantly receded over left foot and ankle. Dressing c/d/i. Poor sensation. Skin: As above, and left lower eyelid with mild irregularity, still able to close completely. Psychiatry: Judgement and insight appear fair. Cognitive impairment noted. Mood & affect appropriate.   Data Reviewed:  I have personally reviewed following labs and imaging studies  CBC:  Recent Labs Lab 11/25/16 1410 11/25/16 2212 11/28/16 0600 11/28/16 1400 11/29/16 0304  WBC 7.2 7.7 7.9 7.3 6.9  NEUTROABS 4.6  --   --   --   --   HGB 10.9* 10.8* 11.1* 11.5* 10.3*  HCT 33.7* 33.4* 34.2* 35.5* 32.5*  MCV 89.9 89.8 91.9 90.8 90.5  PLT 376 375 362 408* 384   Basic Metabolic Panel:  Recent Labs Lab 11/25/16 1410 11/25/16 2212 11/28/16 0600 11/28/16 1400 11/29/16 0304  NA 136  --  137  --  137  K 4.0  --  4.1  --  4.2  CL 101  --  104  --  100*  CO2 28  --  23  --  26  GLUCOSE 237*  --  124*  --  298*  BUN 11  --  13  --  16  CREATININE 0.91 0.74 0.83 0.80 0.97  CALCIUM 8.5*  --  8.8*  --  8.7*   GFR: Estimated Creatinine Clearance: 83.3 mL/min  (by C-G formula based on SCr of 0.97 mg/dL). Liver Function Tests:  Recent Labs Lab 11/25/16 1410  AST 15  ALT 15*  ALKPHOS 59  BILITOT 0.6  PROT 6.5  ALBUMIN 2.8*   No results for input(s): LIPASE, AMYLASE in the last 168 hours. No results for input(s): AMMONIA in the last 168 hours. Coagulation Profile: No results for input(s): INR, PROTIME in the last 168 hours. Cardiac Enzymes: No results for input(s): CKTOTAL, CKMB, CKMBINDEX, TROPONINI in the last 168 hours. BNP (last 3 results) No results for input(s): PROBNP in the last 8760 hours. HbA1C:  Recent Labs  11/27/16 0431  HGBA1C 7.8*   CBG:  Recent Labs Lab 11/28/16 1246 11/28/16 1634 11/28/16 2103 11/29/16 0611 11/29/16 1116  GLUCAP 78 289* 162* 199* 251*   Lipid Profile:  Recent Labs  11/27/16 0431  CHOL 121  HDL 29*  LDLCALC 74  TRIG 89  CHOLHDL 4.2   Thyroid Function Tests: No results for input(s): TSH, T4TOTAL, FREET4, T3FREE, THYROIDAB in the last 72 hours. Anemia Panel: No results for input(s): VITAMINB12, FOLATE, FERRITIN, TIBC, IRON, RETICCTPCT in the last 72 hours. Urine analysis:    Component Value Date/Time   COLORURINE YELLOW 02/01/2009 1754   APPEARANCEUR CLEAR 02/01/2009 1754   LABSPEC 1.040 (H) 02/01/2009 1754   PHURINE 5.5 02/01/2009 1754   GLUCOSEU >1000 (A) 02/01/2009 1754   HGBUR NEGATIVE 02/01/2009 1754   BILIRUBINUR NEGATIVE 02/01/2009 1754   KETONESUR TRACE (A) 02/01/2009 1754   PROTEINUR 30 (A) 02/01/2009 1754   UROBILINOGEN 1.0 02/01/2009 1754   NITRITE NEGATIVE 02/01/2009 1754   LEUKOCYTESUR NEGATIVE 02/01/2009 1754   Recent Results (from the past 240 hour(s))  Blood culture (routine x 2)     Status: None (Preliminary result)   Collection Time: 11/25/16  5:30 PM  Result Value Ref Range Status   Specimen Description BLOOD RIGHT WRIST  Final   Special Requests IN PEDIATRIC BOTTLE 3CC  Final   Culture NO GROWTH 4 DAYS  Final   Report Status PENDING  Incomplete    Blood culture (routine x 2)     Status: None (Preliminary result)   Collection Time: 11/25/16  5:35 PM  Result Value Ref Range Status   Specimen Description BLOOD LEFT ANTECUBITAL  Final   Special Requests BOTTLES DRAWN AEROBIC AND ANAEROBIC 5CC  Final   Culture NO GROWTH 4 DAYS  Final  Report Status PENDING  Incomplete  Aerobic Culture (superficial specimen)     Status: None   Collection Time: 11/26/16  3:14 AM  Result Value Ref Range Status   Specimen Description WOUND  Final   Special Requests LEFT FOOT  Final   Gram Stain   Final    NO WBC SEEN RARE GRAM POSITIVE COCCI IN PAIRS RARE SQUAMOUS EPITHELIAL CELLS PRESENT    Culture FEW METHICILLIN RESISTANT STAPHYLOCOCCUS AUREUS  Final   Report Status 11/28/2016 FINAL  Final   Organism ID, Bacteria METHICILLIN RESISTANT STAPHYLOCOCCUS AUREUS  Final      Susceptibility   Methicillin resistant staphylococcus aureus - MIC*    CIPROFLOXACIN >=8 RESISTANT Resistant     ERYTHROMYCIN >=8 RESISTANT Resistant     GENTAMICIN <=0.5 SENSITIVE Sensitive     OXACILLIN >=4 RESISTANT Resistant     TETRACYCLINE <=1 SENSITIVE Sensitive     VANCOMYCIN 1 SENSITIVE Sensitive     TRIMETH/SULFA >=320 RESISTANT Resistant     CLINDAMYCIN <=0.25 SENSITIVE Sensitive     RIFAMPIN <=0.5 SENSITIVE Sensitive     Inducible Clindamycin NEGATIVE Sensitive     * FEW METHICILLIN RESISTANT STAPHYLOCOCCUS AUREUS  Surgical pcr screen     Status: None   Collection Time: 11/26/16 11:30 AM  Result Value Ref Range Status   MRSA, PCR NEGATIVE NEGATIVE Final   Staphylococcus aureus NEGATIVE NEGATIVE Final    Comment:        The Xpert SA Assay (FDA approved for NASAL specimens in patients over 53 years of age), is one component of a comprehensive surveillance program.  Test performance has been validated by [facility] for patients greater than or equal to 54 year old. It is not intended to diagnose infection nor to guide or monitor treatment.    Aerobic/Anaerobic Culture (surgical/deep wound)     Status: None (Preliminary result)   Collection Time: 11/26/16  7:20 PM  Result Value Ref Range Status   Specimen Description WOUND  Final   Special Requests LEFT TOE  Final   Gram Stain   Final    ABUNDANT WBC PRESENT, PREDOMINANTLY PMN RARE GRAM POSITIVE COCCI IN PAIRS    Culture   Final    FEW ENTEROCOCCUS FAECALIS NO ANAEROBES ISOLATED; CULTURE IN PROGRESS FOR 5 DAYS    Report Status PENDING  Incomplete   Organism ID, Bacteria ENTEROCOCCUS FAECALIS  Final      Susceptibility   Enterococcus faecalis - MIC*    AMPICILLIN <=2 SENSITIVE Sensitive     VANCOMYCIN 1 SENSITIVE Sensitive     GENTAMICIN SYNERGY SENSITIVE Sensitive     * FEW ENTEROCOCCUS FAECALIS      Radiology Studies: No results found.  Scheduled Meds: . artificial tears   Both Eyes QHS  . buPROPion  300 mg Oral Daily  . clonazePAM  0.5 mg Oral BID  . divalproex  1,000 mg Oral Daily  . docusate sodium  100 mg Oral BID  . feeding supplement (ENSURE ENLIVE)  237 mL Oral BID BM  . heparin  5,000 Units Subcutaneous Q8H  . insulin aspart  0-15 Units Subcutaneous TID WC  . insulin aspart  0-5 Units Subcutaneous QHS  . insulin aspart  3 Units Subcutaneous TID WC  . insulin detemir  26 Units Subcutaneous BH-[id number]  . levothyroxine  200 mcg Oral QAC breakfast  . lipase/protease/amylase  24,000 Units Oral TID WC  . meloxicam  7.5 mg Oral Daily  . multivitamin with minerals  1 tablet Oral  Daily  . piperacillin-tazobactam (ZOSYN)  IV  3.375 g Intravenous Q8H  . pregabalin  75 mg Oral TID  . ramipril  5 mg Oral Daily  . simvastatin  40 mg Oral QHS  . tamsulosin  0.4 mg Oral Daily  . vancomycin  1,000 mg Intravenous Q12H   Continuous Infusions: . sodium chloride 50 mL/hr at 11/26/16 2221  . dextrose 5 % and 0.45% NaCl 125 mL/hr at 11/26/16 0939  . lactated ringers 10 mL/hr at 11/26/16 1703     LOS: 4 days   Time spent: 25 minutes.  Hazeline Junker, MD [facility] Pager [phone]  If 7PM-7AM, please contact night-coverage [email or web address] Password [email or web address] 11/29/2016, 4:07 PM

## 2016-11-30 ENCOUNTER — Ambulatory Visit: Payer: Medicare Other | Admitting: Podiatry

## 2016-11-30 DIAGNOSIS — E079 Disorder of thyroid, unspecified: Secondary | ICD-10-CM

## 2016-11-30 DIAGNOSIS — R3911 Hesitancy of micturition: Secondary | ICD-10-CM

## 2016-11-30 DIAGNOSIS — N401 Enlarged prostate with lower urinary tract symptoms: Secondary | ICD-10-CM

## 2016-11-30 DIAGNOSIS — F419 Anxiety disorder, unspecified: Secondary | ICD-10-CM

## 2016-11-30 DIAGNOSIS — S91302A Unspecified open wound, left foot, initial encounter: Secondary | ICD-10-CM

## 2016-11-30 LAB — CULTURE, BLOOD (ROUTINE X 2)
CULTURE: NO GROWTH
Culture: NO GROWTH

## 2016-11-30 LAB — GLUCOSE, CAPILLARY
GLUCOSE-CAPILLARY: 175 mg/dL — AB (ref 65–99)
GLUCOSE-CAPILLARY: 275 mg/dL — AB (ref 65–99)

## 2016-11-30 LAB — AEROBIC/ANAEROBIC CULTURE W GRAM STAIN (SURGICAL/DEEP WOUND)

## 2016-11-30 MED ORDER — HYDROCODONE-ACETAMINOPHEN 5-325 MG PO TABS: 1.0000 | ORAL_TABLET | ORAL | 0 refills | Status: DC | PRN

## 2016-11-30 MED ORDER — POLYETHYLENE GLYCOL 3350 17 G PO PACK: 17.0000 g | PACK | Freq: Every day | ORAL | 0 refills | Status: DC | PRN

## 2016-11-30 MED ORDER — GLUCERNA SHAKE PO LIQD: 237.0000 mL | Freq: Three times a day (TID) | ORAL | 0 refills | Status: DC

## 2016-11-30 MED ORDER — GLUCERNA SHAKE PO LIQD
237.0000 mL | Freq: Three times a day (TID) | ORAL | Status: DC
  Administered 2016-11-30: 237 mL via ORAL

## 2016-11-30 MED ORDER — DOXYCYCLINE HYCLATE 100 MG PO CAPS: 100.0000 mg | ORAL_CAPSULE | Freq: Two times a day (BID) | ORAL | 0 refills | Status: DC

## 2016-11-30 MED ORDER — AMPICILLIN 500 MG PO CAPS: 500.0000 mg | ORAL_CAPSULE | Freq: Three times a day (TID) | ORAL | 0 refills | Status: DC

## 2016-11-30 NOTE — Discharge Summary (Addendum)
Physician Discharge Summary  Ralph [name] NWG:[id number] DOB: 15-Jun-1951 DOA: 11/25/2016  PCP: Michiel Sites, MD  Admit date: 11/25/2016 Discharge date: 11/30/2016  Time spent: 30 minutes  Recommendations for Outpatient Follow-up:  Patient will be discharged to [facility] assisted living with home health PT.   Patient will need to follow up with primary care provider within one week of discharge.   Follow up with Dr. Ranell Patrick, orthopedics, in 1 week.  Follow up with Dr. Edilia Bo, vascular surgeon, in 2 weeks. Patient should continue medications as prescribed.   Patient should follow a heart healthy/carb modified diet.   Discharge Diagnoses:  Diabetic foot wound with radiographic evidence of osteomyelitis, concern for septic arthritis PAD IDT2DM with peripheral neuropathy  Hypothyroidism Hypertension Depression and anxiety BPH (benign prostatic hyperplasia) Constipation  Discharge Condition: Stable  Diet recommendation: heart healthy/carb modified  Filed Weights   11/25/16 2205 11/26/16 0800  Weight: 84.9 kg (187 lb 3.2 oz) 84.8 kg (187 lb)    History of present illness:  On 11/25/16 by Dr. Nilda Calamity is a 66 y.o. male with medical history significant of DM2, BPH, HTN, hypothyroidism (formerly hyperthyroidism, s/p ablation), Depression and anxiety who presents for left foot wound, swelling, erythema and pain.  Ralph [name] reports that a few weeks ago he got new shoes that he thinks were too narrow.  He developed a blister on the lateral aspect of his 5th toe.  He noted the blister, but hoped that it would improve.  He continued to wear the shoes.  He got thick socks, but this did not seem to help.  He is insensate at the toes and distal portion of the foot and had neuropathic pain in the other parts of the foot.  The symptoms started about 2 weeks ago and have been progressing.  Today, a custodian at his SNF noticed increased swelling and erythema and advised  that he get seen.  He has had pain which feels sharp and nerve like in the foot, and this was worse today but improved with hydrocodone given in the ED.  He has had no fever, chills.  He has a normal blood pressure and not other symptoms of sepsis.  He has no chest pain, SOB.  He reports only some mild constipation with no BM for 1 week, but no abdominal pain or swelling.  He does have BPH and expresses some urinary hesitancy  Of note, he was seen on 09/21/16 by his podiatrist and there was no wound noted at that time.   Hospital Course:  Diabetic foot wound with radiographic evidence of osteomyelitis, concern for septic arthritis -s/p left 5th toe ray amputation 3/31. HIV non-reactive -Blood cultures show no growth to date -initially placed on vancomycin and zosyn  -Wound cultures: Growing enterococcus faecalis, sensitive to vancomycin and ampicillin. MRSA -Pathology showed Left 5th toe osteo -Will discharge with Amoxicillin 500mg  TID and Doxycycline 100mg  BID to cover organisms found on wound culture -Patient will need to follow up with Dr. Ranell Patrick, ortho, in 1-2 weeks (signed off on 4/3) -PT rec HH and rolling walker -Continue pain control  PAD -S/p left CIA PTA/stent 11/28/2016 by Dr. Edilia Bo.  -Mild diffuse disease noted in bilateral LE's with 90% focal stenosis of distal left common iliac artery s/p successful balloon and stenting. Preliminary ABI data demonstrated ABIs of 0.8 on the left (affected side) and 0.69 on the right.  -Femoral pulse restored following revascularization.  -Vascular surgery to arrange follow up per Dr.  Dickson's note on 4/3.  -LDL 74 not on statin, on ASA.  IDT2DM with peripheral neuropathy  -Reports good control. HbA1c 7.8%. -Holding metformin, invokana, victoza- resume upon discharge -Continue home dose of levemir, 26 units qAM and SSI. Suspect hyperglycemia from acute infection, added mealtime insulin and monitoring.  -Continue  pregabalin  Hypothyroidism -TSH 2.565 -Continue synthroid  Hypertension -Continue ramipril   Depression and anxiety -Continue home regimen: bupropion, clonazepam, depakote  BPH (benign prostatic hyperplasia) -Continue tamsulosin   Constipation -PRN miralax.   Moderate malnutrition -Nutrition consulted, continue feeding supplementation  Procedures:  Dr. Edilia Bo 11/28/2016: - Ultrasound-guided access to the right common femoral artery - Aortogram with bilateral iliac arteriogram and bilateral lower extremity runoff - Selective catheterization of the left external iliac artery with angioplasty and stenting of a left common femoral artery stenosis using a 6 mm x 19 balloon expandable stent. FINDINGS: 1. There are single renal arteries bilaterally with no significant renal artery stenosis is then identified. The infrarenal aorta is widely patent. 2. On the left side, which is the site of concern, there is some mild diffuse disease of the left common iliac artery but no focal stenosis. The distal left common iliac artery had a 90% focal stenosis which was successfully ballooned and stented as described above. The hypogastric artery on the left was occluded. The external iliac artery was patent. There was moderate plaque in the common femoral artery on the left and also some mild disease in the proximal deep femoral artery. The superficial femoral artery was patent with some mild disease at the adductor canal. The below-knee popliteal artery was patent. There is two-vessel runoff on the left via the posterior tibial and peroneal arteries. The left anterior tibial artery was occluded. 3. On the right side, the common iliac arteries patent with mild disease. The external iliac artery and hypogastric arteries are patent. The common femoral artery has moderate plaque. The deep femoral artery on the right is patent. The superficial femoral artery on the right is occluded at the origin with  reconstitution of the popliteal artery which has moderate disease. There is two-vessel runoff on the right via the posterior tibial and peroneal arteries.   PRE: 90% POST: 0% STENT: yes  Consultations:  Orthopedics, Dr. Ranell Patrick  Vascular Surgery, Dr. Edilia Bo  Discharge Exam: Vitals:   11/29/16 2014 11/30/16 0450  BP: (!) 154/80 (!) 146/62  Pulse: 95 83  Resp: 18 18  Temp: 97.6 F (36.4 C) 97.7 F (36.5 C)   Patient complains of pain in the left foot.  Denies chest pain, shortness of breath, abdominal pain.    General: Well developed, well nourished, NAD, appears stated age  HEENT: NCAT, mucous membranes moist.  Cardiovascular: S1 S2 auscultated, no murmurs, RRR  Respiratory: Clear to auscultation bilaterally with equal chest rise  Abdomen: Soft, nontender, nondistended, + bowel sounds  Extremities: warm dry without cyanosis clubbing. LLE edema, dressing in place- clean.   Neuro: AAOx3, nonfocal   Psych: Appropriate mood and affect, pleasant  Discharge Instructions Discharge Instructions    Discharge instructions    Complete by:  As directed    Patient will be discharged to [facility] assisted living with home health PT.   Patient will need to follow up with primary care provider within one week of discharge.   Follow up with Dr. Ranell Patrick, orthopedics, in 1 week.  Follow up with Dr. Edilia Bo, vascular surgeon, in 2 weeks. Patient should continue medications as prescribed.   Patient should follow  a heart healthy/carb modified diet.     Current Discharge Medication List    START taking these medications   Details  ampicillin (PRINCIPEN) 500 MG capsule Take 1 capsule (500 mg total) by mouth 3 (three) times daily. Qty: 15 capsule, Refills: 0    doxycycline (VIBRAMYCIN) 100 MG capsule Take 1 capsule (100 mg total) by mouth 2 (two) times daily. Qty: 10 capsule, Refills: 0    feeding supplement, GLUCERNA SHAKE, (GLUCERNA SHAKE) LIQD Take 237 mLs by mouth 3  (three) times daily between meals. Qty: 90 Can, Refills: 0    HYDROcodone-acetaminophen (NORCO/VICODIN) 5-325 MG tablet Take 1 tablet by mouth every 4 (four) hours as needed for moderate pain. Qty: 30 tablet, Refills: 0    polyethylene glycol (MIRALAX / GLYCOLAX) packet Take 17 g by mouth daily as needed for mild constipation. Qty: 14 each, Refills: 0      CONTINUE these medications which have NOT CHANGED   Details  acetaminophen (TYLENOL) 325 MG tablet Take 650 mg by mouth every 6 (six) hours as needed for moderate pain.     aspirin EC 81 MG tablet Take 81 mg by mouth daily.    buPROPion (WELLBUTRIN XL) 300 MG 24 hr tablet Take 300 mg by mouth daily.    canagliflozin (INVOKANA) 300 MG TABS tablet Take 300 mg by mouth daily before breakfast.    clonazePAM (KLONOPIN) 0.5 MG tablet Take 0.5 mg by mouth 2 (two) times daily.    divalproex (DEPAKOTE ER) 500 MG 24 hr tablet Take 1,000 mg by mouth every morning.    insulin detemir (LEVEMIR) 100 UNIT/ML injection Inject 26 Units into the skin every morning.    Lactobacillus Casei-Folic Acid (RESTORA RX) 60-1.25 MG CAPS Take 1 capsule by mouth daily.    levocetirizine (XYZAL) 5 MG tablet Take 5 mg by mouth every morning.    levothyroxine (SYNTHROID, LEVOTHROID) 200 MCG tablet Take 200 mcg by mouth daily before breakfast.    liraglutide (VICTOZA) 18 MG/3ML SOPN Inject 1.8 mg into the skin every morning.    meloxicam (MOBIC) 7.5 MG tablet Take 7.5 mg by mouth daily.    metFORMIN (GLUCOPHAGE-XR) 500 MG 24 hr tablet Take 1,000 mg by mouth 2 (two) times daily.    Multiple Vitamin (MULTIVITAMIN WITH MINERALS) TABS tablet Take 1 tablet by mouth daily.    Pancrelipase, Lip-Prot-Amyl, (CREON) 24000-76000 units CPEP Take 3 capsules by mouth 3 (three) times daily with meals. Along with 120000 units of amylase-included in capsules    Polyethyl Glycol-Propyl Glycol (SYSTANE) 0.4-0.3 % SOLN Apply 1 drop to eye 4 (four) times daily.    pregabalin  (LYRICA) 75 MG capsule Take 75 mg by mouth 3 (three) times daily.    ramipril (ALTACE) 5 MG capsule Take 5 mg by mouth daily.    simvastatin (ZOCOR) 40 MG tablet Take 40 mg by mouth at bedtime.    tamsulosin (FLOMAX) 0.4 MG CAPS capsule Take 0.4 mg by mouth daily after breakfast.    White Petrolatum-Mineral Oil (SYSTANE NIGHTTIME) OINT Place 1 application into both eyes at bedtime.       Allergies  Allergen Reactions  . Antihistamines, Chlorpheniramine-Type Other (See Comments)    Per MAR  . Antihistamines, Diphenhydramine-Type Other (See Comments)    Per MAR  . Antihistamines, Loratadine-Type Other (See Comments)    Per MAR  . Benadryl [Diphenhydramine Hcl] Other (See Comments)  . Ethanolamine Other (See Comments)    Per MAR   Follow-up Information  NORRIS,STEVEN R, MD. Schedule an appointment as soon as possible for a visit in 1 week(s).   Specialty:  Orthopedic Surgery Why:  [phone] Contact information: [address] [place] [address] [phone]        Michiel Sites, MD Follow up.   Specialty:  Endocrinology Contact information: [address] [place] [address] [phone]        [name], MD Follow up.   Specialties:  Vascular Surgery, Cardiology Why:  you will be contacted to schedule a follow-up appointment. Contact information: [address] [place] [address] [phone]            The results of significant diagnostics from this hospitalization (including imaging, microbiology, ancillary and laboratory) are listed below for reference.    Significant Diagnostic Studies: Dg Foot Complete Left  Result Date: 11/25/2016 CLINICAL DATA:  Ulcer on lateral side of LEFT foot near little toe excreting pus question osteomyelitis EXAM: LEFT FOOT - COMPLETE 3+ VIEW COMPARISON:  11/22/2016 FINDINGS: Osseous demineralization. Bone destruction at the head of the fifth metatarsal consistent  with osteomyelitis. Additional bone destruction at the lateral base of the proximal phalanx of the fifth toe compatible with osteomyelitis and involvement of the fifth MTP joint compatible with septic arthritis. Overlying soft tissue swelling. No additional fracture, dislocation or bone destruction. Bone destruction at the fifth metatarsal head is progressive since the previous exam, as is the destruction at the base of the proximal phalanx little toe. Tiny plantar calcaneal spur. IMPRESSION: Septic arthritis of the fifth MTP joint with osteomyelitis of the base of the proximal phalanx little toe and more prominently of the distal LEFT fifth metatarsal. Bone destruction is mildly progressive since 11/22/2016. Electronically Signed   By: Ulyses Southward M.D.   On: 11/25/2016 18:00    Microbiology: Recent Results (from the past 240 hour(s))  Blood culture (routine x 2)     Status: None   Collection Time: 11/25/16  5:30 PM  Result Value Ref Range Status   Specimen Description BLOOD RIGHT WRIST  Final   Special Requests IN PEDIATRIC BOTTLE 3CC  Final   Culture NO GROWTH 5 DAYS  Final   Report Status 11/30/2016 FINAL  Final  Blood culture (routine x 2)     Status: None   Collection Time: 11/25/16  5:35 PM  Result Value Ref Range Status   Specimen Description BLOOD LEFT ANTECUBITAL  Final   Special Requests BOTTLES DRAWN AEROBIC AND ANAEROBIC 5CC  Final   Culture NO GROWTH 5 DAYS  Final   Report Status 11/30/2016 FINAL  Final  Aerobic Culture (superficial specimen)     Status: None   Collection Time: 11/26/16  3:14 AM  Result Value Ref Range Status   Specimen Description WOUND  Final   Special Requests LEFT FOOT  Final   Gram Stain   Final    NO WBC SEEN RARE GRAM POSITIVE COCCI IN PAIRS RARE SQUAMOUS EPITHELIAL CELLS PRESENT    Culture FEW METHICILLIN RESISTANT STAPHYLOCOCCUS AUREUS  Final   Report Status 11/28/2016 FINAL  Final   Organism ID, Bacteria METHICILLIN RESISTANT STAPHYLOCOCCUS  AUREUS  Final      Susceptibility   Methicillin resistant staphylococcus aureus - MIC*    CIPROFLOXACIN >=8 RESISTANT Resistant     ERYTHROMYCIN >=8 RESISTANT Resistant     GENTAMICIN <=0.5 SENSITIVE Sensitive     OXACILLIN >=4 RESISTANT Resistant     TETRACYCLINE <=1 SENSITIVE Sensitive     VANCOMYCIN  1 SENSITIVE Sensitive     TRIMETH/SULFA >=320 RESISTANT Resistant     CLINDAMYCIN <=0.25 SENSITIVE Sensitive     RIFAMPIN <=0.5 SENSITIVE Sensitive     Inducible Clindamycin NEGATIVE Sensitive     * FEW METHICILLIN RESISTANT STAPHYLOCOCCUS AUREUS  Surgical pcr screen     Status: None   Collection Time: 11/26/16 11:30 AM  Result Value Ref Range Status   MRSA, PCR NEGATIVE NEGATIVE Final   Staphylococcus aureus NEGATIVE NEGATIVE Final    Comment:        The Xpert SA Assay (FDA approved for NASAL specimens in patients over 15 years of age), is one component of a comprehensive surveillance program.  Test performance has been validated by [facility] for patients greater than or equal to 53 year old. It is not intended to diagnose infection nor to guide or monitor treatment.   Aerobic/Anaerobic Culture (surgical/deep wound)     Status: None   Collection Time: 11/26/16  7:20 PM  Result Value Ref Range Status   Specimen Description WOUND  Final   Special Requests LEFT TOE  Final   Gram Stain   Final    ABUNDANT WBC PRESENT, PREDOMINANTLY PMN RARE GRAM POSITIVE COCCI IN PAIRS    Culture FEW ENTEROCOCCUS FAECALIS NO ANAEROBES ISOLATED   Final   Report Status 11/30/2016 FINAL  Final   Organism ID, Bacteria ENTEROCOCCUS FAECALIS  Final      Susceptibility   Enterococcus faecalis - MIC*    AMPICILLIN <=2 SENSITIVE Sensitive     VANCOMYCIN 1 SENSITIVE Sensitive     GENTAMICIN SYNERGY SENSITIVE Sensitive     * FEW ENTEROCOCCUS FAECALIS     Labs: Basic Metabolic Panel:  Recent Labs Lab 11/25/16 1410 11/25/16 2212 11/28/16 0600 11/28/16 1400 11/29/16 0304  NA 136  --   137  --  137  K 4.0  --  4.1  --  4.2  CL 101  --  104  --  100*  CO2 28  --  23  --  26  GLUCOSE 237*  --  124*  --  298*  BUN 11  --  13  --  16  CREATININE 0.91 0.74 0.83 0.80 0.97  CALCIUM 8.5*  --  8.8*  --  8.7*   Liver Function Tests:  Recent Labs Lab 11/25/16 1410  AST 15  ALT 15*  ALKPHOS 59  BILITOT 0.6  PROT 6.5  ALBUMIN 2.8*   No results for input(s): LIPASE, AMYLASE in the last 168 hours. No results for input(s): AMMONIA in the last 168 hours. CBC:  Recent Labs Lab 11/25/16 1410 11/25/16 2212 11/28/16 0600 11/28/16 1400 11/29/16 0304  WBC 7.2 7.7 7.9 7.3 6.9  NEUTROABS 4.6  --   --   --   --   HGB 10.9* 10.8* 11.1* 11.5* 10.3*  HCT 33.7* 33.4* 34.2* 35.5* 32.5*  MCV 89.9 89.8 91.9 90.8 90.5  PLT 376 375 362 408* 384   Cardiac Enzymes: No results for input(s): CKTOTAL, CKMB, CKMBINDEX, TROPONINI in the last 168 hours. BNP: BNP (last 3 results) No results for input(s): BNP in the last 8760 hours.  ProBNP (last 3 results) No results for input(s): PROBNP in the last 8760 hours.  CBG:  Recent Labs Lab 11/29/16 0611 11/29/16 1116 11/29/16 1625 11/29/16 2112 11/30/16 0628  GLUCAP 199* 251* 322* 149* 175*       Signed:  Edsel Petrin  [facility] 11/30/2016, 11:18 AM

## 2016-11-30 NOTE — Consult Note (Signed)
[facility] CM Primary Care Navigator  11/30/2016  Coben [facility] 1951/04/13 [id number]   Met with patientat the bedsideto identify possible discharge needs. Patient states he had an infection to his left 5th toe (with swelling, redness and pain) that had led to this admission/ surgery. Patientreports he had been residing at [facility] facility for 2 years where his care needs had been provided by facility staff including dispensing/ administration of his medications and transportation to his doctors' appointments.  Patient endorsed  Dr. Anda Kraft with [facility] as his primary care provider and who helps manage his diabetes.  Plan for discharge is to return back to the facility [facility]) when medically stable.    Patientvoiced understanding to call primary care provider's officewhen he returns home [facility] facility),for a post discharge follow-up appointment within a week or sooner if needs arise.Patient letter (with PCP's contact number) wasprovided as a reminder.  Patient denies any other health management needs or concerns at this time.  [facility] care management contact information provided for any future needs that he may have.   For questions, please contact:  Dannielle Huh, BSN, RN- [facility] Primary Care Navigator  Telephone: [phone] [place]

## 2016-11-30 NOTE — Progress Notes (Signed)
Attempted report to [facility] in [place]. No answer. Pt is from this facility and is returning back at discharge.   Berdine Dance BSN, RN

## 2016-11-30 NOTE — Progress Notes (Signed)
Nutrition Follow Up  DOCUMENTATION CODES:   Non-severe (moderate) malnutrition in context of chronic illness  INTERVENTION:    Glucerna Shake po TID, each supplement provides 220 kcal and 10 grams of protein  NUTRITION DIAGNOSIS:   Malnutrition (Moderate) related to chronic illness as evidenced by mild depletion of body fat, moderate depletion of body fat, mild depletion of muscle mass, moderate depletions of muscle mass, ongoing  GOAL:   Patient will meet greater than or equal to 90% of their needs, met  MONITOR:   PO intake, Supplement acceptance, Labs, Weight trends  ASSESSMENT:    66 yo Male admitted with diabetic foot wound-left lateral foot, osteomyelitis, concern for septic arthritis; pt with hx of DM without complication, anxiety/depression, HTN. Per chart review, pt with hx of chronic schizophrenia, paranoid type.  RD spoke with pt at bedside. Reports a good appetite.  PO intake 100% per flowsheets. Receiving Ensure Enlive supplements.  Will change to Glucerna Shakes due to elevated blood sugars. Medications reviewed and include ABX, Creon and Reglan. CBG's [phone]  Diet Order:  Diet heart healthy/carb modified Room service appropriate? Yes; Fluid consistency: Thin  Skin:  Wound (see comment) (diabetic foot ulcer, osteomyelitis)  Last BM:  3/23  Height:   Ht Readings from Last 1 Encounters:  11/28/16 6' (1.829 m)   Weight:   Wt Readings from Last 1 Encounters:  11/26/16 187 lb (84.8 kg)    BMI:  Body mass index is 25.36 kg/m.  Estimated Nutritional Needs:   Kcal:  2100-2400 kcals  Protein:  105-128 g  Fluid:  >/= 2 L  EDUCATION NEEDS:   No education needs identified at this time  Arthur Holms, RD, LDN Pager #: [phone] After-Hours Pager #: [phone]

## 2016-11-30 NOTE — Care Management Note (Signed)
Case Management Note Donn Pierini RN, BSN Unit 2W-Case Manager [phone]  Patient Details  Name: Ralph [name] MRN: [id number] Date of Birth: November 16, 1950  Subjective/Objective:  Pt admitted with open wound of foot concern for osteomyelitis and cellulitis s/p OR on 4/2 for  Left CIA PTA/Stent                 Action/Plan: PTA pt was at [facility] facility)- CSW consulted for return to SNF when medically stable. No CM needs noted at this time with plan to return to SNF  Expected Discharge Date:  11/30/16               Expected Discharge Plan:  Assisted Living / Rest Home  In-House Referral:  Clinical Social Work  Discharge planning Services  CM Consult  Post Acute Care Choice:  Home Health, Durable Medical Equipment Choice offered to:  NA  DME Arranged:    DME Agency:     HH Arranged:  PT HH Agency:  [facility]  Status of Service:  Completed, signed off  If discussed at Long Length of Stay Meetings, dates discussed:   Discharge Disposition: ALF with home health  Additional Comments:  11/30/16- 1130- Donn Pierini RN, CM- pt for d/c today - plan to return to ALF- [facility]- CSW following for d/c needs- order placed for HHPT- per CSW facility has in house therapy with [facility]- order will be placed on FL2  [employer], RN 11/30/2016, 11:30 AM

## 2016-11-30 NOTE — Progress Notes (Signed)
Clinical Social Worker facilitated patient discharge including contacting patient family and facility to confirm patient discharge plans.  Clinical information faxed to facility and family agreeable with plan.  CSW arranged ambulance transport via [facility] to [facility] of [place] .  RN Lauren to call [phone]  report prior to discharge.  Clinical Social Worker will sign off for now as social work intervention is no longer needed. Please consult [place] again if new need arises.  Ralph [name], MSW, [employer] [phone]

## 2016-11-30 NOTE — Progress Notes (Signed)
Pt has been discharged to [facility]. IV and telemetry box removed. Pt left the unit with all of his belongings. Pt was discharged via [facility].   Berdine Dance BSN, RN

## 2016-11-30 NOTE — [place] FL2 (Signed)
[place] MEDICAID FL2 LEVEL OF CARE SCREENING TOOL     IDENTIFICATION  Patient Name: Ralph [name] Birthdate: June 02, 1951 Sex: male Admission Date (Current Location): 11/25/2016  [facility] and [place] Number:  Producer, television/film/video and Address:  [facility], [address], [place], [place] [address]      Provider Number: [id number]  Attending Physician Name and Address:  Edsel Petrin, DO  Relative Name and Phone Number:       Current Level of Care: Hospital Recommended Level of Care: Assisted Living Facility Prior Approval Number:    Date Approved/Denied:   PASRR Number:    Discharge Plan: Other (Comment) (ALF)    Current Diagnoses: Patient Active Problem List   Diagnosis Date Noted  . Malnutrition of moderate degree 11/26/2016  . Septic arthritis of foot (HCC) 11/26/2016  . Open wound of left foot 11/25/2016  . Thyroid disease   . Hypertension   . Diabetes mellitus without complication (HCC)   . Depression   . BPH (benign prostatic hyperplasia)   . Anxiety     Orientation RESPIRATION BLADDER Height & Weight     Self, Time, Situation, Place  Normal Continent Weight: 187 lb (84.8 kg) Height:  6' (182.9 cm)  BEHAVIORAL SYMPTOMS/MOOD NEUROLOGICAL BOWEL NUTRITION STATUS      Continent Diet (Heart healthy)  AMBULATORY STATUS COMMUNICATION OF NEEDS Skin   Limited Assist Verbally Normal                       Personal Care Assistance Level of Assistance  Bathing, Feeding, Dressing Bathing Assistance: Limited assistance Feeding assistance: Independent Dressing Assistance: Limited assistance     Functional Limitations Info  Sight, Hearing, Speech Sight Info: Adequate Hearing Info: Adequate Speech Info: Impaired    SPECIAL CARE FACTORS FREQUENCY  PT (By licensed PT), OT (By licensed OT)     PT Frequency: 3x week OT Frequency: 3x week            Contractures Contractures Info: Not present    Additional Factors Info   Code Status, Allergies Code Status Info: Full Code Allergies Info: ANTIHISTAMINES, CHLORPHENIRAMINE-TYPE, ANTIHISTAMINES, DIPHENHYDRAMINE-TYPE, ANTIHISTAMINES, LORATADINE-TYPE, BENADRYL DIPHENHYDRAMINE HCL, ETHANOLAMINE           Current Medications (11/30/2016):  This is the current hospital active medication list Current Facility-Administered Medications  Medication Dose Route Frequency Provider Last Rate Last Dose  . 0.9 %  sodium chloride infusion   Intravenous Continuous Beverely Low, MD 50 mL/hr at 11/29/16 2128    . acetaminophen (TYLENOL) tablet 650 mg  650 mg Oral Q6H PRN Beverely Low, MD   650 mg at 11/30/16 0121   Or  . acetaminophen (TYLENOL) suppository 650 mg  650 mg Rectal Q6H PRN Beverely Low, MD      . artificial tears (LACRILUBE) ophthalmic ointment   Both Eyes QHS Inez Catalina, MD      . buPROPion (WELLBUTRIN XL) 24 hr tablet 300 mg  300 mg Oral Daily Inez Catalina, MD   300 mg at 11/29/16 1053  . clonazePAM (KLONOPIN) tablet 0.5 mg  0.5 mg Oral BID Inez Catalina, MD   0.5 mg at 11/29/16 2121  . dextrose 5 %-0.45 % sodium chloride infusion   Intravenous Continuous Tyrone Nine, MD 125 mL/hr at 11/26/16 [phone]    . divalproex (DEPAKOTE ER) 24 hr tablet 1,000 mg  1,000 mg Oral Daily Inez Catalina, MD   1,000 mg at  11/29/16 1052  . docusate sodium (COLACE) capsule 100 mg  100 mg Oral BID Beverely Low, MD   100 mg at 11/29/16 2120  . feeding supplement (ENSURE ENLIVE) (ENSURE ENLIVE) liquid 237 mL  237 mL Oral BID BM Tyrone Nine, MD   237 mL at 11/29/16 1416  . heparin injection 5,000 Units  5,000 Units Subcutaneous Q8H Inez Catalina, MD   5,000 Units at 11/30/16 0544  . hydrALAZINE (APRESOLINE) tablet 10 mg  10 mg Oral Q8H PRN Inez Catalina, MD      . HYDROcodone-acetaminophen (NORCO/VICODIN) 5-325 MG per tablet 1-2 tablet  1-2 tablet Oral Q4H PRN Inez Catalina, MD   2 tablet at 11/28/16 1820  . insulin aspart (novoLOG) injection 0-15 Units  0-15 Units Subcutaneous TID  WC Inez Catalina, MD   3 Units at 11/30/16 [phone]  . insulin aspart (novoLOG) injection 0-5 Units  0-5 Units Subcutaneous QHS Inez Catalina, MD   3 Units at 11/27/16 2255  . insulin aspart (novoLOG) injection 3 Units  3 Units Subcutaneous TID WC Tyrone Nine, MD   3 Units at 11/29/16 1646  . insulin detemir (LEVEMIR) injection 26 Units  26 Units Subcutaneous Lesle Reek Inez Catalina, MD   26 Units at 11/30/16 [phone]  . lactated ringers infusion   Intravenous Continuous Kipp Brood, MD 10 mL/hr at 11/26/16 1703    . levothyroxine (SYNTHROID, LEVOTHROID) tablet 200 mcg  200 mcg Oral QAC breakfast Inez Catalina, MD   200 mcg at 11/30/16 0800  . lipase/protease/amylase (CREON) capsule 24,000 Units  24,000 Units Oral TID WC Inez Catalina, MD   24,000 Units at 11/30/16 [phone]  . meloxicam (MOBIC) tablet 7.5 mg  7.5 mg Oral Daily Inez Catalina, MD   7.5 mg at 11/29/16 1054  . metoCLOPramide (REGLAN) tablet 5-10 mg  5-10 mg Oral Q8H PRN Beverely Low, MD       Or  . metoCLOPramide (REGLAN) injection 5-10 mg  5-10 mg Intravenous Q8H PRN Beverely Low, MD      . morphine 2 MG/ML injection 2 mg  2 mg Intravenous Q2H PRN Beverely Low, MD      . multivitamin with minerals tablet 1 tablet  1 tablet Oral Daily Inez Catalina, MD   1 tablet at 11/29/16 1053  . ondansetron (ZOFRAN) tablet 4 mg  4 mg Oral Q6H PRN Beverely Low, MD       Or  . ondansetron [facility]) injection 4 mg  4 mg Intravenous Q6H PRN Beverely Low, MD      . oxyCODONE (Oxy IR/ROXICODONE) immediate release tablet 5-10 mg  5-10 mg Oral Q3H PRN Beverely Low, MD      . polyethylene glycol (MIRALAX / GLYCOLAX) packet 17 g  17 g Oral Daily PRN Inez Catalina, MD      . pregabalin (LYRICA) capsule 75 mg  75 mg Oral TID Inez Catalina, MD   75 mg at 11/29/16 2120  . ramipril (ALTACE) capsule 5 mg  5 mg Oral Daily Inez Catalina, MD   5 mg at 11/29/16 1053  . simvastatin (ZOCOR) tablet 40 mg  40 mg Oral QHS Inez Catalina, MD   40 mg at 11/29/16 2120  .  tamsulosin (FLOMAX) capsule 0.4 mg  0.4 mg Oral Daily Inez Catalina, MD   0.4 mg at 11/29/16 1053  . vancomycin (VANCOCIN) IVPB 1000 mg/200 mL premix  1,000 mg Intravenous Q12H Irving Burton B  Criselda Peaches, MD   1,000 mg at 11/30/16 0117     Discharge Medications: Please see discharge summary for a list of discharge medications.  Relevant Imaging Results:  Relevant Lab Results:   Additional Information [id number]  Althea Charon, LCSW

## 2016-12-03 DIAGNOSIS — L97422 Non-pressure chronic ulcer of left heel and midfoot with fat layer exposed: Secondary | ICD-10-CM | POA: Diagnosis not present

## 2016-12-03 DIAGNOSIS — R2689 Other abnormalities of gait and mobility: Secondary | ICD-10-CM | POA: Diagnosis not present

## 2016-12-03 DIAGNOSIS — K5900 Constipation, unspecified: Secondary | ICD-10-CM | POA: Diagnosis not present

## 2016-12-03 DIAGNOSIS — M6281 Muscle weakness (generalized): Secondary | ICD-10-CM | POA: Diagnosis not present

## 2016-12-03 DIAGNOSIS — I10 Essential (primary) hypertension: Secondary | ICD-10-CM | POA: Diagnosis not present

## 2016-12-03 DIAGNOSIS — E11621 Type 2 diabetes mellitus with foot ulcer: Secondary | ICD-10-CM | POA: Diagnosis not present

## 2016-12-08 ENCOUNTER — Encounter: Payer: Self-pay | Admitting: Internal Medicine

## 2016-12-08 ENCOUNTER — Other Ambulatory Visit: Payer: Self-pay

## 2016-12-08 DIAGNOSIS — Z9289 Personal history of other medical treatment: Secondary | ICD-10-CM

## 2016-12-08 DIAGNOSIS — I739 Peripheral vascular disease, unspecified: Secondary | ICD-10-CM

## 2016-12-08 DIAGNOSIS — M868X8 Other osteomyelitis, other site: Secondary | ICD-10-CM | POA: Diagnosis not present

## 2016-12-08 DIAGNOSIS — Z4789 Encounter for other orthopedic aftercare: Secondary | ICD-10-CM | POA: Diagnosis not present

## 2016-12-11 DIAGNOSIS — L97422 Non-pressure chronic ulcer of left heel and midfoot with fat layer exposed: Secondary | ICD-10-CM | POA: Diagnosis not present

## 2016-12-11 DIAGNOSIS — I10 Essential (primary) hypertension: Secondary | ICD-10-CM | POA: Diagnosis not present

## 2016-12-11 DIAGNOSIS — E11621 Type 2 diabetes mellitus with foot ulcer: Secondary | ICD-10-CM | POA: Diagnosis not present

## 2016-12-11 DIAGNOSIS — R2689 Other abnormalities of gait and mobility: Secondary | ICD-10-CM | POA: Diagnosis not present

## 2016-12-11 DIAGNOSIS — M6281 Muscle weakness (generalized): Secondary | ICD-10-CM | POA: Diagnosis not present

## 2016-12-11 DIAGNOSIS — K5900 Constipation, unspecified: Secondary | ICD-10-CM | POA: Diagnosis not present

## 2016-12-12 DIAGNOSIS — L97422 Non-pressure chronic ulcer of left heel and midfoot with fat layer exposed: Secondary | ICD-10-CM | POA: Diagnosis not present

## 2016-12-12 DIAGNOSIS — R2689 Other abnormalities of gait and mobility: Secondary | ICD-10-CM | POA: Diagnosis not present

## 2016-12-12 DIAGNOSIS — M6281 Muscle weakness (generalized): Secondary | ICD-10-CM | POA: Diagnosis not present

## 2016-12-12 DIAGNOSIS — K5900 Constipation, unspecified: Secondary | ICD-10-CM | POA: Diagnosis not present

## 2016-12-12 DIAGNOSIS — I10 Essential (primary) hypertension: Secondary | ICD-10-CM | POA: Diagnosis not present

## 2016-12-12 DIAGNOSIS — E11621 Type 2 diabetes mellitus with foot ulcer: Secondary | ICD-10-CM | POA: Diagnosis not present

## 2016-12-14 DIAGNOSIS — E032 Hypothyroidism due to medicaments and other exogenous substances: Secondary | ICD-10-CM | POA: Diagnosis not present

## 2016-12-14 DIAGNOSIS — I739 Peripheral vascular disease, unspecified: Secondary | ICD-10-CM | POA: Diagnosis not present

## 2016-12-14 DIAGNOSIS — E118 Type 2 diabetes mellitus with unspecified complications: Secondary | ICD-10-CM | POA: Diagnosis not present

## 2016-12-15 DIAGNOSIS — M868X8 Other osteomyelitis, other site: Secondary | ICD-10-CM | POA: Diagnosis not present

## 2016-12-15 DIAGNOSIS — Z4789 Encounter for other orthopedic aftercare: Secondary | ICD-10-CM | POA: Diagnosis not present

## 2016-12-16 DIAGNOSIS — R2689 Other abnormalities of gait and mobility: Secondary | ICD-10-CM | POA: Diagnosis not present

## 2016-12-16 DIAGNOSIS — I10 Essential (primary) hypertension: Secondary | ICD-10-CM | POA: Diagnosis not present

## 2016-12-16 DIAGNOSIS — E11621 Type 2 diabetes mellitus with foot ulcer: Secondary | ICD-10-CM | POA: Diagnosis not present

## 2016-12-16 DIAGNOSIS — M6281 Muscle weakness (generalized): Secondary | ICD-10-CM | POA: Diagnosis not present

## 2016-12-16 DIAGNOSIS — L97422 Non-pressure chronic ulcer of left heel and midfoot with fat layer exposed: Secondary | ICD-10-CM | POA: Diagnosis not present

## 2016-12-16 DIAGNOSIS — K5900 Constipation, unspecified: Secondary | ICD-10-CM | POA: Diagnosis not present

## 2016-12-19 DIAGNOSIS — L97422 Non-pressure chronic ulcer of left heel and midfoot with fat layer exposed: Secondary | ICD-10-CM | POA: Diagnosis not present

## 2016-12-19 DIAGNOSIS — M6281 Muscle weakness (generalized): Secondary | ICD-10-CM | POA: Diagnosis not present

## 2016-12-19 DIAGNOSIS — I10 Essential (primary) hypertension: Secondary | ICD-10-CM | POA: Diagnosis not present

## 2016-12-19 DIAGNOSIS — E11621 Type 2 diabetes mellitus with foot ulcer: Secondary | ICD-10-CM | POA: Diagnosis not present

## 2016-12-19 DIAGNOSIS — K5900 Constipation, unspecified: Secondary | ICD-10-CM | POA: Diagnosis not present

## 2016-12-19 DIAGNOSIS — R2689 Other abnormalities of gait and mobility: Secondary | ICD-10-CM | POA: Diagnosis not present

## 2016-12-28 DIAGNOSIS — G609 Hereditary and idiopathic neuropathy, unspecified: Secondary | ICD-10-CM | POA: Diagnosis not present

## 2016-12-28 DIAGNOSIS — R600 Localized edema: Secondary | ICD-10-CM | POA: Diagnosis not present

## 2016-12-28 DIAGNOSIS — L97422 Non-pressure chronic ulcer of left heel and midfoot with fat layer exposed: Secondary | ICD-10-CM | POA: Diagnosis not present

## 2016-12-28 DIAGNOSIS — I739 Peripheral vascular disease, unspecified: Secondary | ICD-10-CM | POA: Diagnosis not present

## 2016-12-28 DIAGNOSIS — K5900 Constipation, unspecified: Secondary | ICD-10-CM | POA: Diagnosis not present

## 2016-12-28 DIAGNOSIS — M6281 Muscle weakness (generalized): Secondary | ICD-10-CM | POA: Diagnosis not present

## 2016-12-28 DIAGNOSIS — E11621 Type 2 diabetes mellitus with foot ulcer: Secondary | ICD-10-CM | POA: Diagnosis not present

## 2016-12-28 DIAGNOSIS — I10 Essential (primary) hypertension: Secondary | ICD-10-CM | POA: Diagnosis not present

## 2016-12-28 DIAGNOSIS — L853 Xerosis cutis: Secondary | ICD-10-CM | POA: Diagnosis not present

## 2016-12-28 DIAGNOSIS — E119 Type 2 diabetes mellitus without complications: Secondary | ICD-10-CM | POA: Diagnosis not present

## 2016-12-28 DIAGNOSIS — R2689 Other abnormalities of gait and mobility: Secondary | ICD-10-CM | POA: Diagnosis not present

## 2017-01-02 DIAGNOSIS — L97422 Non-pressure chronic ulcer of left heel and midfoot with fat layer exposed: Secondary | ICD-10-CM | POA: Diagnosis not present

## 2017-01-02 DIAGNOSIS — E11621 Type 2 diabetes mellitus with foot ulcer: Secondary | ICD-10-CM | POA: Diagnosis not present

## 2017-01-02 DIAGNOSIS — R2689 Other abnormalities of gait and mobility: Secondary | ICD-10-CM | POA: Diagnosis not present

## 2017-01-02 DIAGNOSIS — K5900 Constipation, unspecified: Secondary | ICD-10-CM | POA: Diagnosis not present

## 2017-01-02 DIAGNOSIS — I10 Essential (primary) hypertension: Secondary | ICD-10-CM | POA: Diagnosis not present

## 2017-01-02 DIAGNOSIS — M6281 Muscle weakness (generalized): Secondary | ICD-10-CM | POA: Diagnosis not present

## 2017-01-04 DIAGNOSIS — M6281 Muscle weakness (generalized): Secondary | ICD-10-CM | POA: Diagnosis not present

## 2017-01-04 DIAGNOSIS — K5900 Constipation, unspecified: Secondary | ICD-10-CM | POA: Diagnosis not present

## 2017-01-04 DIAGNOSIS — I10 Essential (primary) hypertension: Secondary | ICD-10-CM | POA: Diagnosis not present

## 2017-01-04 DIAGNOSIS — L97422 Non-pressure chronic ulcer of left heel and midfoot with fat layer exposed: Secondary | ICD-10-CM | POA: Diagnosis not present

## 2017-01-04 DIAGNOSIS — R2689 Other abnormalities of gait and mobility: Secondary | ICD-10-CM | POA: Diagnosis not present

## 2017-01-04 DIAGNOSIS — E11621 Type 2 diabetes mellitus with foot ulcer: Secondary | ICD-10-CM | POA: Diagnosis not present

## 2017-01-10 DIAGNOSIS — I10 Essential (primary) hypertension: Secondary | ICD-10-CM | POA: Diagnosis not present

## 2017-01-10 DIAGNOSIS — L97422 Non-pressure chronic ulcer of left heel and midfoot with fat layer exposed: Secondary | ICD-10-CM | POA: Diagnosis not present

## 2017-01-10 DIAGNOSIS — M6281 Muscle weakness (generalized): Secondary | ICD-10-CM | POA: Diagnosis not present

## 2017-01-10 DIAGNOSIS — R2689 Other abnormalities of gait and mobility: Secondary | ICD-10-CM | POA: Diagnosis not present

## 2017-01-10 DIAGNOSIS — E11621 Type 2 diabetes mellitus with foot ulcer: Secondary | ICD-10-CM | POA: Diagnosis not present

## 2017-01-10 DIAGNOSIS — K5900 Constipation, unspecified: Secondary | ICD-10-CM | POA: Diagnosis not present

## 2017-01-11 DIAGNOSIS — M6281 Muscle weakness (generalized): Secondary | ICD-10-CM | POA: Diagnosis not present

## 2017-01-11 DIAGNOSIS — K5900 Constipation, unspecified: Secondary | ICD-10-CM | POA: Diagnosis not present

## 2017-01-11 DIAGNOSIS — E11621 Type 2 diabetes mellitus with foot ulcer: Secondary | ICD-10-CM | POA: Diagnosis not present

## 2017-01-11 DIAGNOSIS — R2689 Other abnormalities of gait and mobility: Secondary | ICD-10-CM | POA: Diagnosis not present

## 2017-01-11 DIAGNOSIS — I10 Essential (primary) hypertension: Secondary | ICD-10-CM | POA: Diagnosis not present

## 2017-01-11 DIAGNOSIS — L97422 Non-pressure chronic ulcer of left heel and midfoot with fat layer exposed: Secondary | ICD-10-CM | POA: Diagnosis not present

## 2017-01-16 DIAGNOSIS — I10 Essential (primary) hypertension: Secondary | ICD-10-CM | POA: Diagnosis not present

## 2017-01-16 DIAGNOSIS — L97422 Non-pressure chronic ulcer of left heel and midfoot with fat layer exposed: Secondary | ICD-10-CM | POA: Diagnosis not present

## 2017-01-16 DIAGNOSIS — K5900 Constipation, unspecified: Secondary | ICD-10-CM | POA: Diagnosis not present

## 2017-01-16 DIAGNOSIS — M6281 Muscle weakness (generalized): Secondary | ICD-10-CM | POA: Diagnosis not present

## 2017-01-16 DIAGNOSIS — E11621 Type 2 diabetes mellitus with foot ulcer: Secondary | ICD-10-CM | POA: Diagnosis not present

## 2017-01-16 DIAGNOSIS — R2689 Other abnormalities of gait and mobility: Secondary | ICD-10-CM | POA: Diagnosis not present

## 2017-01-18 DIAGNOSIS — L97422 Non-pressure chronic ulcer of left heel and midfoot with fat layer exposed: Secondary | ICD-10-CM | POA: Diagnosis not present

## 2017-01-18 DIAGNOSIS — E11621 Type 2 diabetes mellitus with foot ulcer: Secondary | ICD-10-CM | POA: Diagnosis not present

## 2017-01-18 DIAGNOSIS — I10 Essential (primary) hypertension: Secondary | ICD-10-CM | POA: Diagnosis not present

## 2017-01-18 DIAGNOSIS — M6281 Muscle weakness (generalized): Secondary | ICD-10-CM | POA: Diagnosis not present

## 2017-01-18 DIAGNOSIS — K5900 Constipation, unspecified: Secondary | ICD-10-CM | POA: Diagnosis not present

## 2017-01-18 DIAGNOSIS — R2689 Other abnormalities of gait and mobility: Secondary | ICD-10-CM | POA: Diagnosis not present

## 2017-01-25 DIAGNOSIS — K5900 Constipation, unspecified: Secondary | ICD-10-CM | POA: Diagnosis not present

## 2017-01-25 DIAGNOSIS — M6281 Muscle weakness (generalized): Secondary | ICD-10-CM | POA: Diagnosis not present

## 2017-01-25 DIAGNOSIS — I10 Essential (primary) hypertension: Secondary | ICD-10-CM | POA: Diagnosis not present

## 2017-01-25 DIAGNOSIS — E11621 Type 2 diabetes mellitus with foot ulcer: Secondary | ICD-10-CM | POA: Diagnosis not present

## 2017-01-25 DIAGNOSIS — L97422 Non-pressure chronic ulcer of left heel and midfoot with fat layer exposed: Secondary | ICD-10-CM | POA: Diagnosis not present

## 2017-01-25 DIAGNOSIS — R2689 Other abnormalities of gait and mobility: Secondary | ICD-10-CM | POA: Diagnosis not present

## 2017-01-26 DIAGNOSIS — G629 Polyneuropathy, unspecified: Secondary | ICD-10-CM | POA: Diagnosis not present

## 2017-01-26 DIAGNOSIS — E118 Type 2 diabetes mellitus with unspecified complications: Secondary | ICD-10-CM | POA: Diagnosis not present

## 2017-01-26 DIAGNOSIS — E0789 Other specified disorders of thyroid: Secondary | ICD-10-CM | POA: Diagnosis not present

## 2017-01-26 DIAGNOSIS — Z79899 Other long term (current) drug therapy: Secondary | ICD-10-CM | POA: Diagnosis not present

## 2017-02-16 DIAGNOSIS — E118 Type 2 diabetes mellitus with unspecified complications: Secondary | ICD-10-CM | POA: Diagnosis not present

## 2017-02-16 DIAGNOSIS — E0590 Thyrotoxicosis, unspecified without thyrotoxic crisis or storm: Secondary | ICD-10-CM | POA: Diagnosis not present

## 2017-02-27 ENCOUNTER — Encounter: Payer: Self-pay | Admitting: Vascular Surgery

## 2017-03-08 ENCOUNTER — Ambulatory Visit (INDEPENDENT_AMBULATORY_CARE_PROVIDER_SITE_OTHER)
Admit: 2017-03-08 | Discharge: 2017-03-08 | Disposition: A | Discharge status: Home / Self Care | Payer: Medicare Other | Attending: Vascular Surgery | Admitting: Vascular Surgery

## 2017-03-08 ENCOUNTER — Ambulatory Visit (HOSPITAL_COMMUNITY)
Admission: RE | Admit: 2017-03-08 | Discharge: 2017-03-08 | Disposition: A | Discharge status: Home / Self Care | Payer: Medicare Other | Source: Ambulatory Visit | Attending: Vascular Surgery | Admitting: Vascular Surgery

## 2017-03-08 ENCOUNTER — Encounter: Payer: Self-pay | Admitting: Vascular Surgery

## 2017-03-08 ENCOUNTER — Ambulatory Visit (INDEPENDENT_AMBULATORY_CARE_PROVIDER_SITE_OTHER): Payer: Medicare Other | Admitting: Vascular Surgery

## 2017-03-08 VITALS — BP 142/83 | HR 145 | Temp 97.5°F | Resp 16 | Ht 72.0 in | Wt 183.0 lb

## 2017-03-08 DIAGNOSIS — I739 Peripheral vascular disease, unspecified: Secondary | ICD-10-CM

## 2017-03-08 DIAGNOSIS — R938 Abnormal findings on diagnostic imaging of other specified body structures: Secondary | ICD-10-CM | POA: Diagnosis not present

## 2017-03-08 DIAGNOSIS — Z48812 Encounter for surgical aftercare following surgery on the circulatory system: Secondary | ICD-10-CM

## 2017-03-08 DIAGNOSIS — R0989 Other specified symptoms and signs involving the circulatory and respiratory systems: Secondary | ICD-10-CM | POA: Diagnosis present

## 2017-03-08 DIAGNOSIS — Z87891 Personal history of nicotine dependence: Secondary | ICD-10-CM | POA: Diagnosis not present

## 2017-03-08 DIAGNOSIS — Z9289 Personal history of other medical treatment: Secondary | ICD-10-CM | POA: Diagnosis not present

## 2017-03-08 DIAGNOSIS — E1151 Type 2 diabetes mellitus with diabetic peripheral angiopathy without gangrene: Secondary | ICD-10-CM | POA: Insufficient documentation

## 2017-03-08 DIAGNOSIS — E785 Hyperlipidemia, unspecified: Secondary | ICD-10-CM | POA: Insufficient documentation

## 2017-03-08 DIAGNOSIS — I10 Essential (primary) hypertension: Secondary | ICD-10-CM | POA: Insufficient documentation

## 2017-03-08 DIAGNOSIS — Z95828 Presence of other vascular implants and grafts: Secondary | ICD-10-CM

## 2017-03-08 NOTE — Progress Notes (Signed)
Patient name: Ralph KatzJohn G [name] MRN: [id number] DOB: 12-21-1950 Sex: male  REASON FOR VISIT:    Follow up after angioplasty and stenting of the left external iliac artery.  HPI:   Ralph KatzJohn G [name] is a pleasant 66 y.o. male who presented with a nonhealing wound on his left foot. He was set up for arteriography and possible intervention. He had a preoperative toe pressure on the left of 70 mmHg. He had an absent left femoral pulse.  [facility]. On the left side, which was the side with the wound, there was mild diffuse disease of the left common iliac artery but no focal stenosis. The distal left common iliac artery and a 90% focal stenosis was successfully ballooned and stented. Below that, the common femoral artery on the left had some mild stenosis. The superficial femoral artery was patent with some disease at the Dr. canal. The below-knee popliteal artery was patent. There was two-vessel runoff on the left via the posterior tibial artery and peroneal arteries. The anterior tibial artery was occluded. He comes in for a follow up visit.  The patient is doing well and the wound on his left foot healed completely. He's had a fifth toe amputation site on the left. He denies any claudication or rest pain on either side.  Current Outpatient Prescriptions  Medication Sig Dispense Refill  . acetaminophen (TYLENOL) 325 MG tablet Take 650 mg by mouth every 6 (six) hours as needed for moderate pain.     Marland Kitchen. ampicillin (PRINCIPEN) 500 MG capsule Take 1 capsule (500 mg total) by mouth 3 (three) times daily. 15 capsule 0  . aspirin EC 81 MG tablet Take 81 mg by mouth daily.    Marland Kitchen. buPROPion (WELLBUTRIN XL) 300 MG 24 hr tablet Take 300 mg by mouth daily.    . canagliflozin (INVOKANA) 300 MG TABS tablet Take 300 mg by mouth daily before breakfast.    . clonazePAM (KLONOPIN) 0.5 MG tablet Take 0.5 mg by mouth 2 (two) times daily.    . divalproex (DEPAKOTE ER) 500 MG 24 hr tablet Take 1,000  mg by mouth every morning.    Marland Kitchen. doxycycline (VIBRAMYCIN) 100 MG capsule Take 1 capsule (100 mg total) by mouth 2 (two) times daily. 10 capsule 0  . feeding supplement, GLUCERNA SHAKE, (GLUCERNA SHAKE) LIQD Take 237 mLs by mouth 3 (three) times daily between meals. 90 Can 0  . HYDROcodone-acetaminophen (NORCO/VICODIN) 5-325 MG tablet Take 1 tablet by mouth every 4 (four) hours as needed for moderate pain. 30 tablet 0  . insulin detemir (LEVEMIR) 100 UNIT/ML injection Inject 26 Units into the skin every morning.    . Lactobacillus Casei-Folic Acid (RESTORA RX) 60-1.25 MG CAPS Take 1 capsule by mouth daily.    Marland Kitchen. levocetirizine (XYZAL) 5 MG tablet Take 5 mg by mouth every morning.    Marland Kitchen. levothyroxine (SYNTHROID, LEVOTHROID) 200 MCG tablet Take 200 mcg by mouth daily before breakfast.    . liraglutide (VICTOZA) 18 MG/3ML SOPN Inject 1.8 mg into the skin every morning.    . meloxicam (MOBIC) 7.5 MG tablet Take 7.5 mg by mouth daily.    . metFORMIN (GLUCOPHAGE-XR) 500 MG 24 hr tablet Take 1,000 mg by mouth 2 (two) times daily.    . Multiple Vitamin (MULTIVITAMIN WITH MINERALS) TABS tablet Take 1 tablet by mouth daily.    . Pancrelipase, Lip-Prot-Amyl, (CREON) 24000-76000 units CPEP Take 3 capsules by mouth 3 (three) times daily with  meals. Along with 120000 units of amylase-included in capsules    . Polyethyl Glycol-Propyl Glycol (SYSTANE) 0.4-0.3 % SOLN Apply 1 drop to eye 4 (four) times daily.    . polyethylene glycol (MIRALAX / GLYCOLAX) packet Take 17 g by mouth daily as needed for mild constipation. 14 each 0  . pregabalin (LYRICA) 75 MG capsule Take 75 mg by mouth 3 (three) times daily.    . ramipril (ALTACE) 5 MG capsule Take 5 mg by mouth daily.    . simvastatin (ZOCOR) 40 MG tablet Take 40 mg by mouth at bedtime.    . tamsulosin (FLOMAX) 0.4 MG CAPS capsule Take 0.4 mg by mouth daily after breakfast.    . White Petrolatum-Mineral Oil (SYSTANE NIGHTTIME) OINT Place 1 application into both eyes at  bedtime.     No current facility-administered medications for this visit.     REVIEW OF SYSTEMS:  [X]  denotes positive finding, [ ]  denotes negative finding Cardiac  Comments:  Chest pain or chest pressure:    Shortness of breath upon exertion:    Short of breath when lying flat:    Irregular heart rhythm:    Constitutional    Fever or chills:     PHYSICAL EXAM:   Vitals:   03/08/17 1422  BP: (!) 142/83  Pulse: (!) 145  Resp: 16  Temp: (!) 97.5 F (36.4 C)  TempSrc: Oral  SpO2: 91%  Weight: 183 lb (83 kg)  Height: 6' (1.829 m)    GENERAL: The patient is a well-nourished male, in no acute distress. The vital signs are documented above. CARDIOVASCULAR: There is a regular rate and rhythm. PULMONARY: There is good air exchange bilaterally without wheezing or rales. He has palpable femoral pulses. The wound on the left foot has healed completely.  DATA:   ARTERIAL DOPPLER STUDY: I have independently interpreted his arterial Doppler study today.   On the left side, which is the side that was intervened on, there is a biphasic posterior tibial signal and biphasic dorsalis pedis signal. ABI on the left is 100%. Toe pressure on the left is 108 mmHg.  On the right side, the patient has a biphasic dorsalis pedis signal with a monophasic posterior tibial signal. ABI on the right is 69%.  MEDICAL ISSUES:   STATUS POST ANGIOPLASTY AND STENTING OF THE LEFT EXTERNAL ILIAC ARTERY: This stent is patent with an ABI of 100% on the left. The wound on the foot has healed. I've ordered a follow up study in 3 months and I'll see him back at that time. I've also written him a prescription for diabetic shoes. He knows to call sooner if he has problems.  [name] [facility] [phone]

## 2017-03-10 DIAGNOSIS — F205 Residual schizophrenia: Secondary | ICD-10-CM | POA: Diagnosis not present

## 2017-03-10 NOTE — Addendum Note (Signed)
Addended by: Burton ApleyPETTY, Evaristo Tsuda A on: 03/10/2017 03:08 PM   Modules accepted: Orders

## 2017-04-06 DIAGNOSIS — E039 Hypothyroidism, unspecified: Secondary | ICD-10-CM | POA: Diagnosis not present

## 2017-04-06 DIAGNOSIS — E118 Type 2 diabetes mellitus with unspecified complications: Secondary | ICD-10-CM | POA: Diagnosis not present

## 2017-04-17 DIAGNOSIS — E1140 Type 2 diabetes mellitus with diabetic neuropathy, unspecified: Secondary | ICD-10-CM | POA: Diagnosis not present

## 2017-04-17 DIAGNOSIS — E785 Hyperlipidemia, unspecified: Secondary | ICD-10-CM | POA: Diagnosis not present

## 2017-04-17 DIAGNOSIS — K089 Disorder of teeth and supporting structures, unspecified: Secondary | ICD-10-CM | POA: Diagnosis not present

## 2017-04-17 DIAGNOSIS — G629 Polyneuropathy, unspecified: Secondary | ICD-10-CM | POA: Diagnosis not present

## 2017-04-17 DIAGNOSIS — L84 Corns and callosities: Secondary | ICD-10-CM | POA: Diagnosis not present

## 2017-04-17 DIAGNOSIS — Q845 Enlarged and hypertrophic nails: Secondary | ICD-10-CM | POA: Diagnosis not present

## 2017-04-17 DIAGNOSIS — E038 Other specified hypothyroidism: Secondary | ICD-10-CM | POA: Diagnosis not present

## 2017-04-17 DIAGNOSIS — B351 Tinea unguium: Secondary | ICD-10-CM | POA: Diagnosis not present

## 2017-04-18 DIAGNOSIS — F209 Schizophrenia, unspecified: Secondary | ICD-10-CM | POA: Diagnosis not present

## 2017-04-18 DIAGNOSIS — R41841 Cognitive communication deficit: Secondary | ICD-10-CM | POA: Diagnosis not present

## 2017-04-18 DIAGNOSIS — F329 Major depressive disorder, single episode, unspecified: Secondary | ICD-10-CM | POA: Diagnosis not present

## 2017-04-18 DIAGNOSIS — E119 Type 2 diabetes mellitus without complications: Secondary | ICD-10-CM | POA: Diagnosis not present

## 2017-04-18 DIAGNOSIS — I10 Essential (primary) hypertension: Secondary | ICD-10-CM | POA: Diagnosis not present

## 2017-04-18 DIAGNOSIS — Z794 Long term (current) use of insulin: Secondary | ICD-10-CM | POA: Diagnosis not present

## 2017-04-20 NOTE — Addendum Note (Signed)
Addendum  created 04/20/17 1201 by Sung Parodi, MD   Sign clinical note    

## 2017-04-22 DIAGNOSIS — I10 Essential (primary) hypertension: Secondary | ICD-10-CM | POA: Diagnosis not present

## 2017-04-22 DIAGNOSIS — E119 Type 2 diabetes mellitus without complications: Secondary | ICD-10-CM | POA: Diagnosis not present

## 2017-04-22 DIAGNOSIS — F329 Major depressive disorder, single episode, unspecified: Secondary | ICD-10-CM | POA: Diagnosis not present

## 2017-04-22 DIAGNOSIS — R41841 Cognitive communication deficit: Secondary | ICD-10-CM | POA: Diagnosis not present

## 2017-04-22 DIAGNOSIS — F209 Schizophrenia, unspecified: Secondary | ICD-10-CM | POA: Diagnosis not present

## 2017-04-22 DIAGNOSIS — Z794 Long term (current) use of insulin: Secondary | ICD-10-CM | POA: Diagnosis not present

## 2017-04-25 DIAGNOSIS — I10 Essential (primary) hypertension: Secondary | ICD-10-CM | POA: Diagnosis not present

## 2017-04-25 DIAGNOSIS — R41841 Cognitive communication deficit: Secondary | ICD-10-CM | POA: Diagnosis not present

## 2017-04-25 DIAGNOSIS — E119 Type 2 diabetes mellitus without complications: Secondary | ICD-10-CM | POA: Diagnosis not present

## 2017-04-25 DIAGNOSIS — F209 Schizophrenia, unspecified: Secondary | ICD-10-CM | POA: Diagnosis not present

## 2017-04-25 DIAGNOSIS — E1140 Type 2 diabetes mellitus with diabetic neuropathy, unspecified: Secondary | ICD-10-CM | POA: Diagnosis not present

## 2017-04-25 DIAGNOSIS — Z79899 Other long term (current) drug therapy: Secondary | ICD-10-CM | POA: Diagnosis not present

## 2017-04-25 DIAGNOSIS — F329 Major depressive disorder, single episode, unspecified: Secondary | ICD-10-CM | POA: Diagnosis not present

## 2017-04-25 DIAGNOSIS — E038 Other specified hypothyroidism: Secondary | ICD-10-CM | POA: Diagnosis not present

## 2017-04-25 DIAGNOSIS — E785 Hyperlipidemia, unspecified: Secondary | ICD-10-CM | POA: Diagnosis not present

## 2017-04-25 DIAGNOSIS — Z794 Long term (current) use of insulin: Secondary | ICD-10-CM | POA: Diagnosis not present

## 2017-04-25 DIAGNOSIS — G629 Polyneuropathy, unspecified: Secondary | ICD-10-CM | POA: Diagnosis not present

## 2017-04-28 DIAGNOSIS — E119 Type 2 diabetes mellitus without complications: Secondary | ICD-10-CM | POA: Diagnosis not present

## 2017-04-28 DIAGNOSIS — Z794 Long term (current) use of insulin: Secondary | ICD-10-CM | POA: Diagnosis not present

## 2017-04-28 DIAGNOSIS — I10 Essential (primary) hypertension: Secondary | ICD-10-CM | POA: Diagnosis not present

## 2017-04-28 DIAGNOSIS — F329 Major depressive disorder, single episode, unspecified: Secondary | ICD-10-CM | POA: Diagnosis not present

## 2017-04-28 DIAGNOSIS — F209 Schizophrenia, unspecified: Secondary | ICD-10-CM | POA: Diagnosis not present

## 2017-04-28 DIAGNOSIS — R41841 Cognitive communication deficit: Secondary | ICD-10-CM | POA: Diagnosis not present

## 2017-05-08 DIAGNOSIS — K8689 Other specified diseases of pancreas: Secondary | ICD-10-CM | POA: Diagnosis not present

## 2017-05-08 DIAGNOSIS — Z79899 Other long term (current) drug therapy: Secondary | ICD-10-CM | POA: Diagnosis not present

## 2017-05-08 DIAGNOSIS — M1990 Unspecified osteoarthritis, unspecified site: Secondary | ICD-10-CM | POA: Diagnosis not present

## 2017-05-08 DIAGNOSIS — J3089 Other allergic rhinitis: Secondary | ICD-10-CM | POA: Diagnosis not present

## 2017-05-08 DIAGNOSIS — R030 Elevated blood-pressure reading, without diagnosis of hypertension: Secondary | ICD-10-CM | POA: Diagnosis not present

## 2017-05-08 DIAGNOSIS — H04123 Dry eye syndrome of bilateral lacrimal glands: Secondary | ICD-10-CM | POA: Diagnosis not present

## 2017-05-09 DIAGNOSIS — Z794 Long term (current) use of insulin: Secondary | ICD-10-CM | POA: Diagnosis not present

## 2017-05-09 DIAGNOSIS — F329 Major depressive disorder, single episode, unspecified: Secondary | ICD-10-CM | POA: Diagnosis not present

## 2017-05-09 DIAGNOSIS — I10 Essential (primary) hypertension: Secondary | ICD-10-CM | POA: Diagnosis not present

## 2017-05-09 DIAGNOSIS — R41841 Cognitive communication deficit: Secondary | ICD-10-CM | POA: Diagnosis not present

## 2017-05-09 DIAGNOSIS — F209 Schizophrenia, unspecified: Secondary | ICD-10-CM | POA: Diagnosis not present

## 2017-05-09 DIAGNOSIS — E119 Type 2 diabetes mellitus without complications: Secondary | ICD-10-CM | POA: Diagnosis not present

## 2017-06-02 DIAGNOSIS — E785 Hyperlipidemia, unspecified: Secondary | ICD-10-CM | POA: Diagnosis not present

## 2017-06-02 DIAGNOSIS — E1140 Type 2 diabetes mellitus with diabetic neuropathy, unspecified: Secondary | ICD-10-CM | POA: Diagnosis not present

## 2017-06-02 DIAGNOSIS — Z79899 Other long term (current) drug therapy: Secondary | ICD-10-CM | POA: Diagnosis not present

## 2017-06-05 DIAGNOSIS — Z79899 Other long term (current) drug therapy: Secondary | ICD-10-CM | POA: Diagnosis not present

## 2017-06-05 DIAGNOSIS — F3289 Other specified depressive episodes: Secondary | ICD-10-CM | POA: Diagnosis not present

## 2017-06-05 DIAGNOSIS — R000 Tachycardia, unspecified: Secondary | ICD-10-CM | POA: Diagnosis not present

## 2017-06-05 DIAGNOSIS — N400 Enlarged prostate without lower urinary tract symptoms: Secondary | ICD-10-CM | POA: Diagnosis not present

## 2017-06-05 DIAGNOSIS — R030 Elevated blood-pressure reading, without diagnosis of hypertension: Secondary | ICD-10-CM | POA: Diagnosis not present

## 2017-06-12 DIAGNOSIS — F2089 Other schizophrenia: Secondary | ICD-10-CM | POA: Diagnosis not present

## 2017-06-12 DIAGNOSIS — R030 Elevated blood-pressure reading, without diagnosis of hypertension: Secondary | ICD-10-CM | POA: Diagnosis not present

## 2017-06-12 DIAGNOSIS — R748 Abnormal levels of other serum enzymes: Secondary | ICD-10-CM | POA: Diagnosis not present

## 2017-06-12 DIAGNOSIS — E46 Unspecified protein-calorie malnutrition: Secondary | ICD-10-CM | POA: Diagnosis not present

## 2017-06-12 DIAGNOSIS — K259 Gastric ulcer, unspecified as acute or chronic, without hemorrhage or perforation: Secondary | ICD-10-CM | POA: Diagnosis not present

## 2017-06-12 DIAGNOSIS — Z79899 Other long term (current) drug therapy: Secondary | ICD-10-CM | POA: Diagnosis not present

## 2017-06-12 DIAGNOSIS — E038 Other specified hypothyroidism: Secondary | ICD-10-CM | POA: Diagnosis not present

## 2017-06-12 DIAGNOSIS — E119 Type 2 diabetes mellitus without complications: Secondary | ICD-10-CM | POA: Diagnosis not present

## 2017-06-13 DIAGNOSIS — M2011 Hallux valgus (acquired), right foot: Secondary | ICD-10-CM | POA: Diagnosis not present

## 2017-06-13 DIAGNOSIS — R262 Difficulty in walking, not elsewhere classified: Secondary | ICD-10-CM | POA: Diagnosis not present

## 2017-06-13 DIAGNOSIS — E1140 Type 2 diabetes mellitus with diabetic neuropathy, unspecified: Secondary | ICD-10-CM | POA: Diagnosis not present

## 2017-06-13 DIAGNOSIS — B351 Tinea unguium: Secondary | ICD-10-CM | POA: Diagnosis not present

## 2017-06-16 DIAGNOSIS — Z79899 Other long term (current) drug therapy: Secondary | ICD-10-CM | POA: Diagnosis not present

## 2017-06-16 DIAGNOSIS — E1140 Type 2 diabetes mellitus with diabetic neuropathy, unspecified: Secondary | ICD-10-CM | POA: Diagnosis not present

## 2017-06-16 DIAGNOSIS — F418 Other specified anxiety disorders: Secondary | ICD-10-CM | POA: Diagnosis not present

## 2017-06-16 DIAGNOSIS — F329 Major depressive disorder, single episode, unspecified: Secondary | ICD-10-CM | POA: Diagnosis not present

## 2017-06-16 DIAGNOSIS — F209 Schizophrenia, unspecified: Secondary | ICD-10-CM | POA: Diagnosis not present

## 2017-06-21 ENCOUNTER — Encounter (HOSPITAL_COMMUNITY): Payer: Medicare Other

## 2017-06-21 ENCOUNTER — Ambulatory Visit: Payer: Medicare Other | Admitting: Vascular Surgery

## 2017-07-03 DIAGNOSIS — E119 Type 2 diabetes mellitus without complications: Secondary | ICD-10-CM | POA: Diagnosis not present

## 2017-07-03 DIAGNOSIS — I10 Essential (primary) hypertension: Secondary | ICD-10-CM | POA: Diagnosis not present

## 2017-07-03 DIAGNOSIS — K8681 Exocrine pancreatic insufficiency: Secondary | ICD-10-CM | POA: Diagnosis not present

## 2017-07-03 DIAGNOSIS — R000 Tachycardia, unspecified: Secondary | ICD-10-CM | POA: Diagnosis not present

## 2017-07-03 DIAGNOSIS — J3089 Other allergic rhinitis: Secondary | ICD-10-CM | POA: Diagnosis not present

## 2017-07-03 DIAGNOSIS — Z79899 Other long term (current) drug therapy: Secondary | ICD-10-CM | POA: Diagnosis not present

## 2017-07-10 DIAGNOSIS — F2089 Other schizophrenia: Secondary | ICD-10-CM | POA: Diagnosis not present

## 2017-07-10 DIAGNOSIS — Z79899 Other long term (current) drug therapy: Secondary | ICD-10-CM | POA: Diagnosis not present

## 2017-07-10 DIAGNOSIS — N400 Enlarged prostate without lower urinary tract symptoms: Secondary | ICD-10-CM | POA: Diagnosis not present

## 2017-07-10 DIAGNOSIS — E119 Type 2 diabetes mellitus without complications: Secondary | ICD-10-CM | POA: Diagnosis not present

## 2017-07-10 DIAGNOSIS — I10 Essential (primary) hypertension: Secondary | ICD-10-CM | POA: Diagnosis not present

## 2017-07-12 DIAGNOSIS — Z23 Encounter for immunization: Secondary | ICD-10-CM | POA: Diagnosis not present

## 2017-07-13 DIAGNOSIS — E038 Other specified hypothyroidism: Secondary | ICD-10-CM | POA: Diagnosis not present

## 2017-07-17 DIAGNOSIS — Z79899 Other long term (current) drug therapy: Secondary | ICD-10-CM | POA: Diagnosis not present

## 2017-07-17 DIAGNOSIS — F418 Other specified anxiety disorders: Secondary | ICD-10-CM | POA: Diagnosis not present

## 2017-07-17 DIAGNOSIS — E1140 Type 2 diabetes mellitus with diabetic neuropathy, unspecified: Secondary | ICD-10-CM | POA: Diagnosis not present

## 2017-07-17 DIAGNOSIS — I10 Essential (primary) hypertension: Secondary | ICD-10-CM | POA: Diagnosis not present

## 2017-07-17 DIAGNOSIS — E038 Other specified hypothyroidism: Secondary | ICD-10-CM | POA: Diagnosis not present

## 2017-07-17 DIAGNOSIS — F2089 Other schizophrenia: Secondary | ICD-10-CM | POA: Diagnosis not present

## 2017-07-20 DIAGNOSIS — E119 Type 2 diabetes mellitus without complications: Secondary | ICD-10-CM | POA: Diagnosis not present

## 2017-07-20 DIAGNOSIS — R000 Tachycardia, unspecified: Secondary | ICD-10-CM | POA: Diagnosis not present

## 2017-07-20 DIAGNOSIS — I10 Essential (primary) hypertension: Secondary | ICD-10-CM | POA: Diagnosis not present

## 2017-07-20 DIAGNOSIS — E785 Hyperlipidemia, unspecified: Secondary | ICD-10-CM | POA: Diagnosis not present

## 2017-08-08 DIAGNOSIS — E1140 Type 2 diabetes mellitus with diabetic neuropathy, unspecified: Secondary | ICD-10-CM | POA: Diagnosis not present

## 2017-08-08 DIAGNOSIS — K219 Gastro-esophageal reflux disease without esophagitis: Secondary | ICD-10-CM | POA: Diagnosis not present

## 2017-08-08 DIAGNOSIS — R000 Tachycardia, unspecified: Secondary | ICD-10-CM | POA: Diagnosis not present

## 2017-08-08 DIAGNOSIS — Z79899 Other long term (current) drug therapy: Secondary | ICD-10-CM | POA: Diagnosis not present

## 2017-08-08 DIAGNOSIS — F2089 Other schizophrenia: Secondary | ICD-10-CM | POA: Diagnosis not present

## 2017-08-08 DIAGNOSIS — F418 Other specified anxiety disorders: Secondary | ICD-10-CM | POA: Diagnosis not present

## 2017-08-08 DIAGNOSIS — R609 Edema, unspecified: Secondary | ICD-10-CM | POA: Diagnosis not present

## 2017-08-25 DIAGNOSIS — K219 Gastro-esophageal reflux disease without esophagitis: Secondary | ICD-10-CM | POA: Diagnosis not present

## 2017-08-25 DIAGNOSIS — E038 Other specified hypothyroidism: Secondary | ICD-10-CM | POA: Diagnosis not present

## 2017-08-25 DIAGNOSIS — R000 Tachycardia, unspecified: Secondary | ICD-10-CM | POA: Diagnosis not present

## 2017-08-25 DIAGNOSIS — E1140 Type 2 diabetes mellitus with diabetic neuropathy, unspecified: Secondary | ICD-10-CM | POA: Diagnosis not present

## 2017-08-25 DIAGNOSIS — I10 Essential (primary) hypertension: Secondary | ICD-10-CM | POA: Diagnosis not present

## 2017-08-31 DIAGNOSIS — Z79899 Other long term (current) drug therapy: Secondary | ICD-10-CM | POA: Diagnosis not present

## 2017-08-31 DIAGNOSIS — E038 Other specified hypothyroidism: Secondary | ICD-10-CM | POA: Diagnosis not present

## 2017-09-04 DIAGNOSIS — J3089 Other allergic rhinitis: Secondary | ICD-10-CM | POA: Diagnosis not present

## 2017-09-04 DIAGNOSIS — E038 Other specified hypothyroidism: Secondary | ICD-10-CM | POA: Diagnosis not present

## 2017-09-04 DIAGNOSIS — Z79899 Other long term (current) drug therapy: Secondary | ICD-10-CM | POA: Diagnosis not present

## 2017-09-04 DIAGNOSIS — E1140 Type 2 diabetes mellitus with diabetic neuropathy, unspecified: Secondary | ICD-10-CM | POA: Diagnosis not present

## 2017-09-04 DIAGNOSIS — I10 Essential (primary) hypertension: Secondary | ICD-10-CM | POA: Diagnosis not present

## 2017-09-18 DIAGNOSIS — N400 Enlarged prostate without lower urinary tract symptoms: Secondary | ICD-10-CM | POA: Diagnosis not present

## 2017-09-18 DIAGNOSIS — E1149 Type 2 diabetes mellitus with other diabetic neurological complication: Secondary | ICD-10-CM | POA: Diagnosis not present

## 2017-09-18 DIAGNOSIS — M1990 Unspecified osteoarthritis, unspecified site: Secondary | ICD-10-CM | POA: Diagnosis not present

## 2017-09-18 DIAGNOSIS — Z79899 Other long term (current) drug therapy: Secondary | ICD-10-CM | POA: Diagnosis not present

## 2017-09-18 DIAGNOSIS — F418 Other specified anxiety disorders: Secondary | ICD-10-CM | POA: Diagnosis not present

## 2017-10-04 DIAGNOSIS — E118 Type 2 diabetes mellitus with unspecified complications: Secondary | ICD-10-CM | POA: Diagnosis not present

## 2017-10-04 DIAGNOSIS — E119 Type 2 diabetes mellitus without complications: Secondary | ICD-10-CM | POA: Diagnosis not present

## 2017-10-04 DIAGNOSIS — E039 Hypothyroidism, unspecified: Secondary | ICD-10-CM | POA: Diagnosis not present

## 2017-10-05 DIAGNOSIS — E119 Type 2 diabetes mellitus without complications: Secondary | ICD-10-CM | POA: Diagnosis not present

## 2017-10-05 DIAGNOSIS — I10 Essential (primary) hypertension: Secondary | ICD-10-CM | POA: Diagnosis not present

## 2017-10-06 ENCOUNTER — Encounter: Payer: Self-pay | Admitting: Family Medicine

## 2017-10-06 ENCOUNTER — Encounter: Payer: Self-pay | Admitting: Gastroenterology

## 2017-10-06 ENCOUNTER — Ambulatory Visit (INDEPENDENT_AMBULATORY_CARE_PROVIDER_SITE_OTHER): Payer: Medicare Other | Admitting: Family Medicine

## 2017-10-06 VITALS — BP 140/80 | HR 90 | Temp 98.6°F | Resp 14 | Ht 72.0 in | Wt 185.0 lb

## 2017-10-06 DIAGNOSIS — N401 Enlarged prostate with lower urinary tract symptoms: Secondary | ICD-10-CM | POA: Diagnosis not present

## 2017-10-06 DIAGNOSIS — R5383 Other fatigue: Secondary | ICD-10-CM

## 2017-10-06 DIAGNOSIS — Z1211 Encounter for screening for malignant neoplasm of colon: Secondary | ICD-10-CM | POA: Diagnosis not present

## 2017-10-06 LAB — POCT URINALYSIS DIP (DEVICE)
BILIRUBIN URINE: NEGATIVE
Glucose, UA: NEGATIVE mg/dL
KETONES UR: NEGATIVE mg/dL
Nitrite: NEGATIVE
PH: 6.5 (ref 5.0–8.0)
Protein, ur: 100 mg/dL — AB
Specific Gravity, Urine: 1.02 (ref 1.005–1.030)
Urobilinogen, UA: 0.2 mg/dL (ref 0.0–1.0)

## 2017-10-06 NOTE — Patient Instructions (Signed)
I have placed your referrals for colonoscopy and for urology. I will contact [facility] provider [facility] to determine which labs you've had completed already.  I will see you back in 4 weeks.

## 2017-10-06 NOTE — Progress Notes (Signed)
Patient ID: Ralph [name], male    DOB: 20-Apr-1951, 67 y.o.   MRN: [id number]  PCP: Bing Neighbors, FNP  Chief Complaint  Patient presents with  . Establish Care  . Fatigue    Subjective:  HPI Ralph [name] is a 67 y.o. male, who is a very poor historian, presents alone today to establish care evaluation of fatigue. Medical problems significant for hypertension, type 2 diabetes, BPH, anxiety, depression, diabetic neuropathy, and thyroid disease.  He is a resident of [address] in [place].  He reports that he is followed by endocrinologist named Dr. Tedd Sias however he is unable to identify what practice Dr. Tedd Sias is associated with.  He reports that he had a recent visit on yesterday and received a complete workup of labs however was unable to identify which labs were processed on yesterday.  He reports that he is here today to establish with a primary care provider and would like a referral to urology for further workup of his BPH and evaluation of ongoing fatigue. He denies recent changes to ADLs.. Reports no known history of anemia or dark tarry stools. Denies chest pain, shortness of breath, dizziness, and or headaches. Reports increased urinary frequency and urgency in spite of therapy with Flomax. He reports he was a prior patient at [facility], although has been lost to follow-up for sometime. Social History   Socioeconomic History  . Marital status: Married    Spouse name: Not on file  . Number of children: Not on file  . Years of education: Not on file  . Highest education level: Not on file  Social Needs  . Financial resource strain: Not on file  . Food insecurity - worry: Not on file  . Food insecurity - inability: Not on file  . Transportation needs - medical: Not on file  . Transportation needs - non-medical: Not on file  Occupational History  . Not on file  Tobacco Use  . Smoking status: Former Games developer  . Smokeless tobacco: Never Used  Substance  and Sexual Activity  . Alcohol use: No  . Drug use: No  . Sexual activity: Not on file  Other Topics Concern  . Not on file  Social History Narrative  . Not on file    Family History  Problem Relation Age of Onset  . Diabetes Mellitus II Mother   . Heart disease Father   . Lung cancer Father   . Diabetes Mellitus II Paternal Grandfather    Review of Systems  Constitutional: Positive for activity change and fatigue.  HENT: Negative.   Respiratory: Negative.   Cardiovascular: Negative.   Genitourinary: Positive for frequency.       Urine retention   Musculoskeletal: Negative.   Skin: Negative.   Neurological: Negative.   Psychiatric/Behavioral: Negative.     Patient Active Problem List   Diagnosis Date Noted  . Malnutrition of moderate degree 11/26/2016  . Septic arthritis of foot (HCC) 11/26/2016  . Open wound of left foot 11/25/2016  . Thyroid disease   . Hypertension   . Diabetes mellitus without complication (HCC)   . Depression   . BPH (benign prostatic hyperplasia)   . Anxiety     Allergies  Allergen Reactions  . Antihistamines, Chlorpheniramine-Type Other (See Comments)    Per MAR  . Antihistamines, Diphenhydramine-Type Other (See Comments)    Per MAR  . Antihistamines, Loratadine-Type Other (See Comments)    Per MAR  . Benadryl [Diphenhydramine Hcl] Other (  See Comments)  . Ethanolamine Other (See Comments)    Per MAR    Prior to Admission medications   Medication Sig Start Date End Date Taking? Authorizing Provider  acetaminophen (TYLENOL) 325 MG tablet Take 650 mg by mouth every 6 (six) hours as needed for moderate pain.    Yes [provider]  ampicillin (PRINCIPEN) 500 MG capsule Take 1 capsule (500 mg total) by mouth 3 (three) times daily. 11/30/16  Yes Mikhail, Maryann, DO  Amylase-Lipase-Protease (CREON 5 PO) Take by mouth.   Yes [provider]  aspirin EC 81 MG tablet Take 81 mg by mouth daily.   Yes [provider]   buPROPion (WELLBUTRIN XL) 300 MG 24 hr tablet Take 300 mg by mouth daily.   Yes [provider]  canagliflozin (INVOKANA) 300 MG TABS tablet Take 300 mg by mouth daily before breakfast.   Yes [provider]  clonazePAM (KLONOPIN) 0.5 MG tablet Take 0.5 mg by mouth 2 (two) times daily.   Yes [provider]  divalproex (DEPAKOTE ER) 500 MG 24 hr tablet Take 1,000 mg by mouth every morning.   Yes [provider]  doxycycline (VIBRAMYCIN) 100 MG capsule Take 1 capsule (100 mg total) by mouth 2 (two) times daily. 11/30/16  Yes Mikhail, Nita Sells, DO  empagliflozin (JARDIANCE) 10 MG TABS tablet Take 10 mg by mouth daily.   Yes [provider]  feeding supplement, GLUCERNA SHAKE, (GLUCERNA SHAKE) LIQD Take 237 mLs by mouth 3 (three) times daily between meals. 11/30/16  Yes Mikhail, [place], DO  HYDROcodone-acetaminophen (NORCO/VICODIN) 5-325 MG tablet Take 1 tablet by mouth every 4 (four) hours as needed for moderate pain. 11/30/16  Yes Mikhail, Nita Sells, DO  insulin detemir (LEVEMIR) 100 UNIT/ML injection Inject 26 Units into the skin every morning.   Yes [provider]  Lactobacillus Casei-Folic Acid (RESTORA RX) 60-1.25 MG CAPS Take 1 capsule by mouth daily.   Yes [provider]  levocetirizine (XYZAL) 5 MG tablet Take 5 mg by mouth every morning.   Yes [provider]  levothyroxine (SYNTHROID, LEVOTHROID) 200 MCG tablet Take 200 mcg by mouth daily before breakfast.   Yes [provider]  linaclotide (LINZESS) 145 MCG CAPS capsule Take 145 mcg by mouth daily before breakfast.   Yes [provider]  liraglutide (VICTOZA) 18 MG/3ML SOPN Inject 1.8 mg into the skin every morning.   Yes [provider]  meloxicam (MOBIC) 7.5 MG tablet Take 7.5 mg by mouth daily.   Yes [provider]  metFORMIN (GLUCOPHAGE-XR) 500 MG 24 hr tablet Take 1,000 mg by mouth 2 (two) times daily.   Yes [provider]   Multiple Vitamin (MULTIVITAMIN WITH MINERALS) TABS tablet Take 1 tablet by mouth daily.   Yes [provider]  Multiple Vitamins-Minerals (CEROVITE SENIOR) TABS Take by mouth.   Yes [provider]  Pancrelipase, Lip-Prot-Amyl, (CREON) 24000-76000 units CPEP Take 3 capsules by mouth 3 (three) times daily with meals. Along with 120000 units of amylase-included in capsules   Yes [provider]  Polyethyl Glycol-Propyl Glycol (SYSTANE) 0.4-0.3 % SOLN Apply 1 drop to eye 4 (four) times daily.   Yes [provider]  polyethylene glycol (MIRALAX / GLYCOLAX) packet Take 17 g by mouth daily as needed for mild constipation. 11/30/16  Yes Mikhail, Maryann, DO  pregabalin (LYRICA) 75 MG capsule Take 75 mg by mouth 3 (three) times daily.   Yes [provider]  Probiotic Product (RESTORA PO) Take  by mouth.   Yes [provider]  ramipril (ALTACE) 5 MG capsule Take 5 mg by mouth daily.   Yes [provider]  ranitidine (ZANTAC) 150 MG tablet Take 150 mg by mouth 2 (two) times daily.   Yes [provider]  simvastatin (ZOCOR) 40 MG tablet Take 40 mg by mouth at bedtime.   Yes [provider]  tamsulosin (FLOMAX) 0.4 MG CAPS capsule Take 0.4 mg by mouth daily after breakfast.   Yes [provider]  tolterodine (DETROL LA) 4 MG 24 hr capsule Take 4 mg by mouth daily.   Yes [provider]  traMADol (ULTRAM) 50 MG tablet Take by mouth every 6 (six) hours as needed.   Yes [provider]  White Petrolatum-Mineral Oil (SYSTANE NIGHTTIME) OINT Place 1 application into both eyes at bedtime.   Yes [provider]    Past Medical, Surgical Family and Social History reviewed and updated.    Objective:   Today's Vitals   10/06/17 0834 10/06/17 0902  BP: (!) 146/74 140/80  Pulse: 90   Resp: 14   Temp: 98.6 F (37 C)   TempSrc: Oral   SpO2: 98%   Weight: 185 lb (83.9 kg)   Height: 6' (1.829 m)      Wt Readings from Last 3 Encounters:  10/06/17 185 lb (83.9 kg)  03/08/17 183 lb (83 kg)  11/26/16 187 lb (84.8 kg)    Physical Exam  Constitutional: He is oriented to person, place, and time. He appears well-developed and well-nourished.  HENT:  Head: Normocephalic and atraumatic.  Eyes: Conjunctivae and EOM are normal. Pupils are equal, round, and reactive to light.  Neck: Normal range of motion. Neck supple.  Cardiovascular: Normal rate, regular rhythm, normal heart sounds and intact distal pulses.  Pulmonary/Chest: Effort normal and breath sounds normal.  Abdominal: He exhibits no distension and no mass. There is no tenderness. There is no rebound and no guarding.  Musculoskeletal: Normal range of motion.  Lymphadenopathy:    He has no cervical adenopathy.  Neurological: He is alert and oriented to person, place, and time.  Skin: Skin is warm and dry.  Psychiatric: He has a normal mood and affect. His behavior is normal. Judgment and thought content normal.   Assessment & Plan:  1. Colon cancer screening, no prior colonoscopy or family hx of colon cancer. Will refer to GI for procedure. No prior medical records on file.  2. Benign prostatic hyperplasia with lower urinary tract symptoms, symptom details unspecified, he is currently symptomatic of BPH symptoms.  He has had no recent urology follow-up per the limited history patient provides today.  [facility] for further evaluation and workup.  For now he will continue Flomax at the prescribed dose. Ambulatory referral to Urology  3. Fatigue, will check an EKG 12-lead to rule out any cardiac dysrhythmias.  Patient has had recent lab work performed at another facility.  Will attempt to obtain those results and evaluate patient for anemia.  Orders Placed This Encounter  Procedures  . Ambulatory referral to Urology    Referral Priority:   Routine    Referral Type:   Consultation    Referral Reason:    Specialty Services Required    Requested Specialty:   Urology    Number of Visits Requested:   1  . Ambulatory referral to Gastroenterology    Referral Priority:   Routine    Referral Type:   Consultation  Referral Reason:   Specialty Services Required    Number of Visits Requested:   1  . POCT urinalysis dip (device)  . EKG 12-Lead  . EKG    Ralph Pick. Tiburcio Pea, MSN, FNP-C [facility]  [address] [place], [place] [address] [phone]

## 2017-10-09 DIAGNOSIS — I10 Essential (primary) hypertension: Secondary | ICD-10-CM | POA: Diagnosis not present

## 2017-10-09 DIAGNOSIS — Z86010 Personal history of colonic polyps: Secondary | ICD-10-CM | POA: Diagnosis not present

## 2017-10-09 DIAGNOSIS — E032 Hypothyroidism due to medicaments and other exogenous substances: Secondary | ICD-10-CM | POA: Diagnosis not present

## 2017-10-09 DIAGNOSIS — E789 Disorder of lipoprotein metabolism, unspecified: Secondary | ICD-10-CM | POA: Diagnosis not present

## 2017-10-09 DIAGNOSIS — E118 Type 2 diabetes mellitus with unspecified complications: Secondary | ICD-10-CM | POA: Diagnosis not present

## 2017-10-09 DIAGNOSIS — Z79899 Other long term (current) drug therapy: Secondary | ICD-10-CM | POA: Diagnosis not present

## 2017-10-16 ENCOUNTER — Telehealth: Payer: Self-pay | Admitting: Family Medicine

## 2017-10-16 DIAGNOSIS — K8681 Exocrine pancreatic insufficiency: Secondary | ICD-10-CM | POA: Diagnosis not present

## 2017-10-16 DIAGNOSIS — E038 Other specified hypothyroidism: Secondary | ICD-10-CM | POA: Diagnosis not present

## 2017-10-16 DIAGNOSIS — R030 Elevated blood-pressure reading, without diagnosis of hypertension: Secondary | ICD-10-CM | POA: Diagnosis not present

## 2017-10-16 DIAGNOSIS — F39 Unspecified mood [affective] disorder: Secondary | ICD-10-CM | POA: Diagnosis not present

## 2017-10-16 DIAGNOSIS — E7849 Other hyperlipidemia: Secondary | ICD-10-CM | POA: Diagnosis not present

## 2017-10-16 DIAGNOSIS — E119 Type 2 diabetes mellitus without complications: Secondary | ICD-10-CM | POA: Diagnosis not present

## 2017-10-16 DIAGNOSIS — M1990 Unspecified osteoarthritis, unspecified site: Secondary | ICD-10-CM | POA: Diagnosis not present

## 2017-10-16 NOTE — Telephone Encounter (Signed)
Please contact [facility] and request copies of [place] 2 form and any other medical record for patient, list of any outside providers patient is followed by outside of the facility.    Godfrey PickKimberly S. Tiburcio PeaHarris, MSN, FNP-C [facility]  [address] [place], [place] [address] [phone]

## 2017-10-18 NOTE — Telephone Encounter (Signed)
Spoke with [facility] and was told to send a fax over with what information provider is requesting.

## 2017-10-27 DIAGNOSIS — R339 Retention of urine, unspecified: Secondary | ICD-10-CM | POA: Diagnosis not present

## 2017-10-27 DIAGNOSIS — N401 Enlarged prostate with lower urinary tract symptoms: Secondary | ICD-10-CM | POA: Diagnosis not present

## 2017-10-27 DIAGNOSIS — R972 Elevated prostate specific antigen [PSA]: Secondary | ICD-10-CM | POA: Diagnosis not present

## 2017-11-03 ENCOUNTER — Ambulatory Visit (INDEPENDENT_AMBULATORY_CARE_PROVIDER_SITE_OTHER): Payer: Medicare Other | Admitting: Family Medicine

## 2017-11-03 ENCOUNTER — Encounter: Payer: Self-pay | Admitting: Family Medicine

## 2017-11-03 VITALS — BP 142/80 | HR 94 | Temp 98.0°F | Resp 16 | Ht 72.0 in | Wt 190.6 lb

## 2017-11-03 DIAGNOSIS — E785 Hyperlipidemia, unspecified: Secondary | ICD-10-CM | POA: Diagnosis not present

## 2017-11-03 DIAGNOSIS — I10 Essential (primary) hypertension: Secondary | ICD-10-CM

## 2017-11-03 DIAGNOSIS — E538 Deficiency of other specified B group vitamins: Secondary | ICD-10-CM

## 2017-11-03 DIAGNOSIS — Z130 Encounter for screening for diseases of the blood and blood-forming organs and certain disorders involving the immune mechanism: Secondary | ICD-10-CM | POA: Diagnosis not present

## 2017-11-03 DIAGNOSIS — Z794 Long term (current) use of insulin: Secondary | ICD-10-CM | POA: Diagnosis not present

## 2017-11-03 DIAGNOSIS — E118 Type 2 diabetes mellitus with unspecified complications: Secondary | ICD-10-CM

## 2017-11-03 DIAGNOSIS — E039 Hypothyroidism, unspecified: Secondary | ICD-10-CM

## 2017-11-03 LAB — POCT URINALYSIS DIP (DEVICE)
Bilirubin Urine: NEGATIVE
Glucose, UA: NEGATIVE mg/dL
Ketones, ur: NEGATIVE mg/dL
LEUKOCYTES UA: NEGATIVE
NITRITE: NEGATIVE
PH: 5.5 (ref 5.0–8.0)
UROBILINOGEN UA: 0.2 mg/dL (ref 0.0–1.0)

## 2017-11-03 NOTE — Progress Notes (Signed)
Patient ID: Ralph [name], male    DOB: 1951/05/31, 67 y.o.   MRN: [id number]  PCP: Bing Neighbors, FNP  Chief Complaint  Patient presents with  . Follow-up    4 weeks on routine care    Subjective:  HPI Ralph [name] is a 67 y.o. male with hypertension, type 2 diabetes, BPH, cognitive impairment, hypothyroidism, presents for evaluation of hypertension and complains of elevated blood sugars.  Ralph [name] has brought a copy of his vital sign log from his facility [facility].  He is concerned that his blood sugars have been running high however readings have been consistently between 90 and 130.  His diabetes is managed by endocrinologist, Dr.Averheni. His blood pressure readings have been less than 140/90 at the facility however are elevated on arrival today.  He is uncertain as to whether or not he is taking his blood pressure medication this morning.  During last visit he was referred to urology for further evaluation and management of BPH. Reports urine frequency although denies dysuria. [facility] reports that [facility]  He denies any chest pain, shortness of breath, dizziness, weakness, neuropathic pain, visual disturbances, recent falls, or headache. Social History   Socioeconomic History  . Marital status: Married    Spouse name: Not on file  . Number of children: Not on file  . Years of education: Not on file  . Highest education level: Not on file  Social Needs  . Financial resource strain: Not on file  . Food insecurity - worry: Not on file  . Food insecurity - inability: Not on file  . Transportation needs - medical: Not on file  . Transportation needs - non-medical: Not on file  Occupational History  . Not on file  Tobacco Use  . Smoking status: Former Games developer  . Smokeless tobacco: Never Used  Substance and Sexual Activity  . Alcohol use: No  . Drug use: No  . Sexual activity: Not on file  Other Topics Concern  . Not on file  Social History  Narrative  . Not on file    Family History  Problem Relation Age of Onset  . Diabetes Mellitus II Mother   . Heart disease Father   . Lung cancer Father   . Diabetes Mellitus II Paternal Grandfather    Review of Systems Pertinent negatives listed in HPI Patient Active Problem List   Diagnosis Date Noted  . Malnutrition of moderate degree 11/26/2016  . Septic arthritis of foot (HCC) 11/26/2016  . Open wound of left foot 11/25/2016  . Thyroid disease   . Hypertension   . Diabetes mellitus without complication (HCC)   . Depression   . BPH (benign prostatic hyperplasia)   . Anxiety     Allergies  Allergen Reactions  . Antihistamines, Chlorpheniramine-Type Other (See Comments)    Per MAR  . Antihistamines, Diphenhydramine-Type Other (See Comments)    Per MAR  . Antihistamines, Loratadine-Type Other (See Comments)    Per MAR  . Benadryl [Diphenhydramine Hcl] Other (See Comments)  . Ethanolamine Other (See Comments)    Per MAR    Prior to Admission medications   Medication Sig Start Date End Date Taking? Authorizing Provider  acetaminophen (TYLENOL) 325 MG tablet Take 650 mg by mouth every 6 (six) hours as needed for moderate pain.     [provider]  ampicillin (PRINCIPEN) 500 MG capsule Take 1 capsule (500 mg total) by mouth 3 (three) times daily. 11/30/16  Mikhail, Nita Sells, DO  Amylase-Lipase-Protease (CREON 5 PO) Take by mouth.    [provider]  aspirin EC 81 MG tablet Take 81 mg by mouth daily.    [provider]  buPROPion (WELLBUTRIN XL) 300 MG 24 hr tablet Take 300 mg by mouth daily.    [provider]  canagliflozin (INVOKANA) 300 MG TABS tablet Take 300 mg by mouth daily before breakfast.    [provider]  clonazePAM (KLONOPIN) 0.5 MG tablet Take 0.5 mg by mouth 2 (two) times daily.    [provider]  divalproex (DEPAKOTE ER) 500 MG 24 hr tablet Take 1,000 mg by mouth every morning.    [provider]  doxycycline (VIBRAMYCIN) 100 MG capsule Take 1 capsule (100 mg total) by mouth 2 (two) times daily. 11/30/16   Mikhail, Nita Sells, DO  empagliflozin (JARDIANCE) 10 MG TABS tablet Take 10 mg by mouth daily.    [provider]  feeding supplement, GLUCERNA SHAKE, (GLUCERNA SHAKE) LIQD Take 237 mLs by mouth 3 (three) times daily between meals. 11/30/16   Edsel Petrin, DO  HYDROcodone-acetaminophen (NORCO/VICODIN) 5-325 MG tablet Take 1 tablet by mouth every 4 (four) hours as needed for moderate pain. 11/30/16   Mikhail, Nita Sells, DO  insulin detemir (LEVEMIR) 100 UNIT/ML injection Inject 26 Units into the skin every morning.    [provider]  Lactobacillus Casei-Folic Acid (RESTORA RX) 60-1.25 MG CAPS Take 1 capsule by mouth daily.    [provider]  levocetirizine (XYZAL) 5 MG tablet Take 5 mg by mouth every morning.    [provider]  levothyroxine (SYNTHROID, LEVOTHROID) 200 MCG tablet Take 200 mcg by mouth daily before breakfast.    [provider]  linaclotide (LINZESS) 145 MCG CAPS capsule Take 145 mcg by mouth daily before breakfast.    [provider]  liraglutide (VICTOZA) 18 MG/3ML SOPN Inject 1.8 mg into the skin every morning.    [provider]  meloxicam (MOBIC) 7.5 MG tablet Take 7.5 mg by mouth daily.    [provider]  metFORMIN (GLUCOPHAGE-XR) 500 MG 24 hr tablet Take 1,000 mg by mouth 2 (two) times daily.    [provider]  Multiple Vitamin (MULTIVITAMIN WITH MINERALS) TABS tablet Take 1 tablet by mouth daily.    [provider]  Multiple Vitamins-Minerals (CEROVITE SENIOR) TABS Take by mouth.    [provider]  Pancrelipase, Lip-Prot-Amyl, (CREON) 24000-76000 units CPEP Take 3 capsules by mouth 3 (three) times daily with meals. Along with 120000 units of amylase-included in capsules    [provider]  Polyethyl Glycol-Propyl Glycol (SYSTANE) 0.4-0.3 % SOLN Apply 1 drop  to eye 4 (four) times daily.    [provider]  polyethylene glycol (MIRALAX / GLYCOLAX) packet Take 17 g by mouth daily as needed for mild constipation. 11/30/16   Mikhail, Nita Sells, DO  pregabalin (LYRICA) 75 MG capsule Take 75 mg by mouth 3 (three) times daily.    [provider]  Probiotic Product (RESTORA PO) Take by mouth.    [provider]  ramipril (ALTACE) 5 MG capsule Take 5 mg by mouth daily.    [provider]  ranitidine (ZANTAC) 150 MG tablet Take 150 mg by mouth 2 (two) times daily.    [provider]  simvastatin (ZOCOR) 40 MG tablet Take 40 mg by mouth at bedtime.    [provider]  tamsulosin (FLOMAX) 0.4 MG CAPS capsule Take 0.4 mg by mouth daily  after breakfast.    [provider]  tolterodine (DETROL LA) 4 MG 24 hr capsule Take 4 mg by mouth daily.    [provider]  traMADol (ULTRAM) 50 MG tablet Take by mouth every 6 (six) hours as needed.    [provider]  White Petrolatum-Mineral Oil (SYSTANE NIGHTTIME) OINT Place 1 application into both eyes at bedtime.    [provider]    Past Medical, Surgical Family and Social History reviewed and updated.    Objective:   Today's Vitals   11/03/17 0952 11/03/17 1120  BP: (!) 144/78 (!) 142/80  Pulse: 94   Resp: 16   Temp: 98 F (36.7 C)   TempSrc: Oral   SpO2: 100%   Weight: 190 lb 9.6 oz (86.5 kg)   Height: 6' (1.829 m)    Wt Readings from Last 3 Encounters:  11/03/17 190 lb 9.6 oz (86.5 kg)  10/06/17 185 lb (83.9 kg)  03/08/17 183 lb (83 kg)    Physical Exam Constitutional: Patient appears well-developed and well-nourished. No distress. HENT: Normocephalic, atraumatic, External right and left ear normal. Oropharynx is clear and moist.  Eyes: Conjunctivae and EOM are normal. PERRLA, no scleral icterus. Neck: Normal ROM. Neck supple. No JVD. No tracheal deviation. No thyromegaly. CVS: RRR, S1/S2 +, no murmurs, no  gallops, no carotid bruit.  Pulmonary: Effort and breath sounds normal, no stridor, rhonchi, wheezes, rales.  Abdominal: Soft. BS +, no distension, tenderness, rebound or guarding.  Musculoskeletal: Normal range of motion. No edema and no tenderness.  Lymphadenopathy: No lymphadenopathy noted, cervical. Neuro: Alert. Normal reflexes, muscle tone coordination. No cranial nerve deficit. Skin: Skin is warm and dry. No rash noted. Not diaphoretic. No erythema. No pallor. Psychiatric: Normal mood and affect. Behavior, judgment, thought impaired,  impaired cognition.  Assessment & Plan:  1. Essential hypertension, stable-slightly above goal today. We have discussed target BP range and blood pressure goal. I have advised patient to check BP regularly and to call us back or report to clinic if the numbers are consistently higher than 140/90. We discussed the importance of compliance with medical therapy and DASH diet recommended, consequences of uncontrolled hypertension discussed. Will check comprehensive metabolic panel today. BP readings at facility are at goal.   2. Hyperlipidemia, unspecified hyperlipidemia type, continue statin therapy. Will check LFT with Comprehensive metabolic panel.  3. Type 2 diabetes mellitus with complication, with long-term current use of insulin (HCC), fasting blood sugar readings are within 90-130 at facility. No significant hyperglycemia noted . No changes to current regimen of Metformin and Jardiance. Continue physical activity as tolerated.  4. Screening for deficiency anemia,  CBC with Differential and Iron, TIBC and Ferritin Panel  5. Hypothyroidism, unspecified type, currently taking levothyroxine 200 mcg. Will check  Thyroid Panel With TSH.  6. B12 deficiency, check Vitamin B-12, evaluate for contributing factor of impaired cognitive status.  Patient is unoccupied at visit today therefore history and medication reconciliation was difficult to ascertain.     Orders Placed This Encounter  Procedures  . Thyroid Panel With TSH  . CBC with Differential  . Iron, TIBC and Ferritin Panel  . Comprehensive metabolic panel  . Vitamin B12  . POCT urinalysis dip (device)     Godfrey PickKimberly S. Tiburcio PeaHarris, MSN, FNP-C The Patient Care [facility]  [address] [place], [place] [address] [phone]

## 2017-11-03 NOTE — Patient Instructions (Addendum)
No changes to current medication regimen today.   You will be notified of any abnormal lab results.  Continue follow-up with endocrinologist.  Request that your urologist forward office visit notes to this office.    Diabetes Mellitus and Nutrition When you have diabetes (diabetes mellitus), it is very important to have healthy eating habits because your blood sugar (glucose) levels are greatly affected by what you eat and drink. Eating healthy foods in the appropriate amounts, at about the same times every day, can help you:  Control your blood glucose.  Lower your risk of heart disease.  Improve your blood pressure.  Reach or maintain a healthy weight.  Every person with diabetes is different, and each person has different needs for a meal plan. Your health care provider may recommend that you work with a diet and nutrition specialist (dietitian) to make a meal plan that is best for you. Your meal plan may vary depending on factors such as:  The calories you need.  The medicines you take.  Your weight.  Your blood glucose, blood pressure, and cholesterol levels.  Your activity level.  Other health conditions you have, such as heart or kidney disease.  How do carbohydrates affect me? Carbohydrates affect your blood glucose level more than any other type of food. Eating carbohydrates naturally increases the amount of glucose in your blood. Carbohydrate counting is a method for keeping track of how many carbohydrates you eat. Counting carbohydrates is important to keep your blood glucose at a healthy level, especially if you use insulin or take certain oral diabetes medicines. It is important to know how many carbohydrates you can safely have in each meal. This is different for every person. Your dietitian can help you calculate how many carbohydrates you should have at each meal and for snack. Foods that contain carbohydrates include:  Bread, cereal, rice, pasta, and  crackers.  Potatoes and corn.  Peas, beans, and lentils.  Milk and yogurt.  Fruit and juice.  Desserts, such as cakes, cookies, ice cream, and candy.  How does alcohol affect me? Alcohol can cause a sudden decrease in blood glucose (hypoglycemia), especially if you use insulin or take certain oral diabetes medicines. Hypoglycemia can be a life-threatening condition. Symptoms of hypoglycemia (sleepiness, dizziness, and confusion) are similar to symptoms of having too much alcohol. If your health care provider says that alcohol is safe for you, follow these guidelines:  Limit alcohol intake to no more than 1 drink per day for nonpregnant women and 2 drinks per day for men. One drink equals 12 oz of beer, 5 oz of wine, or 1 oz of hard liquor.  Do not drink on an empty stomach.  Keep yourself hydrated with water, diet soda, or unsweetened iced tea.  Keep in mind that regular soda, juice, and other mixers may contain a lot of sugar and must be counted as carbohydrates.  What are tips for following this plan? Reading food labels  Start by checking the serving size on the label. The amount of calories, carbohydrates, fats, and other nutrients listed on the label are based on one serving of the food. Many foods contain more than one serving per package.  Check the total grams (g) of carbohydrates in one serving. You can calculate the number of servings of carbohydrates in one serving by dividing the total carbohydrates by 15. For example, if a food has 30 g of total carbohydrates, it would be equal to 2 servings of carbohydrates.  Check  the number of grams (g) of saturated and trans fats in one serving. Choose foods that have low or no amount of these fats.  Check the number of milligrams (mg) of sodium in one serving. Most people should limit total sodium intake to less than 2,300 mg per day.  Always check the nutrition information of foods labeled as "low-fat" or "nonfat". These foods  may be higher in added sugar or refined carbohydrates and should be avoided.  Talk to your dietitian to identify your daily goals for nutrients listed on the label. Shopping  Avoid buying canned, premade, or processed foods. These foods tend to be high in fat, sodium, and added sugar.  Shop around the outside edge of the grocery store. This includes fresh fruits and vegetables, bulk grains, fresh meats, and fresh dairy. Cooking  Use low-heat cooking methods, such as baking, instead of high-heat cooking methods like deep frying.  Cook using healthy oils, such as olive, canola, or sunflower oil.  Avoid cooking with butter, cream, or high-fat meats. Meal planning  Eat meals and snacks regularly, preferably at the same times every day. Avoid going long periods of time without eating.  Eat foods high in fiber, such as fresh fruits, vegetables, beans, and whole grains. Talk to your dietitian about how many servings of carbohydrates you can eat at each meal.  Eat 4-6 ounces of lean protein each day, such as lean meat, chicken, fish, eggs, or tofu. 1 ounce is equal to 1 ounce of meat, chicken, or fish, 1 egg, or 1/4 cup of tofu.  Eat some foods each day that contain healthy fats, such as avocado, nuts, seeds, and fish. Lifestyle   Check your blood glucose regularly.  Exercise at least 30 minutes 5 or more days each week, or as told by your health care provider.  Take medicines as told by your health care provider.  Do not use any products that contain nicotine or tobacco, such as cigarettes and e-cigarettes. If you need help quitting, ask your health care provider.  Work with a Veterinary surgeoncounselor or diabetes educator to identify strategies to manage stress and any emotional and social challenges. What are some questions to ask my health care provider?  Do I need to meet with a diabetes educator?  Do I need to meet with a dietitian?  What number can I call if I have questions?  When are the  best times to check my blood glucose? Where to find more information:  American Diabetes Association: diabetes.org/food-and-fitness/food  Academy of Nutrition and Dietetics: https://www.vargas.com/www.eatright.org/resources/health/diseases-and-conditions/diabetes  [employer] Institute of Diabetes and Digestive and Kidney Diseases (NIH): FindJewelers.czwww.niddk.nih.gov/health-information/diabetes/overview/diet-eating-physical-activity Summary  A healthy meal plan will help you control your blood glucose and maintain a healthy lifestyle.  Working with a diet and nutrition specialist (dietitian) can help you make a meal plan that is best for you.  Keep in mind that carbohydrates and alcohol have immediate effects on your blood glucose levels. It is important to count carbohydrates and to use alcohol carefully. This information is not intended to replace advice given to you by your health care provider. Make sure you discuss any questions you have with your health care provider. Document Released: 05/12/2005 Document Revised: 09/19/2016 Document Reviewed: 09/19/2016 Elsevier Interactive Patient Education  [employer]2018 Elsevier Inc.

## 2017-11-04 LAB — COMPREHENSIVE METABOLIC PANEL
ALBUMIN: 3.8 g/dL (ref 3.6–4.8)
ALK PHOS: 56 IU/L (ref 39–117)
ALT: 14 IU/L (ref 0–44)
AST: 15 IU/L (ref 0–40)
Albumin/Globulin Ratio: 1.7 (ref 1.2–2.2)
BILIRUBIN TOTAL: 0.3 mg/dL (ref 0.0–1.2)
BUN / CREAT RATIO: 20 (ref 10–24)
BUN: 16 mg/dL (ref 8–27)
CHLORIDE: 103 mmol/L (ref 96–106)
CO2: 25 mmol/L (ref 20–29)
CREATININE: 0.82 mg/dL (ref 0.76–1.27)
Calcium: 9.2 mg/dL (ref 8.6–10.2)
GFR calc Af Amer: 107 mL/min/{1.73_m2} (ref >59)
GFR calc non Af Amer: 92 mL/min/{1.73_m2} (ref >59)
GLOBULIN, TOTAL: 2.2 g/dL (ref 1.5–4.5)
GLUCOSE: 132 mg/dL — AB (ref 65–99)
Potassium: 4.4 mmol/L (ref 3.5–5.2)
SODIUM: 142 mmol/L (ref 134–144)
Total Protein: 6 g/dL (ref 6.0–8.5)

## 2017-11-04 LAB — CBC WITH DIFFERENTIAL/PLATELET
BASOS: 0 %
Basophils Absolute: 0 10*3/uL (ref 0.0–0.2)
EOS (ABSOLUTE): 0.1 10*3/uL (ref 0.0–0.4)
EOS: 2 %
HEMATOCRIT: 37.6 % (ref 37.5–51.0)
Hemoglobin: 12.5 g/dL — ABNORMAL LOW (ref 13.0–17.7)
IMMATURE GRANS (ABS): 0 10*3/uL (ref 0.0–0.1)
IMMATURE GRANULOCYTES: 0 %
LYMPHS: 28 %
Lymphocytes Absolute: 1.7 10*3/uL (ref 0.7–3.1)
MCH: 29.9 pg (ref 26.6–33.0)
MCHC: 33.2 g/dL (ref 31.5–35.7)
MCV: 90 fL (ref 79–97)
Monocytes Absolute: 0.5 10*3/uL (ref 0.1–0.9)
Monocytes: 9 %
NEUTROS PCT: 61 %
Neutrophils Absolute: 3.8 10*3/uL (ref 1.4–7.0)
Platelets: 263 10*3/uL (ref 150–379)
RBC: 4.18 x10E6/uL (ref 4.14–5.80)
RDW: 13.8 % (ref 12.3–15.4)
WBC: 6.2 10*3/uL (ref 3.4–10.8)

## 2017-11-04 LAB — IRON,TIBC AND FERRITIN PANEL
Ferritin: 232 ng/mL (ref 30–400)
IRON SATURATION: 23 % (ref 15–55)
IRON: 61 ug/dL (ref 38–169)
Total Iron Binding Capacity: 268 ug/dL (ref 250–450)
UIBC: 207 ug/dL (ref 111–343)

## 2017-11-04 LAB — VITAMIN B12: VITAMIN B 12: 596 pg/mL (ref 232–1245)

## 2017-11-04 LAB — THYROID PANEL WITH TSH
Free Thyroxine Index: 3.2 (ref 1.2–4.9)
T3 Uptake Ratio: 34 % (ref 24–39)
T4, Total: 9.5 ug/dL (ref 4.5–12.0)
TSH: 0.058 u[IU]/mL — ABNORMAL LOW (ref 0.450–4.500)

## 2017-11-05 ENCOUNTER — Telehealth: Payer: Self-pay | Admitting: Family Medicine

## 2017-11-05 NOTE — Telephone Encounter (Signed)
-  results and medication changes should be communicated to a staff member at  patient's facility   Recent labs indicate abnormal decrease in TSH.  I would like for patient to reduce levothyroxine 100 mcg in morning once daily. Return in 6 weeks for repeat TSH. All others labs were within normal range.   Godfrey PickKimberly S. Tiburcio PeaHarris, MSN, FNP-C The Patient Care [facility]  [address] [place], [place] [address] [phone]

## 2017-11-06 ENCOUNTER — Telehealth: Payer: Self-pay

## 2017-11-06 NOTE — Telephone Encounter (Signed)
Contact [facility] to advise them to inquire about diabetes managenment by contacting  Dr. Marlene LardMadhavi Averneni, patient's endocrinologist  Phone: [phone] at [facility],  patient's endocrinologist that is managing his diabetes. He reported that he has been seen at practice within the last 2 months.  Ralph PickKimberly S. Tiburcio PeaHarris, MSN, FNP-C The Patient Care [facility]  [address] [place], [place] [address] [phone]

## 2017-11-06 NOTE — Telephone Encounter (Signed)
Spoke with Clydie BraunKaren at [facility] and faxed results to her on patient per her request.

## 2017-11-06 NOTE — Telephone Encounter (Signed)
Hazel from [facility] states that patient hasn't been on Santa RitaJardiance or Invokana since he has been at the facility. Please advise

## 2017-11-07 NOTE — Telephone Encounter (Signed)
Left a vm for Hazel to call patient regarding patient

## 2017-11-09 NOTE — Telephone Encounter (Signed)
[facility] regarding message below.

## 2017-11-22 ENCOUNTER — Ambulatory Visit (AMBULATORY_SURGERY_CENTER): Payer: Self-pay

## 2017-11-22 VITALS — Ht 72.0 in | Wt 187.0 lb

## 2017-11-22 DIAGNOSIS — Z1211 Encounter for screening for malignant neoplasm of colon: Secondary | ICD-10-CM

## 2017-11-22 NOTE — Progress Notes (Signed)
Per pt, no allergies to soy or egg products.Pt not taking any weight loss meds or using  O2 at home.  Pt refused emmi video.  Pt came into the office for his PV prior to his colon on 12/05/17. [facility] and was able to answer most of his medical questions well,but his sister has POA, per pt. Pt was informed his sister needs to be with him for the PV and his colonoscopy appointment and to sign paperwork. Chrissie NoaWilliam, the transporter was trying to contact the facility to reschedule, but unable to at this time.  Will have the sister call to reschedule PV and colon appt. The pt needs to have a 60 minute PV.  Scheduling was notified!  PV today was not completed and colon on 12/05/17 was cancelled. Chrissie NoaWilliam, the transporter was aware of the colon being cx'd, but took Suprep sample with him-Lot #[id number], Exp 02/21.Cherylann RatelJanis

## 2017-12-05 ENCOUNTER — Encounter: Payer: Medicare Other | Admitting: Gastroenterology

## 2017-12-08 IMAGING — DX DG FOOT COMPLETE 3+V*L*
3 series · 3 of 3 positions shown · non-contrast
Comparison: 11/22/2016

CLINICAL DATA: Ulcer on lateral side of LEFT foot near little toe
excreting pus question osteomyelitis

EXAM:
LEFT FOOT - COMPLETE 3+ VIEW

[foot ap]
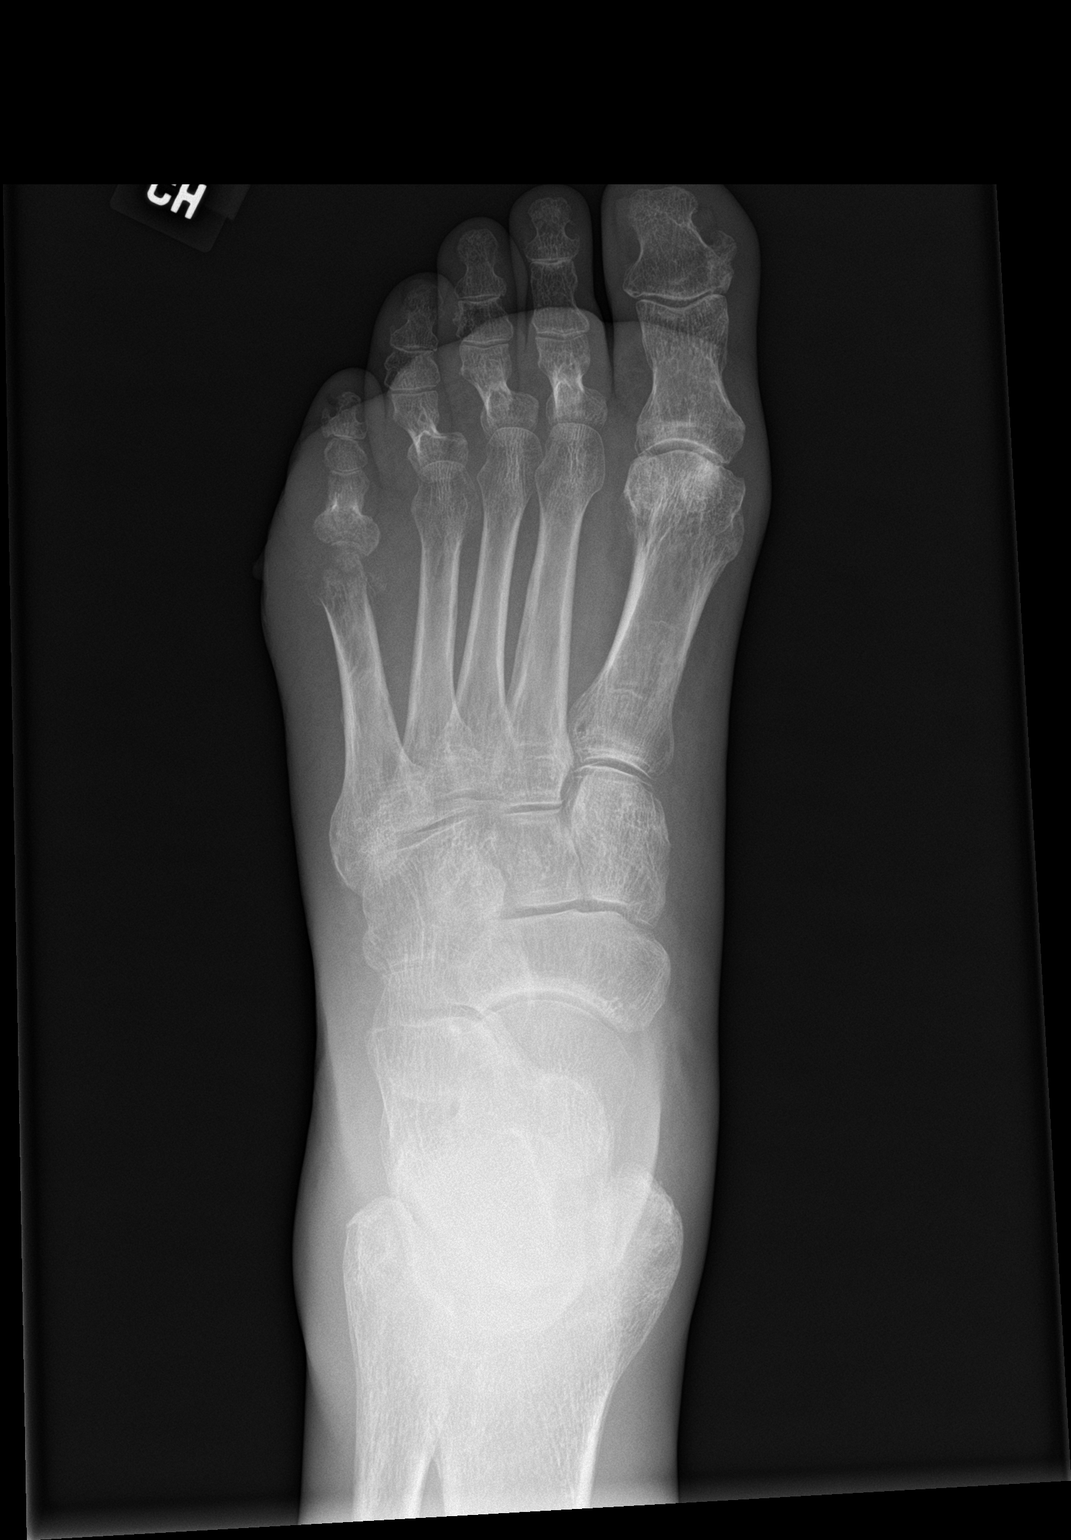

[foot obl]
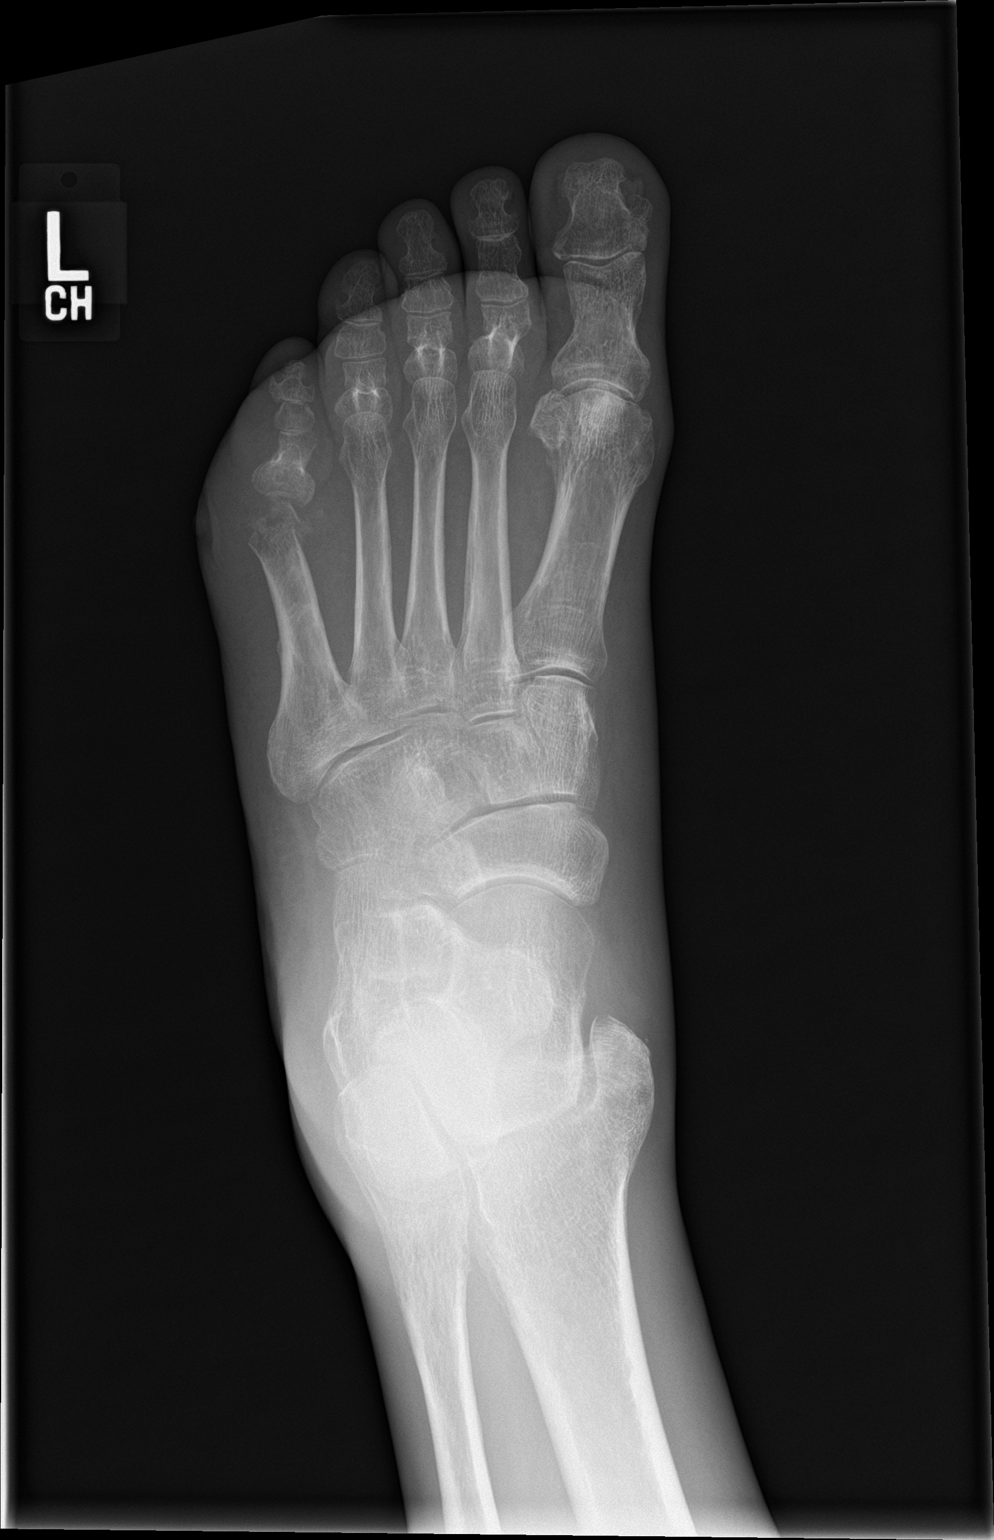

[foot lat]
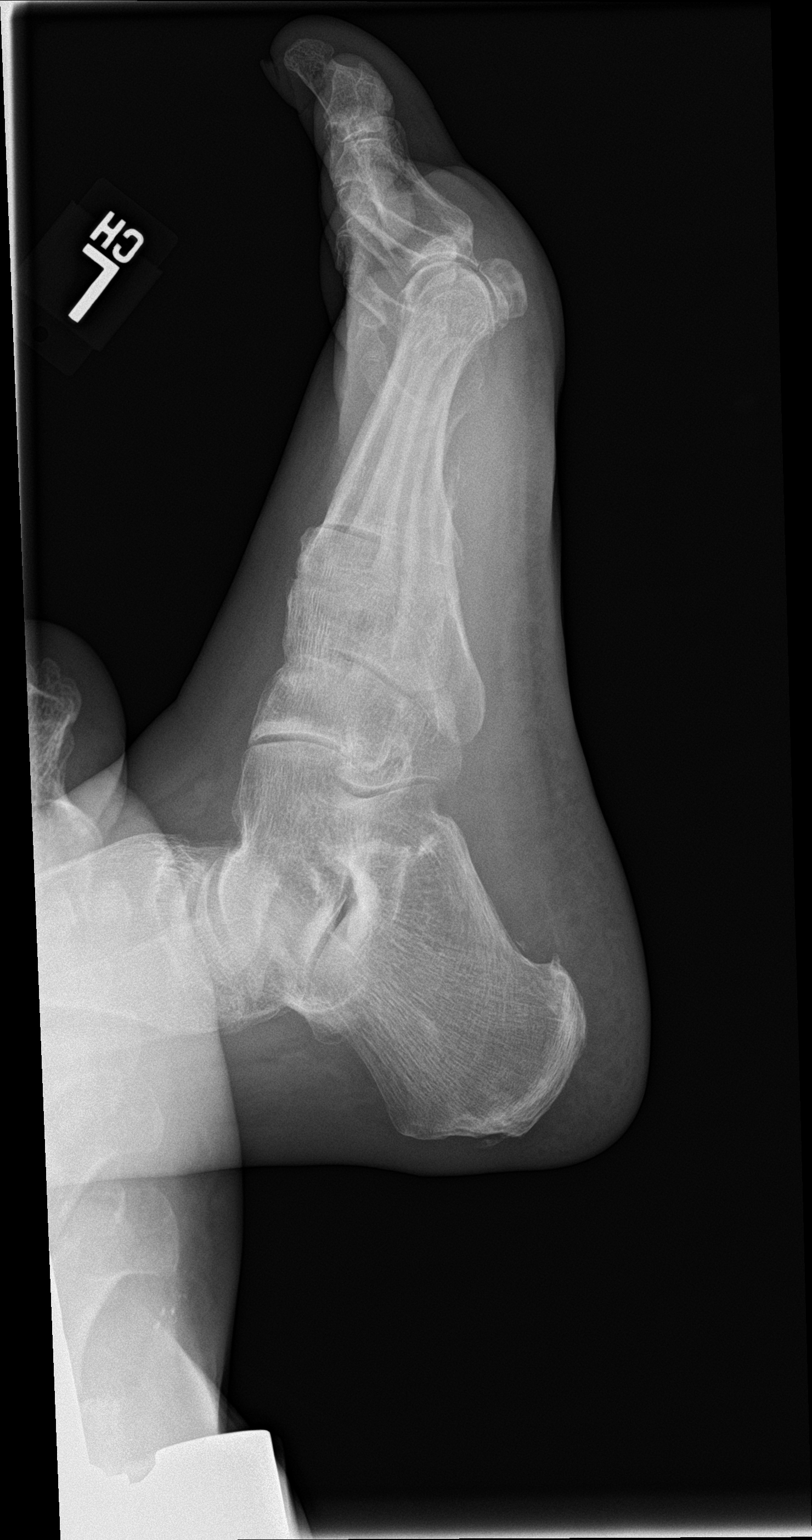

[3 of 3 positions shown; findings below may reference images not displayed]

FINDINGS: Osseous demineralization.

Bone destruction at the head of the fifth metatarsal consistent with
osteomyelitis.

Additional bone destruction at the lateral base of the proximal
phalanx of the fifth toe compatible with osteomyelitis and
involvement of the fifth MTP joint compatible with septic arthritis.

Overlying soft tissue swelling.

No additional fracture, dislocation or bone destruction.

Bone destruction at the fifth metatarsal head is progressive since
the previous exam, as is the destruction at the base of the proximal
phalanx little toe.

Tiny plantar calcaneal spur.
IMPRESSION: Septic arthritis of the fifth MTP joint with osteomyelitis of the
base of the proximal phalanx little toe and more prominently of the
distal LEFT fifth metatarsal.

Bone destruction is mildly progressive since 11/22/2016.

## 2018-01-03 ENCOUNTER — Encounter: Payer: Self-pay | Admitting: Family Medicine

## 2018-01-03 DIAGNOSIS — N401 Enlarged prostate with lower urinary tract symptoms: Secondary | ICD-10-CM | POA: Diagnosis not present

## 2018-01-03 DIAGNOSIS — R350 Frequency of micturition: Secondary | ICD-10-CM | POA: Diagnosis not present

## 2018-01-03 DIAGNOSIS — R972 Elevated prostate specific antigen [PSA]: Secondary | ICD-10-CM | POA: Diagnosis not present

## 2018-01-16 DIAGNOSIS — E1165 Type 2 diabetes mellitus with hyperglycemia: Secondary | ICD-10-CM | POA: Diagnosis not present

## 2018-01-16 DIAGNOSIS — Z794 Long term (current) use of insulin: Secondary | ICD-10-CM | POA: Diagnosis not present

## 2018-01-16 DIAGNOSIS — I10 Essential (primary) hypertension: Secondary | ICD-10-CM | POA: Diagnosis not present

## 2018-01-16 DIAGNOSIS — F329 Major depressive disorder, single episode, unspecified: Secondary | ICD-10-CM | POA: Diagnosis not present

## 2018-01-16 DIAGNOSIS — F209 Schizophrenia, unspecified: Secondary | ICD-10-CM | POA: Diagnosis not present

## 2018-01-16 DIAGNOSIS — Z7982 Long term (current) use of aspirin: Secondary | ICD-10-CM | POA: Diagnosis not present

## 2018-01-23 DIAGNOSIS — Z7982 Long term (current) use of aspirin: Secondary | ICD-10-CM | POA: Diagnosis not present

## 2018-01-23 DIAGNOSIS — E1165 Type 2 diabetes mellitus with hyperglycemia: Secondary | ICD-10-CM | POA: Diagnosis not present

## 2018-01-23 DIAGNOSIS — Z794 Long term (current) use of insulin: Secondary | ICD-10-CM | POA: Diagnosis not present

## 2018-01-23 DIAGNOSIS — F209 Schizophrenia, unspecified: Secondary | ICD-10-CM | POA: Diagnosis not present

## 2018-01-23 DIAGNOSIS — F329 Major depressive disorder, single episode, unspecified: Secondary | ICD-10-CM | POA: Diagnosis not present

## 2018-01-23 DIAGNOSIS — I10 Essential (primary) hypertension: Secondary | ICD-10-CM | POA: Diagnosis not present

## 2018-01-30 DIAGNOSIS — F209 Schizophrenia, unspecified: Secondary | ICD-10-CM | POA: Diagnosis not present

## 2018-01-30 DIAGNOSIS — I10 Essential (primary) hypertension: Secondary | ICD-10-CM | POA: Diagnosis not present

## 2018-01-30 DIAGNOSIS — E1165 Type 2 diabetes mellitus with hyperglycemia: Secondary | ICD-10-CM | POA: Diagnosis not present

## 2018-01-30 DIAGNOSIS — Z794 Long term (current) use of insulin: Secondary | ICD-10-CM | POA: Diagnosis not present

## 2018-01-30 DIAGNOSIS — F329 Major depressive disorder, single episode, unspecified: Secondary | ICD-10-CM | POA: Diagnosis not present

## 2018-01-30 DIAGNOSIS — Z7982 Long term (current) use of aspirin: Secondary | ICD-10-CM | POA: Diagnosis not present

## 2018-02-06 DIAGNOSIS — F209 Schizophrenia, unspecified: Secondary | ICD-10-CM | POA: Diagnosis not present

## 2018-02-06 DIAGNOSIS — Z794 Long term (current) use of insulin: Secondary | ICD-10-CM | POA: Diagnosis not present

## 2018-02-06 DIAGNOSIS — F329 Major depressive disorder, single episode, unspecified: Secondary | ICD-10-CM | POA: Diagnosis not present

## 2018-02-06 DIAGNOSIS — I10 Essential (primary) hypertension: Secondary | ICD-10-CM | POA: Diagnosis not present

## 2018-02-06 DIAGNOSIS — Z7982 Long term (current) use of aspirin: Secondary | ICD-10-CM | POA: Diagnosis not present

## 2018-02-06 DIAGNOSIS — E1165 Type 2 diabetes mellitus with hyperglycemia: Secondary | ICD-10-CM | POA: Diagnosis not present

## 2018-02-13 DIAGNOSIS — F329 Major depressive disorder, single episode, unspecified: Secondary | ICD-10-CM | POA: Diagnosis not present

## 2018-02-13 DIAGNOSIS — E1165 Type 2 diabetes mellitus with hyperglycemia: Secondary | ICD-10-CM | POA: Diagnosis not present

## 2018-02-13 DIAGNOSIS — Z7982 Long term (current) use of aspirin: Secondary | ICD-10-CM | POA: Diagnosis not present

## 2018-02-13 DIAGNOSIS — Z794 Long term (current) use of insulin: Secondary | ICD-10-CM | POA: Diagnosis not present

## 2018-02-13 DIAGNOSIS — I10 Essential (primary) hypertension: Secondary | ICD-10-CM | POA: Diagnosis not present

## 2018-02-13 DIAGNOSIS — F209 Schizophrenia, unspecified: Secondary | ICD-10-CM | POA: Diagnosis not present

## 2018-03-09 ENCOUNTER — Telehealth: Payer: Self-pay | Admitting: Family Medicine

## 2018-03-09 NOTE — Telephone Encounter (Signed)
Ms. Ralph [name] called to request that a care plan, diet and standing orders be completed for the pt; requests that information faxed to this office be completed and returned to her as soon as possible

## 2018-03-12 NOTE — Telephone Encounter (Signed)
Spoke to Ralph [name] last week requesting that she send care plan to our office for review and sign off. She stated that she will send care plan for my signature in the near future.

## 2018-03-26 DIAGNOSIS — N401 Enlarged prostate with lower urinary tract symptoms: Secondary | ICD-10-CM | POA: Diagnosis not present

## 2018-03-26 DIAGNOSIS — R3912 Poor urinary stream: Secondary | ICD-10-CM | POA: Diagnosis not present

## 2018-03-27 DIAGNOSIS — N401 Enlarged prostate with lower urinary tract symptoms: Secondary | ICD-10-CM | POA: Diagnosis not present

## 2018-03-27 DIAGNOSIS — E039 Hypothyroidism, unspecified: Secondary | ICD-10-CM | POA: Diagnosis not present

## 2018-03-27 DIAGNOSIS — E119 Type 2 diabetes mellitus without complications: Secondary | ICD-10-CM | POA: Diagnosis not present

## 2018-03-27 DIAGNOSIS — R3912 Poor urinary stream: Secondary | ICD-10-CM | POA: Diagnosis not present

## 2018-04-17 DIAGNOSIS — Z6825 Body mass index (BMI) 25.0-25.9, adult: Secondary | ICD-10-CM | POA: Diagnosis not present

## 2018-04-17 DIAGNOSIS — G629 Polyneuropathy, unspecified: Secondary | ICD-10-CM | POA: Diagnosis not present

## 2018-04-17 DIAGNOSIS — E039 Hypothyroidism, unspecified: Secondary | ICD-10-CM | POA: Diagnosis not present

## 2018-04-17 DIAGNOSIS — E118 Type 2 diabetes mellitus with unspecified complications: Secondary | ICD-10-CM | POA: Diagnosis not present

## 2018-04-17 DIAGNOSIS — I10 Essential (primary) hypertension: Secondary | ICD-10-CM | POA: Diagnosis not present

## 2018-05-07 DIAGNOSIS — F411 Generalized anxiety disorder: Secondary | ICD-10-CM | POA: Diagnosis not present

## 2018-05-07 DIAGNOSIS — F200 Paranoid schizophrenia: Secondary | ICD-10-CM | POA: Diagnosis not present

## 2018-05-08 ENCOUNTER — Ambulatory Visit: Payer: Medicare Other | Admitting: Family Medicine

## 2018-05-09 DIAGNOSIS — R310 Gross hematuria: Secondary | ICD-10-CM | POA: Diagnosis not present

## 2018-05-09 DIAGNOSIS — R3914 Feeling of incomplete bladder emptying: Secondary | ICD-10-CM | POA: Diagnosis not present

## 2018-05-09 DIAGNOSIS — N401 Enlarged prostate with lower urinary tract symptoms: Secondary | ICD-10-CM | POA: Diagnosis not present

## 2018-05-16 DIAGNOSIS — E782 Mixed hyperlipidemia: Secondary | ICD-10-CM | POA: Diagnosis not present

## 2018-05-16 DIAGNOSIS — F411 Generalized anxiety disorder: Secondary | ICD-10-CM | POA: Diagnosis not present

## 2018-05-16 DIAGNOSIS — F200 Paranoid schizophrenia: Secondary | ICD-10-CM | POA: Diagnosis not present

## 2018-05-16 DIAGNOSIS — Z5181 Encounter for therapeutic drug level monitoring: Secondary | ICD-10-CM | POA: Diagnosis not present

## 2018-05-16 DIAGNOSIS — I10 Essential (primary) hypertension: Secondary | ICD-10-CM | POA: Diagnosis not present

## 2018-05-21 DIAGNOSIS — F250 Schizoaffective disorder, bipolar type: Secondary | ICD-10-CM | POA: Diagnosis not present

## 2018-05-21 DIAGNOSIS — F200 Paranoid schizophrenia: Secondary | ICD-10-CM | POA: Diagnosis not present

## 2018-05-21 DIAGNOSIS — F411 Generalized anxiety disorder: Secondary | ICD-10-CM | POA: Diagnosis not present

## 2018-05-22 DIAGNOSIS — E118 Type 2 diabetes mellitus with unspecified complications: Secondary | ICD-10-CM | POA: Diagnosis not present

## 2018-05-22 DIAGNOSIS — I10 Essential (primary) hypertension: Secondary | ICD-10-CM | POA: Diagnosis not present

## 2018-05-28 DIAGNOSIS — F250 Schizoaffective disorder, bipolar type: Secondary | ICD-10-CM | POA: Diagnosis not present

## 2018-06-04 DIAGNOSIS — F411 Generalized anxiety disorder: Secondary | ICD-10-CM | POA: Diagnosis not present

## 2018-06-04 DIAGNOSIS — F200 Paranoid schizophrenia: Secondary | ICD-10-CM | POA: Diagnosis not present

## 2018-06-18 DIAGNOSIS — F411 Generalized anxiety disorder: Secondary | ICD-10-CM | POA: Diagnosis not present

## 2018-06-18 DIAGNOSIS — F200 Paranoid schizophrenia: Secondary | ICD-10-CM | POA: Diagnosis not present

## 2018-07-02 DIAGNOSIS — F200 Paranoid schizophrenia: Secondary | ICD-10-CM | POA: Diagnosis not present

## 2018-07-02 DIAGNOSIS — F411 Generalized anxiety disorder: Secondary | ICD-10-CM | POA: Diagnosis not present

## 2018-07-16 DIAGNOSIS — E785 Hyperlipidemia, unspecified: Secondary | ICD-10-CM | POA: Diagnosis not present

## 2018-07-16 DIAGNOSIS — K219 Gastro-esophageal reflux disease without esophagitis: Secondary | ICD-10-CM | POA: Diagnosis not present

## 2018-07-16 DIAGNOSIS — H04123 Dry eye syndrome of bilateral lacrimal glands: Secondary | ICD-10-CM | POA: Diagnosis not present

## 2018-07-16 DIAGNOSIS — G894 Chronic pain syndrome: Secondary | ICD-10-CM | POA: Diagnosis not present

## 2018-07-16 DIAGNOSIS — E039 Hypothyroidism, unspecified: Secondary | ICD-10-CM | POA: Diagnosis not present

## 2018-07-16 DIAGNOSIS — I10 Essential (primary) hypertension: Secondary | ICD-10-CM | POA: Diagnosis not present

## 2018-07-16 DIAGNOSIS — E119 Type 2 diabetes mellitus without complications: Secondary | ICD-10-CM | POA: Diagnosis not present

## 2018-07-16 DIAGNOSIS — F411 Generalized anxiety disorder: Secondary | ICD-10-CM | POA: Diagnosis not present

## 2018-07-16 DIAGNOSIS — J302 Other seasonal allergic rhinitis: Secondary | ICD-10-CM | POA: Diagnosis not present

## 2018-07-16 DIAGNOSIS — F200 Paranoid schizophrenia: Secondary | ICD-10-CM | POA: Diagnosis not present

## 2018-07-16 DIAGNOSIS — Z7901 Long term (current) use of anticoagulants: Secondary | ICD-10-CM | POA: Diagnosis not present

## 2018-07-17 DIAGNOSIS — E118 Type 2 diabetes mellitus with unspecified complications: Secondary | ICD-10-CM | POA: Diagnosis not present

## 2018-07-17 DIAGNOSIS — I10 Essential (primary) hypertension: Secondary | ICD-10-CM | POA: Diagnosis not present

## 2018-07-17 DIAGNOSIS — G629 Polyneuropathy, unspecified: Secondary | ICD-10-CM | POA: Diagnosis not present

## 2018-07-17 DIAGNOSIS — E039 Hypothyroidism, unspecified: Secondary | ICD-10-CM | POA: Diagnosis not present

## 2018-07-17 DIAGNOSIS — Z6825 Body mass index (BMI) 25.0-25.9, adult: Secondary | ICD-10-CM | POA: Diagnosis not present

## 2018-07-24 DIAGNOSIS — Z1211 Encounter for screening for malignant neoplasm of colon: Secondary | ICD-10-CM | POA: Diagnosis not present

## 2018-07-30 DIAGNOSIS — F411 Generalized anxiety disorder: Secondary | ICD-10-CM | POA: Diagnosis not present

## 2018-07-30 DIAGNOSIS — F200 Paranoid schizophrenia: Secondary | ICD-10-CM | POA: Diagnosis not present

## 2018-08-13 DIAGNOSIS — F411 Generalized anxiety disorder: Secondary | ICD-10-CM | POA: Diagnosis not present

## 2018-08-13 DIAGNOSIS — F200 Paranoid schizophrenia: Secondary | ICD-10-CM | POA: Diagnosis not present

## 2018-08-20 DIAGNOSIS — H04123 Dry eye syndrome of bilateral lacrimal glands: Secondary | ICD-10-CM | POA: Diagnosis not present

## 2018-08-20 DIAGNOSIS — E039 Hypothyroidism, unspecified: Secondary | ICD-10-CM | POA: Diagnosis not present

## 2018-08-20 DIAGNOSIS — G894 Chronic pain syndrome: Secondary | ICD-10-CM | POA: Diagnosis not present

## 2018-08-20 DIAGNOSIS — E785 Hyperlipidemia, unspecified: Secondary | ICD-10-CM | POA: Diagnosis not present

## 2018-08-20 DIAGNOSIS — I10 Essential (primary) hypertension: Secondary | ICD-10-CM | POA: Diagnosis not present

## 2018-08-20 DIAGNOSIS — J302 Other seasonal allergic rhinitis: Secondary | ICD-10-CM | POA: Diagnosis not present

## 2018-08-20 DIAGNOSIS — K219 Gastro-esophageal reflux disease without esophagitis: Secondary | ICD-10-CM | POA: Diagnosis not present

## 2018-08-20 DIAGNOSIS — E119 Type 2 diabetes mellitus without complications: Secondary | ICD-10-CM | POA: Diagnosis not present

## 2018-08-20 DIAGNOSIS — Z7901 Long term (current) use of anticoagulants: Secondary | ICD-10-CM | POA: Diagnosis not present

## 2019-05-07 ENCOUNTER — Encounter (HOSPITAL_COMMUNITY): Payer: Self-pay | Admitting: Family Medicine

## 2019-05-07 ENCOUNTER — Emergency Department (HOSPITAL_COMMUNITY): Payer: Medicare Other

## 2019-05-07 ENCOUNTER — Other Ambulatory Visit: Payer: Self-pay

## 2019-05-07 ENCOUNTER — Emergency Department (HOSPITAL_COMMUNITY)
Admission: EM | Admit: 2019-05-07 | Discharge: 2019-05-08 | Disposition: A | Discharge status: Skilled Nursing Facility | Payer: Medicare Other | Attending: Emergency Medicine | Admitting: Emergency Medicine

## 2019-05-07 DIAGNOSIS — S20212A Contusion of left front wall of thorax, initial encounter: Secondary | ICD-10-CM | POA: Insufficient documentation

## 2019-05-07 DIAGNOSIS — Z7982 Long term (current) use of aspirin: Secondary | ICD-10-CM | POA: Diagnosis not present

## 2019-05-07 DIAGNOSIS — W010XXA Fall on same level from slipping, tripping and stumbling without subsequent striking against object, initial encounter: Secondary | ICD-10-CM | POA: Diagnosis not present

## 2019-05-07 DIAGNOSIS — I10 Essential (primary) hypertension: Secondary | ICD-10-CM | POA: Insufficient documentation

## 2019-05-07 DIAGNOSIS — Z87891 Personal history of nicotine dependence: Secondary | ICD-10-CM | POA: Insufficient documentation

## 2019-05-07 DIAGNOSIS — S0990XA Unspecified injury of head, initial encounter: Secondary | ICD-10-CM

## 2019-05-07 DIAGNOSIS — Z79899 Other long term (current) drug therapy: Secondary | ICD-10-CM | POA: Diagnosis not present

## 2019-05-07 DIAGNOSIS — Y999 Unspecified external cause status: Secondary | ICD-10-CM | POA: Insufficient documentation

## 2019-05-07 DIAGNOSIS — Y9289 Other specified places as the place of occurrence of the external cause: Secondary | ICD-10-CM | POA: Diagnosis not present

## 2019-05-07 DIAGNOSIS — Z7984 Long term (current) use of oral hypoglycemic drugs: Secondary | ICD-10-CM | POA: Insufficient documentation

## 2019-05-07 DIAGNOSIS — Y939 Activity, unspecified: Secondary | ICD-10-CM | POA: Diagnosis not present

## 2019-05-07 MED ORDER — LIDOCAINE 5 % EX PTCH: 1.0000 | MEDICATED_PATCH | CUTANEOUS | 0 refills | Status: DC

## 2019-05-07 MED ORDER — ACETAMINOPHEN 325 MG PO TABS
650.0000 mg | ORAL_TABLET | Freq: Once | ORAL | Status: AC
  Administered 2019-05-08: 650 mg via ORAL
  Filled 2019-05-07: qty 2

## 2019-05-07 MED ORDER — LIDOCAINE 5 % EX PTCH
1.0000 | MEDICATED_PATCH | CUTANEOUS | Status: DC
  Administered 2019-05-08: 1 via TRANSDERMAL
  Filled 2019-05-07: qty 1

## 2019-05-07 NOTE — Progress Notes (Addendum)
CSW received a call from pt's legal guardian [place] pt's legal guardian asking for updates.  CSW notified ED Secretary to update RN that p's legal guardian from [place] may be calling for updates.  Miss Weeks with DSS will fax a copy of pt's updated guardianship paperwork to the CSW at fax: [phone] (a secure fax). . Please reconsult if future social work needs arise.  CSW signing off, as social work intervention is no longer needed.  Alphonse Guild. Waverly Chavarria, LCSW, LCAS, CSI Transitions of Care Clinical Social Worker Care Coordination Department Ph: [phone]

## 2019-05-07 NOTE — ED Notes (Signed)
Patient states he fell at the facility. He thinks he pulled something on his left side. He states he hit his head. Patient has no bruises or lacerations.

## 2019-05-07 NOTE — ED Triage Notes (Signed)
Patient is from [place] and transported via [facility]. Patient was walking and tripped. Hit his head on the left side, but unknown surface. EMS denies LOC, no nausea, vomiting, or neck/back injuries. After reviewing the medication list, patient is on ASA.

## 2019-05-07 NOTE — Discharge Instructions (Signed)
It was my pleasure taking care of you today!   Lidoderm patches and Tylenol as needed for pain.  Follow up with your primary care doctor if your are not feeling better in a couple days.   Return to ER for new or worsening symptoms, any additional concerns.

## 2019-05-07 NOTE — ED Provider Notes (Signed)
[facility]  HOSPITAL-EMERGENCY DEPT Provider Note   CSN: [id number] Arrival date & time: 05/07/19  2034     History   Chief Complaint Chief Complaint  Patient presents with   Fall    HPI Ralph [name] is a 68 y.o. male.     The history is provided by the patient and medical records. No language interpreter was used.  Fall  Ralph [name] is a 68 y.o. male  with a PMH as listed below who presents to the Emergency Department evaluation after mechanical fall just prior to arrival.  Patient states that he has loose diabetic shoes on that do not fit him well in the floor had just been buffered, making it slippery.  This caused him to fall.  He slipped, falling backwards and landing on his right rib cage area then struck his head.  No loss of consciousness.  No nausea or vomiting.  Feels as if he did not hit his head very hard and mostly is just concerned about his right rib cage pain.  Denies shortness of breath.  No medications taken prior to arrival for symptoms.  Not on anticoagulants.  Does take aspirin daily.   Past Medical History:  Diagnosis Date   Anxiety    Arthritis    left hip per pt.   BPH (benign prostatic hyperplasia)    Cataract    had surgery   Depression    Diabetes mellitus without complication (HCC)    Heart murmur    slight   Hyperlipidemia    Hypertension    Thyroid disease    hypothyroid    Patient Active Problem List   Diagnosis Date Noted   Malnutrition of moderate degree 11/26/2016   Septic arthritis of foot (HCC) 11/26/2016   Open wound of left foot 11/25/2016   Thyroid disease    Hypertension    Diabetes mellitus without complication (HCC)    Depression    BPH (benign prostatic hyperplasia)    Anxiety     Past Surgical History:  Procedure Laterality Date   AMPUTATION Left 11/26/2016   Procedure: RAY AMPUTATION LEFT FIFTH TOE;  Surgeon: Beverely Low, MD;  [facility] OR;  Service: Orthopedics;  Laterality:  Left;   CATARACT EXTRACTION     Bil with implants   COLONOSCOPY     per pt/ had 2 colon but not sure when.   EYE SURGERY     Bil radial orbital decompression surg / per pt eye socket surgery./ has had 10 surgeries   LOWER EXTREMITY ANGIOGRAPHY N/A 11/28/2016   Procedure: Lower Extremity Angiography;  Surgeon: Chuck Hint, MD;  Location: [facility] INVASIVE CV LAB;  Service: Cardiovascular;  Laterality: N/A;   PERIPHERAL VASCULAR INTERVENTION Left 11/28/2016   Procedure: Peripheral Vascular Intervention;  Surgeon: Chuck Hint, MD;  Location: [facility] INVASIVE CV LAB;  Service: Cardiovascular;  Laterality: Left;  common iliac   thyroid ablation     TONSILLECTOMY     as a child        Home Medications    Prior to Admission medications   Medication Sig Start Date End Date Taking? Authorizing Provider  aspirin EC 81 MG tablet Take 81 mg by mouth daily.   Yes [provider]  benztropine (COGENTIN) 0.5 MG tablet Take 0.5 mg by mouth 2 (two) times daily.   Yes [provider]  buPROPion (WELLBUTRIN XL) 300 MG 24 hr tablet Take 300 mg by mouth daily.   Yes [provider]  clonazePAM (KLONOPIN) 0.25 MG disintegrating tablet Take 0.25 mg by mouth daily.   Yes [provider]  clonazePAM (KLONOPIN) 0.5 MG tablet Take 0.5 mg by mouth at bedtime.    Yes [provider]  divalproex (DEPAKOTE ER) 250 MG 24 hr tablet Take 250 mg by mouth daily.   Yes [provider]  divalproex (DEPAKOTE ER) 500 MG 24 hr tablet Take 1,000 mg by mouth daily.   Yes [provider]  Eyelid Cleansers (OCUSOFT LID SCRUB PLUS) PADS Place 1 each into both eyes 2 (two) times daily.   Yes [provider]  insulin detemir (LEVEMIR) 100 UNIT/ML injection Inject 20 Units into the skin at bedtime.    Yes [provider]  levocetirizine (XYZAL) 5 MG tablet Take 5 mg by mouth every morning.   Yes [provider]  levothyroxine  (SYNTHROID) 175 MCG tablet Take 175 mcg by mouth daily before breakfast.   Yes [provider]  magnesium oxide (MAG-OX) 400 MG tablet Take 400 mg by mouth daily.   Yes [provider]  metFORMIN (GLUCOPHAGE) 1000 MG tablet Take 1,000 mg by mouth 2 (two) times daily with a meal.   Yes [provider]  Multiple Vitamins-Minerals (CEROVITE SENIOR) TABS Take 1 tablet by mouth daily.    Yes [provider]  OLANZapine (ZYPREXA) 15 MG tablet Take 15 mg by mouth at bedtime.   Yes [provider]  Pancrelipase, Lip-Prot-Amyl, (CREON) 24000-76000 units CPEP Take 3 capsules by mouth 3 (three) times daily with meals. Along with 120000 units of amylase-included in capsules   Yes [provider]  Polyethyl Glycol-Propyl Glycol (SYSTANE) 0.4-0.3 % SOLN Apply 1 drop to eye 4 (four) times daily.   Yes [provider]  polyethylene glycol (MIRALAX / GLYCOLAX) 17 g packet Take 17 g by mouth daily.   Yes [provider]  pregabalin (LYRICA) 75 MG capsule Take 75 mg by mouth 2 (two) times daily.    Yes [provider]  ramipril (ALTACE) 5 MG capsule Take 5 mg by mouth daily.   Yes [provider]  simvastatin (ZOCOR) 40 MG tablet Take 40 mg by mouth at bedtime.   Yes [provider]  White Petrolatum-Mineral Oil (SYSTANE NIGHTTIME) OINT Place 1 application into both eyes at bedtime.   Yes [provider]  feeding supplement, GLUCERNA SHAKE, (GLUCERNA SHAKE) LIQD Take 237 mLs by mouth 3 (three) times daily between meals. Patient not taking: Reported on 05/07/2019 11/30/16   Ralph Petrin, DO  lidocaine (LIDODERM) 5 % Place 1 patch onto the skin daily. Remove & Discard patch within 12 hours or as directed by MD 05/07/19   Corabelle Spackman, Chase Picket, PA-C    Family History Family History  Problem Relation Age of Onset   Diabetes Mellitus II Mother    Heart disease Father    Lung cancer Father    Diabetes Mellitus  II Paternal Grandfather    Diabetes Paternal Grandfather    Diabetes Brother     Social History Social History   Tobacco Use   Smoking status: Former Smoker   Smokeless tobacco: Never Used  Substance Use Topics   Alcohol use: No   Drug use: No     Allergies   Antihistamines, chlorpheniramine-type; Antihistamines, diphenhydramine-type; Antihistamines, loratadine-type; Benadryl [diphenhydramine hcl]; and Ethanolamine   Review of Systems Review of Systems  Musculoskeletal: Positive for arthralgias and myalgias.  All other systems reviewed and are negative.  Physical Exam Updated Vital Signs BP (!) 197/88    Pulse 84    Temp 97.8 F (36.6 C) (Oral)    Resp 17    Ht 6' (1.829 m)    Wt 81.6 kg    SpO2 98%    BMI 24.41 kg/m   Physical Exam Vitals signs and nursing note reviewed.  Constitutional:      General: He is not in acute distress.    Appearance: He is well-developed.  HENT:     Head: Normocephalic and atraumatic.  Neck:     Musculoskeletal: Neck supple.  Cardiovascular:     Rate and Rhythm: Normal rate and regular rhythm.     Heart sounds: Normal heart sounds. No murmur.  Pulmonary:     Effort: Pulmonary effort is normal. No respiratory distress.     Breath sounds: Normal breath sounds.     Comments: Tenderness to the left lateral rib cage without overlying skin changes.  No crepitus.  Lungs clear to auscultation bilaterally. Abdominal:     General: There is no distension.     Palpations: Abdomen is soft.     Tenderness: There is no abdominal tenderness.  Musculoskeletal:     Comments: No midline C/T/L-spine tenderness. No tenderness to the pelvis.  Skin:    General: Skin is warm and dry.  Neurological:     Mental Status: He is alert and oriented to person, place, and time.     Comments: Alert, oriented, thought content appropriate. Able to give a coherent history. Speech is clear and goal oriented, able to follow commands.  Cranial Nerves:  II:   Peripheral visual fields grossly normal, pupils equal, round, reactive to light III, IV, VI: EOM intact bilaterally, ptosis not present V,VII: smile symmetric, eyes kept closed tightly against resistance, facial light touch sensation equal VIII: hearing grossly normal IX, X: symmetric soft palate movement, uvula elevates symmetrically  XI: bilateral shoulder shrug symmetric and strong XII: midline tongue extension 5/5 muscle strength in upper and lower extremities bilaterally including strong and equal grip strength and dorsiflexion/plantar flexion Sensory to light touch normal in all four extremities.       ED Treatments / Results  Labs (all labs ordered are listed, but only abnormal results are displayed) Labs Reviewed - No data to display  EKG None  Radiology Dg Ribs Unilateral W/chest Left  Result Date: 05/07/2019 CLINICAL DATA:  Initial evaluation for acute trauma, fall. EXAM: LEFT RIBS AND CHEST - 3+ VIEW COMPARISON:  None. FINDINGS: Cardiomegaly. Mediastinal silhouette within normal limits. Aortic atherosclerosis. Lungs are hypoinflated. Associated streaky left basilar atelectasis. Probable trace layering left pleural effusion. No other focal airspace disease. No pulmonary edema. No pneumothorax. Dedicated views of the left ribs was performed. There is a subacute to chronic healing fracture of the left posterolateral eleventh rib. No other acute displaced rib fracture identified. No other acute osseous abnormality. IMPRESSION: 1. Subacute to chronic healing fracture of the left posterolateral eleventh rib. No other acute displaced rib fracture identified. 2. Associated streaky left basilar atelectasis with probable trace layering left pleural effusion. 3. Cardiomegaly with aortic atherosclerosis. Electronically Signed   By: Jeannine Boga M.D.   On: 05/07/2019 22:40   Ct Head Wo Contrast  Result Date: 05/07/2019 CLINICAL DATA:  Fall EXAM: CT HEAD WITHOUT CONTRAST CT CERVICAL  SPINE WITHOUT CONTRAST TECHNIQUE: Multidetector CT imaging of the head and cervical spine was performed following the standard protocol without intravenous contrast. Multiplanar CT image reconstructions of the cervical  spine were also generated. COMPARISON:  None. FINDINGS: CT HEAD FINDINGS Brain: There is atrophy and chronic small vessel disease changes. No acute intracranial abnormality. Specifically, no hemorrhage, hydrocephalus, mass lesion, acute infarction, or significant intracranial injury. Vascular: No hyperdense vessel or unexpected calcification. Skull: No acute calvarial abnormality. Sinuses/Orbits: Mucosal thickening in the left maxillary sinus. No air-fluid levels. Other: None CT CERVICAL SPINE FINDINGS Alignment: Normal Skull base and vertebrae: No acute fracture. No primary bone lesion or focal pathologic process. Soft tissues and spinal canal: No prevertebral fluid or swelling. No visible canal hematoma. Disc levels: Diffuse degenerative facet disease bilaterally. Disc spaces are maintained. Upper chest: No acute findings Other: None IMPRESSION: Atrophy, chronic microvascular disease. No acute intracranial abnormality. Cervical spondylosis.  No acute bony abnormality. Electronically Signed   By: Charlett NoseKevin  Dover M.D.   On: 05/07/2019 22:54   Ct Cervical Spine Wo Contrast  Result Date: 05/07/2019 CLINICAL DATA:  Fall EXAM: CT HEAD WITHOUT CONTRAST CT CERVICAL SPINE WITHOUT CONTRAST TECHNIQUE: Multidetector CT imaging of the head and cervical spine was performed following the standard protocol without intravenous contrast. Multiplanar CT image reconstructions of the cervical spine were also generated. COMPARISON:  None. FINDINGS: CT HEAD FINDINGS Brain: There is atrophy and chronic small vessel disease changes. No acute intracranial abnormality. Specifically, no hemorrhage, hydrocephalus, mass lesion, acute infarction, or significant intracranial injury. Vascular: No hyperdense vessel or unexpected  calcification. Skull: No acute calvarial abnormality. Sinuses/Orbits: Mucosal thickening in the left maxillary sinus. No air-fluid levels. Other: None CT CERVICAL SPINE FINDINGS Alignment: Normal Skull base and vertebrae: No acute fracture. No primary bone lesion or focal pathologic process. Soft tissues and spinal canal: No prevertebral fluid or swelling. No visible canal hematoma. Disc levels: Diffuse degenerative facet disease bilaterally. Disc spaces are maintained. Upper chest: No acute findings Other: None IMPRESSION: Atrophy, chronic microvascular disease. No acute intracranial abnormality. Cervical spondylosis.  No acute bony abnormality. Electronically Signed   By: Charlett NoseKevin  Dover M.D.   On: 05/07/2019 22:54    Procedures Procedures (including critical care time)  Medications Ordered in ED Medications  lidocaine (LIDODERM) 5 % 1 patch (has no administration in time range)  acetaminophen (TYLENOL) tablet 650 mg (has no administration in time range)     Initial Impression / Assessment and Plan / ED Course  I have reviewed the triage vital signs and the nursing notes.  Pertinent labs & imaging results that were available during my care of the patient were reviewed by me and considered in my medical decision making (see chart for details).       Arsenio KatzJohn G Hourihan is a 68 y.o. male who presents to ED for evaluation after mechanical fall just prior to arrival.  Complaining of left rib cage pain and did hit his head.  No focal neurologic deficits noted.  Imaging reviewed without acute findings. Evaluation does not show pathology that would require ongoing emergent intervention or inpatient treatment. Lidoderm patches and Tylenol PRN. PCP follow up if not feeling better in a couple days. Reasons to return to ER discussed and all questions answered.    Final Clinical Impressions(s) / ED Diagnoses   Final diagnoses:  Contusion of rib on left side, initial encounter  Minor head injury, initial  encounter    ED Discharge Orders         Ordered    lidocaine (LIDODERM) 5 %  Every 24 hours     05/07/19 2259           Yavonne Kiss,  Chase PicketJaime Pilcher, PA-C 05/07/19 [id number]    Geoffery Lyonselo, [place], MD 05/10/19 2303

## 2019-05-08 DIAGNOSIS — S20212A Contusion of left front wall of thorax, initial encounter: Secondary | ICD-10-CM | POA: Diagnosis not present

## 2019-05-08 MED ORDER — RAMIPRIL 5 MG PO CAPS
5.0000 mg | ORAL_CAPSULE | Freq: Once | ORAL | Status: AC
  Administered 2019-05-08: 5 mg via ORAL
  Filled 2019-05-08: qty 1

## 2019-05-08 MED ORDER — RAMIPRIL 5 MG PO CAPS: 5.0000 mg | ORAL_CAPSULE | Freq: Every day | ORAL | Status: DC

## 2019-05-08 NOTE — ED Notes (Signed)
[place] x 2. No answer.

## 2019-10-02 ENCOUNTER — Other Ambulatory Visit: Payer: Self-pay | Admitting: Urology

## 2019-11-26 ENCOUNTER — Encounter (HOSPITAL_COMMUNITY): Payer: Self-pay | Admitting: Urology

## 2019-11-28 ENCOUNTER — Encounter (HOSPITAL_COMMUNITY): Payer: Self-pay | Admitting: Urology

## 2019-12-06 NOTE — Progress Notes (Signed)
Preop instructions for:   Ralph [name]                       Date of Birth   09-20-1950                          Date of Procedure: 12/12/19        Doctor: DR Marcine Matar  Time to arrive at [facility]:0900am  Report to: Admitting  Procedure:Transurethral Resection of Prostate    Do not eat or drink past midnight the night before your procedure.(To include any tube feedings-must be discontinued)    Take these morning medications only with sips of water.(or give through gastrostomy or feeding tube). Pregabalin, Benztropine, Tamsulosin, Bupropion, Clonazepam, Levothyroxine, Levocetirizine , Eye drops as usual  Night before surgery Take 1/2 of Levemir Dose of Insulin  Note: No Insulin or Diabetic meds should be given or taken the morning of the procedure!   Facility contact:  [name] [phone]                Phone:                  Health Care POA: Ralph [name]- [phone] Transportation contact phone#:  Please send day of procedure:current med list and meds last taken that day, confirm nothing by mouth status from what time, Patient Demographic info( to include DNR status, problem list, allergies)   RN contact name/phone#:                             and Fax #: [phone]  Bring Insurance card and picture ID Leave all jewelry and other valuables at place where living( no metal or rings to be worn) No contact lens Women-no make-up, no lotions,perfumes,powders Men-no colognes,lotions  Any questions day of procedure,call  [facility]AY-[phone]   Sent from :[facility]                   Phone:[phone]                   [phone]  Sent by : Ralph Diver RN

## 2019-12-09 NOTE — Progress Notes (Signed)
Attempted x 2 to each of fax numbers given to [facility] with unsuccessful fax.  Called facility and they are to call me back.

## 2019-12-09 NOTE — Progress Notes (Signed)
Called and left voice mail message on 12/06/19 to nicole Weeks - guardian for patient.  Patient called back late on 12/09/19 pm and LVMM .  Called Joni Reining Weeks this am and informed of surgery.  She stated she would have to get approval for surgery and knew nothing of surgery.  Informed [name] of what patient was having done . Phone consent phone call set up for 12/10/19 at 0900am.  Called and LVMM for Yates Decamp regarding above with contact phone numbers for [employer].   [phone] or [phone].

## 2019-12-10 NOTE — Progress Notes (Signed)
Spoke with sister Keeon Zurn regarding date and time of surgery.  Per sister she does not plan to be here for surgery. She lives in [place].

## 2019-12-10 NOTE — Progress Notes (Signed)
preop instructions faxed to [facility] with confirmation of facilty that preop instructions were received

## 2019-12-11 NOTE — Progress Notes (Signed)
Spoke with Devonne Doughty at [facility].  She had called and left a voice mail.  Returned her call and she stated she had received preop instructions and all the information needed from [employer]. (guardian )

## 2019-12-12 ENCOUNTER — Encounter (HOSPITAL_COMMUNITY): Payer: Self-pay | Admitting: Urology

## 2019-12-12 ENCOUNTER — Encounter (HOSPITAL_COMMUNITY)
Admission: RE | Disposition: A | Discharge status: Skilled Nursing Facility | Payer: Self-pay | Source: Home / Self Care | Attending: Urology

## 2019-12-12 ENCOUNTER — Ambulatory Visit (HOSPITAL_COMMUNITY): Payer: Medicare Other | Admitting: Certified Registered"

## 2019-12-12 ENCOUNTER — Other Ambulatory Visit: Payer: Self-pay

## 2019-12-12 ENCOUNTER — Ambulatory Visit (HOSPITAL_COMMUNITY)
Admission: RE | Admit: 2019-12-12 | Discharge: 2019-12-13 | Disposition: A | Discharge status: Skilled Nursing Facility | Payer: Medicare Other | Attending: Urology | Admitting: Urology

## 2019-12-12 DIAGNOSIS — E1136 Type 2 diabetes mellitus with diabetic cataract: Secondary | ICD-10-CM | POA: Diagnosis not present

## 2019-12-12 DIAGNOSIS — N401 Enlarged prostate with lower urinary tract symptoms: Secondary | ICD-10-CM | POA: Insufficient documentation

## 2019-12-12 DIAGNOSIS — Z89422 Acquired absence of other left toe(s): Secondary | ICD-10-CM | POA: Insufficient documentation

## 2019-12-12 DIAGNOSIS — R338 Other retention of urine: Secondary | ICD-10-CM | POA: Insufficient documentation

## 2019-12-12 DIAGNOSIS — E1151 Type 2 diabetes mellitus with diabetic peripheral angiopathy without gangrene: Secondary | ICD-10-CM | POA: Diagnosis not present

## 2019-12-12 DIAGNOSIS — N320 Bladder-neck obstruction: Secondary | ICD-10-CM | POA: Diagnosis not present

## 2019-12-12 DIAGNOSIS — G2401 Drug induced subacute dyskinesia: Secondary | ICD-10-CM | POA: Insufficient documentation

## 2019-12-12 DIAGNOSIS — Z8249 Family history of ischemic heart disease and other diseases of the circulatory system: Secondary | ICD-10-CM | POA: Diagnosis not present

## 2019-12-12 DIAGNOSIS — M1612 Unilateral primary osteoarthritis, left hip: Secondary | ICD-10-CM | POA: Insufficient documentation

## 2019-12-12 DIAGNOSIS — E039 Hypothyroidism, unspecified: Secondary | ICD-10-CM | POA: Insufficient documentation

## 2019-12-12 DIAGNOSIS — Z20822 Contact with and (suspected) exposure to covid-19: Secondary | ICD-10-CM | POA: Insufficient documentation

## 2019-12-12 DIAGNOSIS — Z888 Allergy status to other drugs, medicaments and biological substances status: Secondary | ICD-10-CM | POA: Diagnosis not present

## 2019-12-12 DIAGNOSIS — Z87891 Personal history of nicotine dependence: Secondary | ICD-10-CM | POA: Diagnosis not present

## 2019-12-12 DIAGNOSIS — I10 Essential (primary) hypertension: Secondary | ICD-10-CM | POA: Diagnosis not present

## 2019-12-12 DIAGNOSIS — N138 Other obstructive and reflux uropathy: Secondary | ICD-10-CM | POA: Diagnosis not present

## 2019-12-12 DIAGNOSIS — E785 Hyperlipidemia, unspecified: Secondary | ICD-10-CM | POA: Diagnosis not present

## 2019-12-12 DIAGNOSIS — F209 Schizophrenia, unspecified: Secondary | ICD-10-CM | POA: Diagnosis not present

## 2019-12-12 DIAGNOSIS — J9589 Other postprocedural complications and disorders of respiratory system, not elsewhere classified: Secondary | ICD-10-CM | POA: Diagnosis not present

## 2019-12-12 DIAGNOSIS — Z833 Family history of diabetes mellitus: Secondary | ICD-10-CM | POA: Diagnosis not present

## 2019-12-12 HISTORY — DX: Polyneuropathy, unspecified: G62.9

## 2019-12-12 HISTORY — DX: Hypothyroidism, unspecified: E03.9

## 2019-12-12 HISTORY — DX: Other constipation: K59.09

## 2019-12-12 HISTORY — DX: Unspecified osteoarthritis, unspecified site: M19.90

## 2019-12-12 HISTORY — DX: Allergic rhinitis, unspecified: J30.9

## 2019-12-12 HISTORY — DX: Peripheral vascular disease, unspecified: I73.9

## 2019-12-12 HISTORY — DX: Unspecified urinary incontinence: R32

## 2019-12-12 HISTORY — DX: Schizophrenia, unspecified: F20.9

## 2019-12-12 HISTORY — DX: Other specified diseases of pancreas: K86.89

## 2019-12-12 HISTORY — PX: TRANSURETHRAL RESECTION OF PROSTATE: SHX73

## 2019-12-12 LAB — BASIC METABOLIC PANEL
Anion gap: 8 (ref 5–15)
BUN: 24 mg/dL — ABNORMAL HIGH (ref 8–23)
CO2: 25 mmol/L (ref 22–32)
Calcium: 8.8 mg/dL — ABNORMAL LOW (ref 8.9–10.3)
Chloride: 108 mmol/L (ref 98–111)
Creatinine, Ser: 1.35 mg/dL — ABNORMAL HIGH (ref 0.61–1.24)
GFR calc Af Amer: >60 mL/min (ref >60)
GFR calc non Af Amer: 53 mL/min — ABNORMAL LOW (ref >60)
Glucose, Bld: 138 mg/dL — ABNORMAL HIGH (ref 70–99)
Potassium: 4.2 mmol/L (ref 3.5–5.1)
Sodium: 141 mmol/L (ref 135–145)

## 2019-12-12 LAB — RESPIRATORY PANEL BY RT PCR (FLU A&B, COVID)
Influenza A by PCR: NEGATIVE
Influenza B by PCR: NEGATIVE
SARS Coronavirus 2 by RT PCR: NEGATIVE

## 2019-12-12 LAB — GLUCOSE, CAPILLARY
Glucose-Capillary: 129 mg/dL — ABNORMAL HIGH (ref 70–99)
Glucose-Capillary: 101 mg/dL — ABNORMAL HIGH (ref 70–99)
Glucose-Capillary: 189 mg/dL — ABNORMAL HIGH (ref 70–99)
Glucose-Capillary: 296 mg/dL — ABNORMAL HIGH (ref 70–99)

## 2019-12-12 LAB — CBC
HCT: 32.6 % — ABNORMAL LOW (ref 39.0–52.0)
Hemoglobin: 10.5 g/dL — ABNORMAL LOW (ref 13.0–17.0)
MCH: 31.5 pg (ref 26.0–34.0)
MCHC: 32.2 g/dL (ref 30.0–36.0)
MCV: 97.9 fL (ref 80.0–100.0)
Platelets: 241 10*3/uL (ref 150–400)
RBC: 3.33 MIL/uL — ABNORMAL LOW (ref 4.22–5.81)
RDW: 13.2 % (ref 11.5–15.5)
WBC: 7.8 10*3/uL (ref 4.0–10.5)
nRBC: 0 % (ref 0.0–0.2)

## 2019-12-12 SURGERY — TURP (TRANSURETHRAL RESECTION OF PROSTATE)
Anesthesia: General

## 2019-12-12 MED ORDER — OLANZAPINE 5 MG PO TABS
20.0000 mg | ORAL_TABLET | Freq: Every day | ORAL | Status: DC
  Administered 2019-12-12: 22:00:00 20 mg via ORAL
  Filled 2019-12-12: qty 4

## 2019-12-12 MED ORDER — REFRESH P.M. OP OINT: TOPICAL_OINTMENT | OPHTHALMIC | Status: DC

## 2019-12-12 MED ORDER — FENTANYL CITRATE (PF) 100 MCG/2ML IJ SOLN
INTRAMUSCULAR | Status: DC | PRN
  Administered 2019-12-12: 25 ug via INTRAVENOUS
  Administered 2019-12-12: 50 ug via INTRAVENOUS
  Administered 2019-12-12: 25 ug via INTRAVENOUS

## 2019-12-12 MED ORDER — VALBENAZINE TOSYLATE 40 MG PO CAPS
40.0000 mg | ORAL_CAPSULE | Freq: Every day | ORAL | Status: DC
  Filled 2019-12-12 (×2): qty 1

## 2019-12-12 MED ORDER — LEVOTHYROXINE SODIUM 75 MCG PO TABS
150.0000 ug | ORAL_TABLET | Freq: Every day | ORAL | Status: DC
  Administered 2019-12-13: 150 ug via ORAL
  Filled 2019-12-12: qty 2

## 2019-12-12 MED ORDER — NUTRITIONAL SUPPLEMENT PO LIQD: 1.0000 | Freq: Three times a day (TID) | ORAL | Status: DC

## 2019-12-12 MED ORDER — HYDROCODONE-ACETAMINOPHEN 5-325 MG PO TABS: 1.0000 | ORAL_TABLET | ORAL | Status: DC | PRN

## 2019-12-12 MED ORDER — DEXAMETHASONE SODIUM PHOSPHATE 10 MG/ML IJ SOLN
INTRAMUSCULAR | Status: DC | PRN
  Administered 2019-12-12: 4 mg via INTRAVENOUS

## 2019-12-12 MED ORDER — PROPOFOL 10 MG/ML IV BOLUS
INTRAVENOUS | Status: AC
  Filled 2019-12-12: qty 20

## 2019-12-12 MED ORDER — SODIUM CHLORIDE 0.9 % IR SOLN
Status: DC | PRN
  Administered 2019-12-12: 30000 mL

## 2019-12-12 MED ORDER — ONDANSETRON HCL 4 MG/2ML IJ SOLN
INTRAMUSCULAR | Status: AC
  Filled 2019-12-12: qty 2

## 2019-12-12 MED ORDER — CERTAVITE/ANTIOXIDANTS PO TABS: 1.0000 | ORAL_TABLET | Freq: Every day | ORAL | Status: DC

## 2019-12-12 MED ORDER — POLYETHYLENE GLYCOL 3350 17 G PO PACK
17.0000 g | PACK | Freq: Every day | ORAL | Status: DC
  Administered 2019-12-12 – 2019-12-13 (×2): 17 g via ORAL
  Filled 2019-12-12 (×2): qty 1

## 2019-12-12 MED ORDER — BUPROPION HCL ER (XL) 300 MG PO TB24
300.0000 mg | ORAL_TABLET | Freq: Every day | ORAL | Status: DC
  Administered 2019-12-12 – 2019-12-13 (×2): 300 mg via ORAL
  Filled 2019-12-12 (×2): qty 1

## 2019-12-12 MED ORDER — POLYETHYL GLYCOL-PROPYL GLYCOL 0.4-0.3 % OP SOLN: 1.0000 [drp] | OPHTHALMIC | Status: DC

## 2019-12-12 MED ORDER — ZOLPIDEM TARTRATE 5 MG PO TABS: 5.0000 mg | ORAL_TABLET | Freq: Every evening | ORAL | Status: DC | PRN

## 2019-12-12 MED ORDER — SULFAMETHOXAZOLE-TRIMETHOPRIM 800-160 MG PO TABS
1.0000 | ORAL_TABLET | Freq: Two times a day (BID) | ORAL | Status: DC
  Administered 2019-12-12 – 2019-12-13 (×2): 1 via ORAL
  Filled 2019-12-12 (×2): qty 1

## 2019-12-12 MED ORDER — OCUSOFT LID SCRUB PLUS EX PADS
1.0000 | MEDICATED_PAD | Freq: Two times a day (BID) | CUTANEOUS | Status: DC
  Administered 2019-12-12 – 2019-12-13 (×2): 1 via OPHTHALMIC

## 2019-12-12 MED ORDER — LEVOCETIRIZINE DIHYDROCHLORIDE 5 MG PO TABS: 5.0000 mg | ORAL_TABLET | Freq: Every day | ORAL | Status: DC

## 2019-12-12 MED ORDER — METFORMIN HCL 500 MG PO TABS
1000.0000 mg | ORAL_TABLET | Freq: Every day | ORAL | Status: DC
  Administered 2019-12-13: 09:00:00 1000 mg via ORAL
  Filled 2019-12-12: qty 2

## 2019-12-12 MED ORDER — FENTANYL CITRATE (PF) 100 MCG/2ML IJ SOLN: 25.0000 ug | INTRAMUSCULAR | Status: DC | PRN

## 2019-12-12 MED ORDER — ENSURE ENLIVE PO LIQD
237.0000 mL | Freq: Three times a day (TID) | ORAL | Status: DC
  Administered 2019-12-12 – 2019-12-13 (×2): 237 mL via ORAL

## 2019-12-12 MED ORDER — ADULT MULTIVITAMIN W/MINERALS CH
1.0000 | ORAL_TABLET | Freq: Every day | ORAL | Status: DC
  Administered 2019-12-12 – 2019-12-13 (×2): 1 via ORAL
  Filled 2019-12-12 (×2): qty 1

## 2019-12-12 MED ORDER — PANCRELIPASE (LIP-PROT-AMYL) 12000-38000 UNITS PO CPEP
12000.0000 [IU] | ORAL_CAPSULE | Freq: Three times a day (TID) | ORAL | Status: DC
  Administered 2019-12-13 (×2): 12000 [IU] via ORAL
  Filled 2019-12-12 (×2): qty 1

## 2019-12-12 MED ORDER — CEFAZOLIN SODIUM-DEXTROSE 2-4 GM/100ML-% IV SOLN
2.0000 g | INTRAVENOUS | Status: AC
  Administered 2019-12-12: 2 g via INTRAVENOUS
  Filled 2019-12-12: qty 100

## 2019-12-12 MED ORDER — LACTATED RINGERS IV SOLN: INTRAVENOUS | Status: DC

## 2019-12-12 MED ORDER — PROPOFOL 10 MG/ML IV BOLUS
INTRAVENOUS | Status: DC | PRN
  Administered 2019-12-12: 200 mg via INTRAVENOUS

## 2019-12-12 MED ORDER — CLONAZEPAM 0.5 MG PO TABS
0.5000 mg | ORAL_TABLET | Freq: Every day | ORAL | Status: DC
  Administered 2019-12-12: 0.5 mg via ORAL
  Filled 2019-12-12: qty 1

## 2019-12-12 MED ORDER — GENTEAL TEARS NIGHT-TIME OP OINT: 1.0000 "application " | TOPICAL_OINTMENT | Freq: Every day | OPHTHALMIC | Status: DC

## 2019-12-12 MED ORDER — SIMVASTATIN 40 MG PO TABS
40.0000 mg | ORAL_TABLET | Freq: Every day | ORAL | Status: DC
  Administered 2019-12-12: 40 mg via ORAL
  Filled 2019-12-12: qty 1

## 2019-12-12 MED ORDER — DIVALPROEX SODIUM ER 500 MG PO TB24
1000.0000 mg | ORAL_TABLET | Freq: Every evening | ORAL | Status: DC
  Administered 2019-12-12: 18:00:00 1000 mg via ORAL
  Filled 2019-12-12: qty 2

## 2019-12-12 MED ORDER — ARTIFICIAL TEARS OPHTHALMIC OINT
TOPICAL_OINTMENT | Freq: Every day | OPHTHALMIC | Status: DC
  Filled 2019-12-12: qty 3.5

## 2019-12-12 MED ORDER — PROMETHAZINE HCL 25 MG/ML IJ SOLN: 6.2500 mg | INTRAMUSCULAR | Status: DC | PRN

## 2019-12-12 MED ORDER — CETIRIZINE HCL 10 MG PO TABS
10.0000 mg | ORAL_TABLET | Freq: Every day | ORAL | Status: DC
  Administered 2019-12-12 – 2019-12-13 (×2): 10 mg via ORAL
  Filled 2019-12-12 (×2): qty 1

## 2019-12-12 MED ORDER — POLYVINYL ALCOHOL 1.4 % OP SOLN
1.0000 [drp] | OPHTHALMIC | Status: DC | PRN
  Administered 2019-12-12: 1 [drp] via OPHTHALMIC
  Filled 2019-12-12: qty 15

## 2019-12-12 MED ORDER — INSULIN DETEMIR 100 UNIT/ML ~~LOC~~ SOLN
15.0000 [IU] | Freq: Every day | SUBCUTANEOUS | Status: DC
  Administered 2019-12-12: 22:00:00 15 [IU] via SUBCUTANEOUS
  Filled 2019-12-12 (×2): qty 0.15

## 2019-12-12 MED ORDER — 0.9 % SODIUM CHLORIDE (POUR BTL) OPTIME
TOPICAL | Status: DC | PRN
  Administered 2019-12-12: 2000 mL

## 2019-12-12 MED ORDER — CHLORHEXIDINE GLUCONATE CLOTH 2 % EX PADS: 6.0000 | MEDICATED_PAD | Freq: Every day | CUTANEOUS | Status: DC

## 2019-12-12 MED ORDER — GLUCOSE MANAGEMENT PO TABS: 1.0000 | ORAL_TABLET | ORAL | Status: DC | PRN

## 2019-12-12 MED ORDER — RAMIPRIL 5 MG PO CAPS
5.0000 mg | ORAL_CAPSULE | Freq: Every day | ORAL | Status: DC
  Administered 2019-12-12 – 2019-12-13 (×2): 5 mg via ORAL
  Filled 2019-12-12 (×2): qty 1

## 2019-12-12 MED ORDER — DIVALPROEX SODIUM ER 250 MG PO TB24
250.0000 mg | ORAL_TABLET | Freq: Every evening | ORAL | Status: DC
  Administered 2019-12-12: 250 mg via ORAL
  Filled 2019-12-12: qty 1

## 2019-12-12 MED ORDER — SODIUM CHLORIDE 0.9 % IR SOLN
3000.0000 mL | Status: DC
  Administered 2019-12-12 (×2): 3000 mL

## 2019-12-12 MED ORDER — DEXAMETHASONE SODIUM PHOSPHATE 10 MG/ML IJ SOLN
INTRAMUSCULAR | Status: AC
  Filled 2019-12-12: qty 1

## 2019-12-12 MED ORDER — SENNA 8.6 MG PO TABS
1.0000 | ORAL_TABLET | Freq: Two times a day (BID) | ORAL | Status: DC
  Administered 2019-12-12 – 2019-12-13 (×2): 8.6 mg via ORAL
  Filled 2019-12-12 (×2): qty 1

## 2019-12-12 MED ORDER — MINERAL OIL RE ENEM
1.0000 | ENEMA | Freq: Every day | RECTAL | Status: DC | PRN
  Filled 2019-12-12: qty 1

## 2019-12-12 MED ORDER — LIDOCAINE 2% (20 MG/ML) 5 ML SYRINGE
INTRAMUSCULAR | Status: AC
  Filled 2019-12-12: qty 5

## 2019-12-12 MED ORDER — ACETAMINOPHEN 325 MG PO TABS: 650.0000 mg | ORAL_TABLET | ORAL | Status: DC | PRN

## 2019-12-12 MED ORDER — LIDOCAINE 2% (20 MG/ML) 5 ML SYRINGE
INTRAMUSCULAR | Status: DC | PRN
  Administered 2019-12-12: 100 mg via INTRAVENOUS

## 2019-12-12 MED ORDER — CLONAZEPAM 0.125 MG PO TBDP
0.2500 mg | ORAL_TABLET | Freq: Every day | ORAL | Status: DC
  Administered 2019-12-13: 0.25 mg via ORAL
  Filled 2019-12-12: qty 2

## 2019-12-12 MED ORDER — SODIUM CHLORIDE 0.45 % IV SOLN: INTRAVENOUS | Status: DC

## 2019-12-12 MED ORDER — ONDANSETRON HCL 4 MG/2ML IJ SOLN
INTRAMUSCULAR | Status: DC | PRN
  Administered 2019-12-12: 4 mg via INTRAVENOUS

## 2019-12-12 MED ORDER — BENZTROPINE MESYLATE 0.5 MG PO TABS
0.5000 mg | ORAL_TABLET | Freq: Two times a day (BID) | ORAL | Status: DC
  Administered 2019-12-12 – 2019-12-13 (×2): 0.5 mg via ORAL
  Filled 2019-12-12 (×2): qty 1

## 2019-12-12 MED ORDER — FENTANYL CITRATE (PF) 100 MCG/2ML IJ SOLN
INTRAMUSCULAR | Status: AC
  Filled 2019-12-12: qty 2

## 2019-12-12 MED ORDER — PREGABALIN 75 MG PO CAPS
75.0000 mg | ORAL_CAPSULE | Freq: Two times a day (BID) | ORAL | Status: DC
  Administered 2019-12-12 – 2019-12-13 (×2): 75 mg via ORAL
  Filled 2019-12-12 (×2): qty 1

## 2019-12-12 MED ORDER — BELLADONNA ALKALOIDS-OPIUM 16.2-60 MG RE SUPP: 1.0000 | Freq: Four times a day (QID) | RECTAL | Status: DC | PRN

## 2019-12-12 SURGICAL SUPPLY — 19 items
BAG DRN RND TRDRP ANRFLXCHMBR (UROLOGICAL SUPPLIES) ×1
BAG URINE DRAIN 2000ML AR STRL (UROLOGICAL SUPPLIES) ×2 IMPLANT
BAG URO CATCHER STRL LF (MISCELLANEOUS) ×2 IMPLANT
BLADE SURG 15 STRL LF DISP TIS (BLADE) IMPLANT
BLADE SURG 15 STRL SS (BLADE)
CATH FOLEY 3WAY 30CC 22FR (CATHETERS) ×2 IMPLANT
EVACUATOR MICROVAS BLADDER (UROLOGICAL SUPPLIES) ×2 IMPLANT
GLOVE BIOGEL M 8.0 STRL (GLOVE) ×2 IMPLANT
GOWN STRL REUS W/TWL XL LVL3 (GOWN DISPOSABLE) ×2 IMPLANT
HOLDER FOLEY CATH W/STRAP (MISCELLANEOUS) ×1 IMPLANT
KIT TURNOVER KIT A (KITS) IMPLANT
LOOP CUT BIPOLAR 24F LRG (ELECTROSURGICAL) ×2 IMPLANT
MANIFOLD NEPTUNE II (INSTRUMENTS) ×2 IMPLANT
PACK CYSTO (CUSTOM PROCEDURE TRAY) ×2 IMPLANT
PENCIL SMOKE EVACUATOR (MISCELLANEOUS) IMPLANT
SUT ETHILON 3 0 PS 1 (SUTURE) IMPLANT
SYR 30ML LL (SYRINGE) ×1 IMPLANT
TUBING CONNECTING 10 (TUBING) ×2 IMPLANT
TUBING UROLOGY SET (TUBING) ×2 IMPLANT

## 2019-12-12 NOTE — H&P (Signed)
H&P  Chief Complaint: Difficulty urinating  History of Present Illness: 69 year old male with multiple psychiatric issues as well as difficulty urinating from BPH.  The patient had a UroLift procedure within the past 18 months.  This initially helped his symptoms, but he still has significant symptoms as well as inadequate bladder emptying.  He is chosen to have a TURP to further manage his bladder outlet obstruction.  Past Medical History:  Diagnosis Date  . Allergic rhinitis   . Anxiety   . Arthritis    left hip per pt.  Marland Kitchen BPH (benign prostatic hyperplasia)   . Cataract    had surgery  . Chronic constipation   . Depression   . Diabetes mellitus without complication (HCC)    type 2  . Heart murmur    slight  . Hyperlipidemia   . Hypertension   . Hypothyroidism   . Neuropathy   . Osteoarthritis   . Pancreatic insufficiency   . Peripheral arterial disease (HCC)   . Schizophrenia (HCC)    paranoid   . Thyroid disease    hypothyroid  . Urinary incontinence     Past Surgical History:  Procedure Laterality Date  . AMPUTATION Left 11/26/2016   Procedure: RAY AMPUTATION LEFT FIFTH TOE;  Surgeon: Beverely Low, MD;  Location: [facility] OR;  Service: Orthopedics;  Laterality: Left;  . CATARACT EXTRACTION     Bil with implants  . COLONOSCOPY     per pt/ had 2 colon but not sure when.  . COLONOSCOPY    . EYE SURGERY     Bil radial orbital decompression surg / per pt eye socket surgery./ has had 10 surgeries  . Left [facility] PTS/Stent 2018- Dr Edilia Bo     . LOWER EXTREMITY ANGIOGRAPHY N/A 11/28/2016   Procedure: Lower Extremity Angiography;  Surgeon: Chuck Hint, MD;  Location: [facility] INVASIVE CV LAB;  Service: Cardiovascular;  Laterality: N/A;  . PERIPHERAL VASCULAR INTERVENTION Left 11/28/2016   Procedure: Peripheral Vascular Intervention;  Surgeon: Chuck Hint, MD;  Location: [facility] INVASIVE CV LAB;  Service: Cardiovascular;  Laterality: Left;  common iliac  . thyroid ablation     . TONSILLECTOMY     as a child    Home Medications:    Allergies:  Allergies  Allergen Reactions  . Antihistamines, Chlorpheniramine-Type Other (See Comments)    Per MAR/per pt, family members did not want him to take these meds/ no allergies  . Antihistamines, Diphenhydramine-Type Other (See Comments)    Per MAR  . Antihistamines, Loratadine-Type Other (See Comments)    Per MAR  . Ethanolamine Other (See Comments)    Per MAR    Family History  Problem Relation Age of Onset  . Diabetes Mellitus II Mother   . Heart disease Father   . Lung cancer Father   . Diabetes Mellitus II Paternal Grandfather   . Diabetes Paternal Grandfather   . Diabetes Brother     Social History:  reports that he has quit smoking. He has never used smokeless tobacco. He reports that he does not drink alcohol or use drugs.  ROS: A complete review of systems was performed.  All systems are negative except for pertinent findings as noted.  Physical Exam:  Vital signs in last 24 hours: BP (!) 175/90   Pulse 79   Temp 98 F (36.7 C) (Oral)   Resp 16   Wt 80.6 kg   SpO2 99%   BMI 24.11 kg/m  Constitutional:  Alert and oriented,  No acute distress Cardiovascular: Regular rate  Respiratory: Normal respiratory effort GI: Abdomen is soft, nontender, nondistended, no abdominal masses. No CVAT.  Genitourinary: Normal male phallus, testes are descended bilaterally and non-tender and without masses, scrotum is normal in appearance without lesions or masses, perineum is normal on inspection. Lymphatic: No lymphadenopathy Neurologic: Grossly intact, no focal deficits Psychiatric: Normal mood and affect  Laboratory Data:  Recent Labs    12/12/19 0951  WBC 7.8  HGB 10.5*  HCT 32.6*  PLT 241    Recent Labs    12/12/19 0951  NA 141  K 4.2  CL 108  GLUCOSE 138*  BUN 24*  CALCIUM 8.8*  CREATININE 1.35*     Results for orders placed or performed during the hospital encounter of 12/12/19  (from the past 24 hour(s))  Respiratory Panel by RT PCR (Flu A&B, Covid) - Nasopharyngeal Swab     Status: None   Collection Time: 12/12/19  9:48 AM   Specimen: Nasopharyngeal Swab  Result Value Ref Range   SARS Coronavirus 2 by RT PCR NEGATIVE NEGATIVE   Influenza A by PCR NEGATIVE NEGATIVE   Influenza B by PCR NEGATIVE NEGATIVE  Basic metabolic panel     Status: Abnormal   Collection Time: 12/12/19  9:51 AM  Result Value Ref Range   Sodium 141 135 - 145 mmol/L   Potassium 4.2 3.5 - 5.1 mmol/L   Chloride 108 98 - 111 mmol/L   CO2 25 22 - 32 mmol/L   Glucose, Bld 138 (H) 70 - 99 mg/dL   BUN 24 (H) 8 - 23 mg/dL   Creatinine, Ser 2.02 (H) 0.61 - 1.24 mg/dL   Calcium 8.8 (L) 8.9 - 10.3 mg/dL   GFR calc non Af Amer 53 (L) >60 mL/min   GFR calc Af Amer >60 >60 mL/min   Anion gap 8 5 - 15  CBC     Status: Abnormal   Collection Time: 12/12/19  9:51 AM  Result Value Ref Range   WBC 7.8 4.0 - 10.5 K/uL   RBC 3.33 (L) 4.22 - 5.81 MIL/uL   Hemoglobin 10.5 (L) 13.0 - 17.0 g/dL   HCT 54.2 (L) 70.6 - 23.7 %   MCV 97.9 80.0 - 100.0 fL   MCH 31.5 26.0 - 34.0 pg   MCHC 32.2 30.0 - 36.0 g/dL   RDW 62.8 31.5 - 17.6 %   Platelets 241 150 - 400 K/uL   nRBC 0.0 0.0 - 0.2 %  Glucose, capillary     Status: Abnormal   Collection Time: 12/12/19 10:14 AM  Result Value Ref Range   Glucose-Capillary 129 (H) 70 - 99 mg/dL   Recent Results (from the past 240 hour(s))  Respiratory Panel by RT PCR (Flu A&B, Covid) - Nasopharyngeal Swab     Status: None   Collection Time: 12/12/19  9:48 AM   Specimen: Nasopharyngeal Swab  Result Value Ref Range Status   SARS Coronavirus 2 by RT PCR NEGATIVE NEGATIVE Final    Comment: (NOTE) SARS-CoV-2 target nucleic acids are NOT DETECTED. The SARS-CoV-2 RNA is generally detectable in upper respiratoy specimens during the acute phase of infection. The lowest concentration of SARS-CoV-2 viral copies this assay can detect is 131 copies/mL. A negative result does not  preclude SARS-Cov-2 infection and should not be used as the sole basis for treatment or other patient management decisions. A negative result may occur with  improper specimen collection/handling, submission of specimen other than nasopharyngeal swab, presence of  viral mutation(s) within the areas targeted by this assay, and inadequate number of viral copies (<131 copies/mL). A negative result must be combined with clinical observations, patient history, and epidemiological information. The expected result is Negative. Fact Sheet for Patients:  PinkCheek.be Fact Sheet for Healthcare Providers:  GravelBags.it This test is not yet ap proved or cleared by the [place] FDA and  has been authorized for detection and/or diagnosis of SARS-CoV-2 by FDA under an Emergency Use Authorization (EUA). This EUA will remain  in effect (meaning this test can be used) for the duration of the COVID-19 declaration under Section 564(b)(1) of the Act, 21 U.S.C. section 360bbb-3(b)(1), unless the authorization is terminated or revoked sooner.    Influenza A by PCR NEGATIVE NEGATIVE Final   Influenza B by PCR NEGATIVE NEGATIVE Final    Comment: (NOTE) The Xpert Xpress SARS-CoV-2/FLU/RSV assay is intended as an aid in  the diagnosis of influenza from Nasopharyngeal swab specimens and  should not be used as a sole basis for treatment. Nasal washings and  aspirates are unacceptable for Xpert Xpress SARS-CoV-2/FLU/RSV  testing. Fact Sheet for Patients: PinkCheek.be Fact Sheet for Healthcare Providers: GravelBags.it This test is not yet approved or cleared by the [place] FDA and  has been authorized for detection and/or diagnosis of SARS-CoV-2 by  FDA under an Emergency Use Authorization (EUA). This EUA will remain  in effect (meaning this test can be used) for the duration of the   Covid-19 declaration under Section 564(b)(1) of the Act, 21  U.S.C. section 360bbb-3(b)(1), unless the authorization is  terminated or revoked. Performed at [facility], [address], [place], [place] [address]     Renal Function: Recent Labs    12/12/19 0951  CREATININE 1.35*   CrCl cannot be calculated (Unknown ideal weight.).  Radiologic Imaging: No results found.  Impression/Assessment:  BPH with retention  Plan:  TURP

## 2019-12-12 NOTE — Anesthesia Procedure Notes (Signed)
Procedure Name: LMA Insertion Date/Time: 12/12/2019 12:07 PM Performed by: Sindy Guadeloupe, CRNA Pre-anesthesia Checklist: Patient identified, Emergency Drugs available, Suction available, Patient being monitored and Timeout performed Patient Re-evaluated:Patient Re-evaluated prior to induction Oxygen Delivery Method: Circle system utilized Preoxygenation: Pre-oxygenation with 100% oxygen Induction Type: IV induction Ventilation: Mask ventilation without difficulty LMA: LMA inserted LMA Size: 4.0 Tube secured with: Tape Dental Injury: Teeth and Oropharynx as per pre-operative assessment

## 2019-12-12 NOTE — Anesthesia Preprocedure Evaluation (Signed)
Anesthesia Evaluation  Patient identified by MRN, date of birth, ID band Patient awake    Reviewed: Allergy & Precautions, NPO status , Patient's Chart, lab work & pertinent test results  Airway Mallampati: II  TM Distance: >3 FB Neck ROM: Full    Dental no notable dental hx.    Pulmonary neg pulmonary ROS, former smoker,    Pulmonary exam normal breath sounds clear to auscultation       Cardiovascular hypertension, Normal cardiovascular exam Rhythm:Regular Rate:Normal     Neuro/Psych Schizophrenia negative neurological ROS     GI/Hepatic negative GI ROS, Neg liver ROS,   Endo/Other  diabetes, Type 2Hypothyroidism   Renal/GU negative Renal ROS  negative genitourinary   Musculoskeletal negative musculoskeletal ROS (+)   Abdominal   Peds negative pediatric ROS (+)  Hematology negative hematology ROS (+)   Anesthesia Other Findings   Reproductive/Obstetrics negative OB ROS                             Anesthesia Physical Anesthesia Plan  ASA: III  Anesthesia Plan: General   Post-op Pain Management:    Induction: Intravenous  PONV Risk Score and Plan: 2 and Ondansetron, Dexamethasone and Treatment may vary due to age or medical condition  Airway Management Planned: LMA  Additional Equipment:   Intra-op Plan:   Post-operative Plan: Extubation in OR  Informed Consent: I have reviewed the patients History and Physical, chart, labs and discussed the procedure including the risks, benefits and alternatives for the proposed anesthesia with the patient or authorized representative who has indicated his/her understanding and acceptance.     Dental advisory given  Plan Discussed with: CRNA and Surgeon  Anesthesia Plan Comments:         Anesthesia Quick Evaluation

## 2019-12-12 NOTE — Interval H&P Note (Signed)
History and Physical Interval Note:  12/12/2019 11:52 AM  Ralph [name]  has presented today for surgery, with the diagnosis of BENIGN PROSTATIC HYPERPLASIA.  The various methods of treatment have been discussed with the patient and family. After consideration of risks, benefits and other options for treatment, the patient has consented to  Procedure(s) with comments: TRANSURETHRAL RESECTION OF THE PROSTATE (TURP) (N/A) - 75 MINS as a surgical intervention.  The patient's history has been reviewed, patient examined, no change in status, stable for surgery.  I have reviewed the patient's chart and labs.  Questions were answered to the patient's satisfaction.     Bertram Millard Pranay Hilbun

## 2019-12-12 NOTE — Op Note (Signed)
Preoperative diagnosis: 1. Bladder outlet obstruction secondary to BPH  Postoperative diagnosis:  1. Bladder outlet obstruction secondary to BPH  Procedure:  1. Cystoscopy 2. Transurethral resection of the prostate  Surgeon: Bertram Millard. Juelz Whittenberg, M.D.  Anesthesia: General  Complications: None  Drain: Foley catheter  EBL: Minimal  Specimens: 1. Prostate chips  Disposition of specimens: Pathology  Indication: Ralph [name] is a patient with bladder outlet obstruction secondary to benign prostatic hyperplasia. After reviewing the management options for treatment, he elected to proceed with the above surgical procedure(s). We have discussed the potential benefits and risks of the procedure, side effects of the proposed treatment, the likelihood of the patient achieving the goals of the procedure, and any potential problems that might occur during the procedure or recuperation. Informed consent has been obtained.  Description of procedure:  The patient was identified in the holding area. He received preoperative antibiotics. He was then taken to the operating room. General anesthetic was administered.  The patient was then placed in the dorsal lithotomy position, prepped and draped in the usual sterile fashion. Timeout was then performed.  A resectoscope sheath was placed using the visual obturator.  The bladder was then systematically examined in its entirety. There was no evidence of  tumors, stones, or other mucosal pathology.The resectoscope, loop and telescope were then placed.  The ureteral orifices were identified so as to be avoided during the procedure.  The prostate adenoma was then resected utilizing loop cautery resection with the bipolar cutting loop.  The prostate adenoma from the bladder neck back to the verumontanum was resected beginning at the six o'clock position and then extended to include the right and left lobes of the prostate and anterior prostate,  respectively. Care was taken not to resect distal to the verumontanum.  Hemostasis was then achieved with the cautery and the bladder was emptied and reinspected with no significant bleeding noted at the end of the procedure.  Resected chips were irrigated from the bladder with the evacuator and sent to pathology.  A 3 way catheter was then placed into the bladder and placed on continuous bladder irrigation.  The patient appeared to tolerate the procedure well and without complications. The patient was able to be awakened and transferred to the recovery unit in satisfactory condition. He tolerated the procedure well.

## 2019-12-12 NOTE — Transfer of Care (Signed)
Immediate Anesthesia Transfer of Care Note  Patient: Ralph [name]  Procedure(s) Performed: TRANSURETHRAL RESECTION OF THE PROSTATE (TURP) (N/A )  Patient Location: PACU  Anesthesia Type:General  Level of Consciousness: awake, alert  and patient cooperative  Airway & Oxygen Therapy: Patient Spontanous Breathing and Patient connected to face mask oxygen  Post-op Assessment: Report given to RN and Post -op Vital signs reviewed and stable  Post vital signs: Reviewed and stable  Last Vitals:  Vitals Value Taken Time  BP 158/73 12/12/19 1340  Temp    Pulse 80 12/12/19 1343  Resp 25 12/12/19 1343  SpO2 100 % 12/12/19 1343  Vitals shown include unvalidated device data.  Last Pain:  Vitals:   12/12/19 1020  TempSrc: Oral  PainSc: 0-No pain         Complications: No apparent anesthesia complications

## 2019-12-13 ENCOUNTER — Encounter: Payer: Self-pay | Admitting: *Deleted

## 2019-12-13 DIAGNOSIS — N401 Enlarged prostate with lower urinary tract symptoms: Secondary | ICD-10-CM | POA: Diagnosis not present

## 2019-12-13 LAB — MRSA PCR SCREENING: MRSA by PCR: NEGATIVE

## 2019-12-13 MED ORDER — SULFAMETHOXAZOLE-TRIMETHOPRIM 800-160 MG PO TABS: 1.0000 | ORAL_TABLET | Freq: Two times a day (BID) | ORAL | Status: DC

## 2019-12-13 NOTE — Discharge Summary (Signed)
Patient ID: [name] MRN: [id number] DOB/AGE: 01-15-1951 69 y.o.  Admit date: 12/12/2019 Discharge date: 12/13/2019  Primary Care Physician:  Azzie Glatter, FNP  Discharge Diagnoses:  BPH w/ incomplete bladder emptying Present on Admission: . Hyperplasia of prostate with urinary obstruction   Consults:  None   Discharge Medications: Allergies as of 12/13/2019      Reactions   Antihistamines, Chlorpheniramine-type Other (See Comments)   Per MAR/per pt, family members did not want him to take these meds/ no allergies   Antihistamines, Diphenhydramine-type Other (See Comments)   Per MAR   Antihistamines, Loratadine-type Other (See Comments)   Per MAR   Ethanolamine Other (See Comments)   Per MAR      Medication List    STOP taking these medications   aspirin EC 81 MG tablet   tamsulosin 0.4 MG Caps capsule Commonly known as: FLOMAX     TAKE these medications   benztropine 0.5 MG tablet Commonly known as: COGENTIN Take 0.5 mg by mouth 2 (two) times daily.   buPROPion 300 MG 24 hr tablet Commonly known as: WELLBUTRIN XL Take 300 mg by mouth daily.   CertaVite/Antioxidants Tabs Take 1 tablet by mouth daily.   clonazePAM 0.5 MG tablet Commonly known as: KLONOPIN Take 0.5 mg by mouth at bedtime.   clonazePAM 0.25 MG disintegrating tablet Commonly known as: KLONOPIN Take 0.25 mg by mouth daily.   Creon 24000-76000 units Cpep Generic drug: Pancrelipase (Lip-Prot-Amyl) Take 3 capsules by mouth 3 (three) times daily with meals.   divalproex 250 MG 24 hr tablet Commonly known as: DEPAKOTE ER Take 250 mg by mouth every evening.   divalproex 500 MG 24 hr tablet Commonly known as: DEPAKOTE ER Take 1,000 mg by mouth every evening.   Fleet Oil enema Generic drug: mineral oil Place 1 enema rectally daily as needed for severe constipation.   GenTeal Tears Night-Time Oint Apply 1 application to eye at bedtime. Pull down lower lid and apply a thin ribbon    REFRESH P.M. OP Place 1 application into the right eye every 2 (two) hours while awake.   Ingrezza 40 MG Caps Generic drug: Valbenazine Tosylate Take 40 mg by mouth at bedtime.   insulin detemir 100 UNIT/ML injection Commonly known as: LEVEMIR Inject 15 Units into the skin at bedtime.   levocetirizine 5 MG tablet Commonly known as: XYZAL Take 5 mg by mouth daily.   levothyroxine 150 MCG tablet Commonly known as: SYNTHROID Take 150 mcg by mouth daily before breakfast.   metFORMIN 500 MG tablet Commonly known as: GLUCOPHAGE Take 1,000 mg by mouth in the morning and at bedtime.   Nutritional Supplement Liqd Take 1 Bottle by mouth with breakfast, with lunch, and with evening meal. Mighty Shakes   Glucose Management Tabs Take 1 tablet by mouth as needed (blood sugar below 60).   OcuSoft Lid Scrub Plus Pads Place 1 each into both eyes 2 (two) times daily.   OLANZapine 20 MG tablet Commonly known as: ZYPREXA Take 20 mg by mouth at bedtime.   polyethylene glycol 17 g packet Commonly known as: MIRALAX / GLYCOLAX Take 17 g by mouth daily.   pregabalin 75 MG capsule Commonly known as: LYRICA Take 75 mg by mouth 2 (two) times daily.   ramipril 2.5 MG capsule Commonly known as: ALTACE Take 5 mg by mouth daily.   simvastatin 40 MG tablet Commonly known as: ZOCOR Take 40 mg by mouth at bedtime.   sulfamethoxazole-trimethoprim 800-160 MG  tablet Commonly known as: BACTRIM DS Take 1 tablet by mouth 2 (two) times daily.   Systane Ultra 0.4-0.3 % Soln Generic drug: Polyethyl Glycol-Propyl Glycol Place 1 drop into the left eye See admin instructions. Instill 1 drop into the left eye 4 to 6 times a day        Significant Diagnostic Studies:  No results found.  Brief H and P: For complete details please refer to admission H and P, but in brief pt admitted for TURP  Hospital Course: No postop issues Active Problems:   Hyperplasia of prostate with urinary  obstruction   Day of Discharge BP (!) 189/75 (BP Location: Right Arm)   Pulse 91   Temp 97.9 F (36.6 C) (Oral)   Resp 14   Ht 6' (1.829 m)   Wt 80.7 kg   SpO2 97%   BMI 24.11 kg/m   Results for orders placed or performed during the hospital encounter of 12/12/19 (from the past 24 hour(s))  Respiratory Panel by RT PCR (Flu A&B, Covid) - Nasopharyngeal Swab     Status: None   Collection Time: 12/12/19  9:48 AM   Specimen: Nasopharyngeal Swab  Result Value Ref Range   SARS Coronavirus 2 by RT PCR NEGATIVE NEGATIVE   Influenza A by PCR NEGATIVE NEGATIVE   Influenza B by PCR NEGATIVE NEGATIVE  Basic metabolic panel     Status: Abnormal   Collection Time: 12/12/19  9:51 AM  Result Value Ref Range   Sodium 141 135 - 145 mmol/L   Potassium 4.2 3.5 - 5.1 mmol/L   Chloride 108 98 - 111 mmol/L   CO2 25 22 - 32 mmol/L   Glucose, Bld 138 (H) 70 - 99 mg/dL   BUN 24 (H) 8 - 23 mg/dL   Creatinine, Ser 1.06 (H) 0.61 - 1.24 mg/dL   Calcium 8.8 (L) 8.9 - 10.3 mg/dL   GFR calc non Af Amer 53 (L) >60 mL/min   GFR calc Af Amer >60 >60 mL/min   Anion gap 8 5 - 15  CBC     Status: Abnormal   Collection Time: 12/12/19  9:51 AM  Result Value Ref Range   WBC 7.8 4.0 - 10.5 K/uL   RBC 3.33 (L) 4.22 - 5.81 MIL/uL   Hemoglobin 10.5 (L) 13.0 - 17.0 g/dL   HCT 26.9 (L) 48.5 - 46.2 %   MCV 97.9 80.0 - 100.0 fL   MCH 31.5 26.0 - 34.0 pg   MCHC 32.2 30.0 - 36.0 g/dL   RDW 70.3 50.0 - 93.8 %   Platelets 241 150 - 400 K/uL   nRBC 0.0 0.0 - 0.2 %  Glucose, capillary     Status: Abnormal   Collection Time: 12/12/19 10:14 AM  Result Value Ref Range   Glucose-Capillary 129 (H) 70 - 99 mg/dL  Glucose, capillary     Status: Abnormal   Collection Time: 12/12/19  1:44 PM  Result Value Ref Range   Glucose-Capillary 101 (H) 70 - 99 mg/dL  Glucose, capillary     Status: Abnormal   Collection Time: 12/12/19  5:16 PM  Result Value Ref Range   Glucose-Capillary 189 (H) 70 - 99 mg/dL  Glucose, capillary      Status: Abnormal   Collection Time: 12/12/19  9:09 PM  Result Value Ref Range   Glucose-Capillary 296 (H) 70 - 99 mg/dL    Physical Exam: General: Alert and awake oriented x3 not in any acute distress. HEENT: anicteric sclera,  pupils reactive to light and accommodation CVS: S1-S2 clear no murmur rubs or gallops Chest: clear to auscultation bilaterally, no wheezing rales or rhonchi Abdomen: soft nontender, nondistended, normal bowel sounds, no organomegaly Extremities: no cyanosis, clubbing or edema noted bilaterally Neuro: Cranial nerves II-XII intact, no focal neurological deficits  Disposition:  SNF  Diet:  Rregular  Activity:  No restrictions   Disposition and Follow-up:  Discharge Instructions    Diet general   Complete by: As directed    Increase activity slowly   Complete by: As directed          TESTS THAT NEED FOLLOW-UP   N/A  DISCHARGE FOLLOW-UP    Time spent on Discharge:   10 mins  Signed: Bertram Millard Antionette Luster 12/13/2019, 7:44 AM

## 2019-12-13 NOTE — Discharge Instructions (Signed)
Transurethral Resection of the Prostate  Care After  Refer to this sheet in the next few weeks. These discharge instructions provide you with general information on caring for yourself after you leave the hospital. Your caregiver may also give you specific instructions. Your treatment has been planned according to the most current medical practices available, but unavoidable complications sometimes occur. If you have any problems or questions after discharge, please call your caregiver.  HOME CARE INSTRUCTIONS   Medications  You may receive medicine for pain management. As your level of discomfort decreases, adjustments in your pain medicines may be made.   Take all medicines as directed.   You may be given a medicine (antibiotic) to kill germs following surgery. Finish all medicines. Let your caregiver know if you have any side effects or problems from the medicine.   If you are on aspirin, it would be best not to restart the aspirin until the blood in the urine clears Hygiene  You can take a shower after surgery.   You should not take a bath while you still have the urethral catheter. Activity  You will be encouraged to get out of bed as much as possible and increase your activity level as tolerated.   Spend the first week in and around your home. For 3 weeks, avoid the following:   Straining.   Running.   Strenuous work.   Walks longer than a few blocks.   Riding for extended periods.   Sexual relations.   Do not lift heavy objects (more than 20 pounds) for at least 1 month. When lifting, use your arms instead of your abdominal muscles.   You will be encouraged to walk as tolerated. Do not exert yourself. Increase your activity level slowly. Remember that it is important to keep moving after an operation of any type. This cuts down on the possibility of developing blood clots.   Your caregiver will tell you when you can resume driving and light housework. Discuss this  at your first office visit after discharge. Diet  No special diet is ordered after a TURP. However, if you are on a special diet for another medical problem, it should be continued.   Normal fluid intake is usually recommended.   Avoid alcohol and caffeinated drinks for 2 weeks. They irritate the bladder. Decaffeinated drinks are okay.   Avoid spicy foods.  Bladder Function  For the first 10 days, empty the bladder whenever you feel a definite desire. Do not try to hold the urine for long periods of time.   Urinating once or twice a night even after you are healed is not uncommon.   You may see some recurrence of blood in the urine after discharge from the hospital. This usually happens within 2 weeks after the procedure.If this occurs, force fluids again as you did in the hospital and reduce your activity.  Bowel Function  You may experience some constipation after surgery. This can be minimized by increasing fluids and fiber in your diet. Drink enough water and fluids to keep your urine clear or pale yellow.   A stool softener may be prescribed for use at home. Do not strain to move your bowels.   If you are requiring increased pain medicine, it is important that you take stool softeners to prevent constipation. This will help to promote proper healing by reducing the need to strain to move your bowels.  Sexual Activity  Semen movement in the opposite direction and into the bladder (  retrograde ejaculation) may occur. Since the semen passes into the bladder, cloudy urine can occur the first time you urinate after intercourse. Or, you may not have an ejaculation during erection. Ask your caregiver when you can resume sexual activity. Retrograde ejaculation and reduced semen discharge should not reduce one's pleasure of intercourse.  Postoperative Visit  Arrange the date and time of your after surgery visit with your caregiver.  Return to Work  After your recovery is complete, you will  be able to return to work and resume all activities. Your caregiver will inform you when you can return to work.  Foley Catheter Care A soft, flexible tube (Foley catheter) may have been placed in your bladder to drain urine and fluid. Follow these instructions: Taking Care of the Catheter  Keep the area where the catheter leaves your body clean.   Attach the catheter to the leg so there is no tension on the catheter.   Keep the drainage bag below the level of the bladder, but keep it OFF the floor.   Do not take long soaking baths. Your caregiver will give instructions about showering.   Wash your hands before touching ANYTHING related to the catheter or bag.   Using mild soap and warm water on a washcloth:   Clean the area closest to the catheter insertion site using a circular motion around the catheter.   Clean the catheter itself by wiping AWAY from the insertion site for several inches down the tube.   NEVER wipe upward as this could sweep bacteria up into the urethra (tube in your body that normally drains the bladder) and cause infection.   Place a small amount of sterile lubricant at the tip of the penis where the catheter is entering.  Taking Care of the Drainage Bags  Two drainage bags may be taken home: a large overnight drainage bag, and a smaller leg bag which fits underneath clothing.   It is okay to wear the overnight bag at any time, but NEVER wear the smaller leg bag at night.   Keep the drainage bag well below the level of your bladder. This prevents backflow of urine into the bladder and allows the urine to drain freely.   Anchor the tubing to your leg to prevent pulling or tension on the catheter. Use tape or a leg strap provided by the hospital.   Empty the drainage bag when it is 1/2 to 3/4 full. Wash your hands before and after touching the bag.   Periodically check the tubing for kinks to make sure there is no pressure on the tubing which could restrict  the flow of urine.  Changing the Drainage Bags  Cleanse both ends of the clean bag with alcohol before changing.   Pinch off the rubber catheter to avoid urine spillage during the disconnection.   Disconnect the dirty bag and connect the clean one.   Empty the dirty bag carefully to avoid a urine spill.   Attach the new bag to the leg with tape or a leg strap.  Cleaning the Drainage Bags  Whenever a drainage bag is disconnected, it must be cleaned quickly so it is ready for the next use.   Wash the bag in warm, soapy water.   Rinse the bag thoroughly with warm water.   Soak the bag for 30 minutes in a solution of white vinegar and water (1 cup vinegar to 1 quart warm water).   Rinse with warm water.  [place]  CARE IF:   You have chills or night sweats.   You are leaking around your catheter or have problems with your catheter. It is not uncommon to have sporadic leakage around your catheter as a result of bladder spasms. If the leakage stops, there is not much need for concern. If you are uncertain, call your caregiver.   You develop side effects that you think are coming from your medicines.  SEEK IMMEDIATE MEDICAL CARE IF:   You are suddenly unable to urinate. Check to see if there are any kinks in the drainage tubing that may cause this. If you cannot find any kinks, call your caregiver immediately. This is an emergency.   You develop shortness of breath or chest pains.   Bleeding persists or clots develop in your urine.   You have a fever.   You develop pain in your back or over your lower belly (abdomen).   You develop pain or swelling in your legs.   Any problems you are having get worse rather than better.  MAKE SURE YOU:   Understand these instructions.   Will watch your condition.   Will get help right away if you are not doing well or get worse.  Document Released: 08/15/2005 Document Revised: 04/27/2011 Document Reviewed: 04/08/2009 [facility]  Patient Information 2012 [place], [place].Transurethral Resection of the Prostate Care After Refer to this sheet in the next few weeks. These discharge instructions provide you with general information on caring for yourself after you leave the hospital. Your caregiver may also give you specific instructions. Your treatment has been planned according to the most current medical practices available, but unavoidable complications sometimes occur. If you have any problems or questions after discharge, please call your caregiver.  It is OK to restart aspirin in 2 weeks

## 2019-12-13 NOTE — TOC Transition Note (Signed)
Transition of Care [facility]) - CM/SW Discharge Note   Patient Details  Name: MYKA LUKINS MRN: [id number] Date of Birth: 23-Dec-1950  [facility] CM/SW Contact:  Darleene Cleaver, LCSW Phone Number: 12/13/2019, 12:14 PM   Clinical Narrative:     Patient is a 69 year old male from [place]. [facility].  CSW was informed that patient is ready for discharge today.  CSW contacted [facility] at [phone], and spoke to Velna Hatchet, to inform her that patient is ready for discharge today.  CSW was informed that they can pick patient up today, and to have the bedside nurse call once he is ready for discharge.  CSW noticed that patient has a legal guardian through [facility], [phone], CSW contacted her to inform her that patient is discharging today.  CSW offered to fax DC summary and FL2 to her, she said to fax it to [phone].  CSW faxed requested information to DSS.  CSW to facilitate discharge planning.    Final next level of care: Assisted Living Barriers to Discharge: Barriers Resolved   Patient Goals and CMS Choice Patient states their goals for this hospitalization and ongoing recovery are:: To return back to [facility]. [facility] CMS Medicare.gov Compare Post Acute Care list provided to:: Patient Represenative (must comment) Choice offered to / list presented to : [facility] POA / Guardian  Discharge Placement                       Discharge Plan and Services                  DME Agency: NA       HH Arranged: NA          Social Determinants of Health (SDOH) Interventions     Readmission Risk Interventions No flowsheet data found.

## 2019-12-13 NOTE — NC FL2 (Addendum)
[place] LEVEL OF CARE SCREENING TOOL     IDENTIFICATION  Patient Name: Ralph [name] Birthdate: [birth year] Sex: male Admission Date (Current Location): 12/12/2019  [place] and [place] Number:  Ralph [name] [id number] [place] and Address:  [facility],  [place], [place]      Provider Number: [id number]  Attending Physician Name and Address:  Franchot Gallo, MD  Relative Name and Phone Number:  [name], [name] [phone]  [phone] and [place] [phone]  [phone]    Current Level of Care: Hospital Recommended Level of Care: [place] Prior Approval Number:    Date Approved/Denied:   PASRR Number:    Discharge Plan: Domiciliary (Rest home)([facility])    Current Diagnoses: Patient Active Problem List   Diagnosis Date Noted  . Hyperplasia of prostate with urinary obstruction 12/12/2019  . Malnutrition of moderate degree 11/26/2016  . Septic arthritis of foot ([place]) 11/26/2016  . Open wound of left foot 11/25/2016  . Thyroid disease   . Hypertension   . Diabetes mellitus without complication ([place])   . Depression   . BPH (benign prostatic hyperplasia)   . Anxiety     Orientation RESPIRATION BLADDER Height & Weight     Self, Time, Situation, Place  Normal Continent Weight: 177 lb 12.8 oz (80.7 kg) Height:  6' (182.9 cm)  BEHAVIORAL SYMPTOMS/MOOD NEUROLOGICAL BOWEL NUTRITION STATUS      Continent Diet(Regular)  AMBULATORY STATUS COMMUNICATION OF NEEDS Skin   Supervision Verbally Normal                       Personal Care Assistance Level of Assistance  Bathing, Dressing, Feeding Bathing Assistance: Limited assistance Feeding assistance: Independent Dressing Assistance: Limited assistance     Functional Limitations Info  Sight, Speech, Hearing Sight Info: Adequate Hearing Info: Adequate Speech Info: Adequate    SPECIAL CARE FACTORS FREQUENCY                        Contractures Contractures Info: Not present    Additional Factors Info  Code Status, Allergies, Psychotropic Code Status Info: Full Code Allergies Info: Antihistamines, Chlorpheniramine-type Antihistamines, Diphenhydramine-type Antihistamines, Loratadine-type Ethanolamine Psychotropic Info: buPROPion (WELLBUTRIN XL) 24 hr tablet 300 mg         Current Medications (12/13/2019):  This is the current hospital active medication list Current Facility-Administered Medications  Medication Dose Route Frequency Provider Last Rate Last Admin  . 0.45 % sodium chloride infusion   Intravenous Continuous Franchot Gallo, MD 75 mL/hr at 12/12/19 1724 New Bag at 12/12/19 1724  . acetaminophen (TYLENOL) tablet 650 mg  650 mg Oral Q4H PRN Franchot Gallo, MD      . artificial tears (LACRILUBE) ophthalmic ointment   Both Eyes QHS Franchot Gallo, MD   Given at 12/12/19 2206  . benztropine (COGENTIN) tablet 0.5 mg  0.5 mg Oral BID Franchot Gallo, MD   0.5 mg at 12/13/19 0850  . buPROPion (WELLBUTRIN XL) 24 hr tablet 300 mg  300 mg Oral Daily Franchot Gallo, MD   300 mg at 12/13/19 0851  . cetirizine (ZYRTEC) tablet 10 mg  10 mg Oral Daily Franchot Gallo, MD   10 mg at 12/13/19 0851  . clonazepam (KLONOPIN) disintegrating tablet 0.25 mg  0.25 mg Oral Daily Franchot Gallo, MD   0.25 mg at 12/13/19 0851  . clonazePAM (KLONOPIN) tablet 0.5 mg  0.5 mg Oral QHS Franchot Gallo, MD  0.5 mg at 12/12/19 2205  . divalproex (DEPAKOTE ER) 24 hr tablet 1,000 mg  1,000 mg Oral QPM Marcine Matar, MD   1,000 mg at 12/12/19 1738  . divalproex (DEPAKOTE ER) 24 hr tablet 250 mg  250 mg Oral QPM Marcine Matar, MD   250 mg at 12/12/19 1734  . feeding supplement (ENSURE ENLIVE) (ENSURE ENLIVE) liquid 237 mL  237 mL Oral TID BM Marcine Matar, MD   237 mL at 12/13/19 0852  . HYDROcodone-acetaminophen (NORCO/VICODIN) 5-325 MG per tablet 1-2 tablet  1-2 tablet Oral  Q4H PRN Marcine Matar, MD      . insulin detemir (LEVEMIR) injection 15 Units  15 Units Subcutaneous QHS Marcine Matar, MD   15 Units at 12/12/19 2222  . levothyroxine (SYNTHROID) tablet 150 mcg  150 mcg Oral QAC breakfast Marcine Matar, MD   150 mcg at 12/13/19 0552  . lipase/protease/amylase (CREON) capsule 12,000 Units  12,000 Units Oral TID WC Marcine Matar, MD   12,000 Units at 12/13/19 [phone]  . metFORMIN (GLUCOPHAGE) tablet 1,000 mg  1,000 mg Oral Q breakfast Marcine Matar, MD   1,000 mg at 12/13/19 0851  . mineral oil enema 1 enema  1 enema Rectal Daily PRN Marcine Matar, MD      . multivitamin with minerals tablet 1 tablet  1 tablet Oral Daily Marcine Matar, MD   1 tablet at 12/13/19 0851  . OcuSoft Lid Scrub Plus PADS 1 each  1 each Both Eyes BID Marcine Matar, MD   1 each at 12/13/19 [phone]  . OLANZapine (ZYPREXA) tablet 20 mg  20 mg Oral QHS Marcine Matar, MD   20 mg at 12/12/19 2205  . opium-belladonna (B&O) suppository 16.2-60mg   1 suppository Rectal Q6H PRN Marcine Matar, MD      . polyethylene glycol (MIRALAX / GLYCOLAX) packet 17 g  17 g Oral Daily Marcine Matar, MD   17 g at 12/13/19 0850  . polyvinyl alcohol (LIQUIFILM TEARS) 1.4 % ophthalmic solution 1 drop  1 drop Left Eye PRN Marcine Matar, MD   1 drop at 12/12/19 2206  . pregabalin (LYRICA) capsule 75 mg  75 mg Oral BID Marcine Matar, MD   75 mg at 12/13/19 0850  . ramipril (ALTACE) capsule 5 mg  5 mg Oral Daily Marcine Matar, MD   5 mg at 12/13/19 0851  . senna (SENOKOT) tablet 8.6 mg  1 tablet Oral BID Marcine Matar, MD   8.6 mg at 12/13/19 0851  . simvastatin (ZOCOR) tablet 40 mg  40 mg Oral QHS Marcine Matar, MD   40 mg at 12/12/19 2205  . sodium chloride irrigation 0.9 % 3,000 mL  3,000 mL Irrigation Continuous Marcine Matar, MD   3,000 mL at 12/12/19 1806  . sulfamethoxazole-trimethoprim (BACTRIM DS) 800-160 MG per tablet 1 tablet  1 tablet  Oral Q12H Marcine Matar, MD   1 tablet at 12/13/19 0851  . Valbenazine Tosylate CAPS 40 mg  40 mg Oral QHS Marcine Matar, MD      . zolpidem (AMBIEN) tablet 5 mg  5 mg Oral QHS PRN Marcine Matar, MD         Discharge Medications:  STOP taking these medications   aspirin EC 81 MG tablet   tamsulosin 0.4 MG Caps capsule Commonly known as: FLOMAX     TAKE these medications   benztropine 0.5 MG tablet Commonly known as: COGENTIN Take 0.5 mg by mouth 2 (two) times daily.   buPROPion 300 MG 24 hr  tablet Commonly known as: WELLBUTRIN XL Take 300 mg by mouth daily.   CertaVite/Antioxidants Tabs Take 1 tablet by mouth daily.   clonazePAM 0.5 MG tablet Commonly known as: KLONOPIN Take 0.5 mg by mouth at bedtime.   clonazePAM 0.25 MG disintegrating tablet Commonly known as: KLONOPIN Take 0.25 mg by mouth daily.   Creon 24000-76000 units Cpep Generic drug: Pancrelipase (Lip-Prot-Amyl) Take 3 capsules by mouth 3 (three) times daily with meals.   divalproex 250 MG 24 hr tablet Commonly known as: DEPAKOTE ER Take 250 mg by mouth every evening.   divalproex 500 MG 24 hr tablet Commonly known as: DEPAKOTE ER Take 1,000 mg by mouth every evening.   Fleet Oil enema Generic drug: mineral oil Place 1 enema rectally daily as needed for severe constipation.   GenTeal Tears Night-Time Oint Apply 1 application to eye at bedtime. Pull down lower lid and apply a thin ribbon   REFRESH P.M. OP Place 1 application into the right eye every 2 (two) hours while awake.   Ingrezza 40 MG Caps Generic drug: Valbenazine Tosylate Take 40 mg by mouth at bedtime.   insulin detemir 100 UNIT/ML injection Commonly known as: LEVEMIR Inject 15 Units into the skin at bedtime.   levocetirizine 5 MG tablet Commonly known as: XYZAL Take 5 mg by mouth daily.   levothyroxine 150 MCG tablet Commonly known as: SYNTHROID Take 150 mcg by mouth daily before breakfast.    metFORMIN 500 MG tablet Commonly known as: GLUCOPHAGE Take 1,000 mg by mouth in the morning and at bedtime.   Nutritional Supplement Liqd Take 1 Bottle by mouth with breakfast, with lunch, and with evening meal. Mighty Shakes   Glucose Management Tabs Take 1 tablet by mouth as needed (blood sugar below 60).   OcuSoft Lid Scrub Plus Pads Place 1 each into both eyes 2 (two) times daily.   OLANZapine 20 MG tablet Commonly known as: ZYPREXA Take 20 mg by mouth at bedtime.   polyethylene glycol 17 g packet Commonly known as: MIRALAX / GLYCOLAX Take 17 g by mouth daily.   pregabalin 75 MG capsule Commonly known as: LYRICA Take 75 mg by mouth 2 (two) times daily.   ramipril 2.5 MG capsule Commonly known as: ALTACE Take 5 mg by mouth daily.   simvastatin 40 MG tablet Commonly known as: ZOCOR Take 40 mg by mouth at bedtime.   sulfamethoxazole-trimethoprim 800-160 MG tablet Commonly known as: BACTRIM DS Take 1 tablet by mouth 2 (two) times daily.   Systane Ultra 0.4-0.3 % Soln Generic drug: Polyethyl Glycol-Propyl Glycol Place 1 drop into the left eye See admin instructions. Instill 1 drop into the left eye 4 to 6 times a day     Relevant Imaging Results:  Relevant Lab Results:   Additional Information SSN [id number]  Darleene Cleaver, LCSW

## 2019-12-13 NOTE — Progress Notes (Signed)
Called [facility] and spoke with Velna Hatchet and resident care Interior and spatial designer, [place] RE: discharge, follow up, and medication instructions, verbalized understanding, IV removed, personal belongings with patient, facility to transport home

## 2019-12-14 NOTE — Anesthesia Postprocedure Evaluation (Signed)
Anesthesia Post Note  Patient: [name]  Procedure(s) Performed: TRANSURETHRAL RESECTION OF THE PROSTATE (TURP) (N/A )     Patient location during evaluation: PACU Anesthesia Type: General Level of consciousness: sedated Pain management: pain level controlled Vital Signs Assessment: post-procedure vital signs reviewed and stable Respiratory status: spontaneous breathing and respiratory function stable Cardiovascular status: stable Postop Assessment: no apparent nausea or vomiting Anesthetic complications: no               Ralph [name]

## 2019-12-15 LAB — SURGICAL PATHOLOGY

## 2020-04-15 ENCOUNTER — Ambulatory Visit (INDEPENDENT_AMBULATORY_CARE_PROVIDER_SITE_OTHER): Payer: Medicare Other | Admitting: Neurology

## 2020-04-15 ENCOUNTER — Encounter: Payer: Self-pay | Admitting: Neurology

## 2020-04-15 VITALS — BP 192/93 | HR 86 | Ht 72.0 in | Wt 184.0 lb

## 2020-04-15 DIAGNOSIS — G5632 Lesion of radial nerve, left upper limb: Secondary | ICD-10-CM | POA: Diagnosis not present

## 2020-04-15 NOTE — Patient Instructions (Addendum)
I believe, you have weakness of your Left wrist from a pinched nerve in your upper arm, called radial nerve palsy.  I would like to proceed with electrical nerve and muscle testing called EMG nerve conduction velocity testing through our office, we will schedule you on your way out today.  You reported having had x-rays of your left wrist, I could not find the results.  In addition, I will make a referral to occupational therapy so they can work with you on better usage of your left hand and perhaps fit you with a splint.  I believe he will get better over time. We will have you follow-up in about 3 months to see one of our nurse practitioners.  In the meantime, we will keep you posted as to your test results by phone call.

## 2020-04-15 NOTE — Progress Notes (Signed)
Subjective:    Patient ID: Ralph [name] is a 69 y.o. male.  HPI     History:   Dear Dr. Thomasena Edis,   I saw your patient, Ralph [name], upon your kind request, in my Neurologic clinic today for initial consultation of his left wrist drop.  The patient is accompanied by Riki Sheer, a caretaker from his assisted living facility, [facility], today.  As you know, Ralph [name] is a 69 year old right-handed gentleman with an underlying complex medical history of hypertension, hyperlipidemia, diabetes, neuropathy, hypothyroidism, depression, anxiety, schizophrenia by chart review, allergic rhinitis, BPH, osteoarthritis, and pancreatic insufficiency, who reports an approximately 2-week history of swelling of the left hand and weakness in the wrist.  I reviewed your office note from 04/09/2020.  He reports that he had an x-ray but the results are not available for my review today.  He reports that the swelling has improved, his caretaker also endorses that swelling is much better but he still has weakness in the wrist.  He denies any sudden onset of left-sided weakness in general, no facial weakness, no arm weakness and no leg weakness.  He has noticed some numbness in the left palm.  He has trouble with fine motor skills such as buttoning or using his fingers for any fine motor activities.  He is on a number of medications.  He has had a tremor in both upper extremities.  He is followed by psychiatry.  He reports that he woke up with the left wrist drop, he does recall having slept on the left arm at the time.  He is not in any significant pain.  His Past Medical History Is Significant For: Past Medical History:  Diagnosis Date  . Allergic rhinitis   . Anxiety   . Arthritis    left hip per pt.  Marland Kitchen BPH (benign prostatic hyperplasia)   . Cataract    had surgery  . Chronic constipation   . Depression   . Diabetes mellitus without complication (HCC)    type 2  . Heart murmur    slight  . Hyperlipidemia    . Hypertension   . Hypothyroidism   . Neuropathy   . Osteoarthritis   . Pancreatic insufficiency   . Peripheral arterial disease (HCC)   . Schizophrenia (HCC)    paranoid   . Thyroid disease    hypothyroid  . Urinary incontinence     His Past Surgical History Is Significant For: Past Surgical History:  Procedure Laterality Date  . AMPUTATION Left 11/26/2016   Procedure: RAY AMPUTATION LEFT FIFTH TOE;  Surgeon: Beverely Low, MD;  Location: [facility] OR;  Service: Orthopedics;  Laterality: Left;  . CATARACT EXTRACTION     Bil with implants  . COLONOSCOPY     per pt/ had 2 colon but not sure when.  . COLONOSCOPY    . EYE SURGERY     Bil radial orbital decompression surg / per pt eye socket surgery./ has had 10 surgeries  . Left [facility] PTS/Stent 2018- Dr Edilia Bo     . LOWER EXTREMITY ANGIOGRAPHY N/A 11/28/2016   Procedure: Lower Extremity Angiography;  Surgeon: Chuck Hint, MD;  Location: [facility] INVASIVE CV LAB;  Service: Cardiovascular;  Laterality: N/A;  . PERIPHERAL VASCULAR INTERVENTION Left 11/28/2016   Procedure: Peripheral Vascular Intervention;  Surgeon: Chuck Hint, MD;  Location: [facility] INVASIVE CV LAB;  Service: Cardiovascular;  Laterality: Left;  common iliac  . thyroid ablation    . TONSILLECTOMY  as a child  . TRANSURETHRAL RESECTION OF PROSTATE N/A 12/12/2019   Procedure: TRANSURETHRAL RESECTION OF THE PROSTATE (TURP);  Surgeon: Marcine Matar, MD;  Location: [facility] ORS;  Service: Urology;  Laterality: N/A;  75 MINS    His Family History Is Significant For: Family History  Problem Relation Age of Onset  . Diabetes Mellitus II Mother   . Heart disease Father   . Lung cancer Father   . Diabetes Mellitus II Paternal Grandfather   . Diabetes Paternal Grandfather   . Diabetes Brother     His Social History Is Significant For: Social History   Socioeconomic History  . Marital status: Married    Spouse name: Not on file  . Number of children: Not on file   . Years of education: Not on file  . Highest education level: Not on file  Occupational History  . Not on file  Tobacco Use  . Smoking status: Former Games developer  . Smokeless tobacco: Never Used  Substance and Sexual Activity  . Alcohol use: No  . Drug use: No  . Sexual activity: Not on file  Other Topics Concern  . Not on file  Social History Narrative  . Not on file   Social Determinants of Health   Financial Resource Strain:   . Difficulty of Paying Living Expenses:   Food Insecurity:   . Worried About Programme researcher, broadcasting/film/video in the Last Year:   . Barista in the Last Year:   Transportation Needs:   . Freight forwarder (Medical):   Marland Kitchen Lack of Transportation (Non-Medical):   Physical Activity:   . Days of Exercise per Week:   . Minutes of Exercise per Session:   Stress:   . Feeling of Stress :   Social Connections:   . Frequency of Communication with Friends and Family:   . Frequency of Social Gatherings with Friends and Family:   . Attends Religious Services:   . Active Member of Clubs or Organizations:   . Attends Banker Meetings:   Marland Kitchen Marital Status:     His Allergies Are:  Allergies  Allergen Reactions  . Antihistamines, Chlorpheniramine-Type Other (See Comments)    Per MAR/per pt, family members did not want him to take these meds/ no allergies  . Antihistamines, Diphenhydramine-Type Other (See Comments)    Per MAR  . Antihistamines, Loratadine-Type Other (See Comments)    Per MAR  . Ethanolamine Other (See Comments)    Per MAR  :   His Current Medications Are:  Outpatient Encounter Medications as of 04/15/2020  Medication Sig  . benztropine (COGENTIN) 0.5 MG tablet Take 0.5 mg by mouth 2 (two) times daily.  Marland Kitchen buPROPion (WELLBUTRIN XL) 300 MG 24 hr tablet Take 300 mg by mouth daily.  . clonazePAM (KLONOPIN) 0.25 MG disintegrating tablet Take 0.25 mg by mouth daily.  . clonazePAM (KLONOPIN) 0.5 MG tablet Take 0.5 mg by mouth at bedtime.    . divalproex (DEPAKOTE ER) 250 MG 24 hr tablet Take 250 mg by mouth every evening.   . divalproex (DEPAKOTE ER) 500 MG 24 hr tablet Take 1,000 mg by mouth every evening.   . Eyelid Cleansers (OCUSOFT LID SCRUB PLUS) PADS Place 1 each into both eyes 2 (two) times daily.  . insulin detemir (LEVEMIR) 100 UNIT/ML injection Inject 15 Units into the skin at bedtime.   Marland Kitchen levocetirizine (XYZAL) 5 MG tablet Take 5 mg by mouth daily.   Marland Kitchen levothyroxine (SYNTHROID) 150  MCG tablet Take 150 mcg by mouth daily before breakfast.  . metFORMIN (GLUCOPHAGE) 500 MG tablet Take 1,000 mg by mouth in the morning and at bedtime.  . mineral oil (FLEET OIL) enema Place 1 enema rectally daily as needed for severe constipation.  . Multiple Vitamins-Minerals (CERTAVITE/ANTIOXIDANTS) TABS Take 1 tablet by mouth daily.  . Nutritional Supplement LIQD Take 1 Bottle by mouth with breakfast, with lunch, and with evening meal. Mighty Shakes  . Nutritional Supplements (GLUCOSE MANAGEMENT) TABS Take 1 tablet by mouth as needed (blood sugar below 60).  . OLANZapine (ZYPREXA) 20 MG tablet Take 20 mg by mouth at bedtime.  . Pancrelipase, Lip-Prot-Amyl, (CREON) 24000-76000 units CPEP Take 3 capsules by mouth 3 (three) times daily with meals.   Bertram Gala. Polyethyl Glycol-Propyl Glycol (SYSTANE ULTRA) 0.4-0.3 % SOLN Place 1 drop into the left eye See admin instructions. Instill 1 drop into the left eye 4 to 6 times a day  . polyethylene glycol (MIRALAX / GLYCOLAX) 17 g packet Take 17 g by mouth daily.  . pregabalin (LYRICA) 75 MG capsule Take 75 mg by mouth 2 (two) times daily.   . ramipril (ALTACE) 2.5 MG capsule Take 5 mg by mouth daily.  . simvastatin (ZOCOR) 40 MG tablet Take 40 mg by mouth at bedtime.  . sulfamethoxazole-trimethoprim (BACTRIM DS) 800-160 MG tablet Take 1 tablet by mouth 2 (two) times daily.  Marcie Bal. Valbenazine Tosylate (INGREZZA) 40 MG CAPS Take 40 mg by mouth at bedtime.  . White Petrolatum-Mineral Oil (GENTEAL TEARS  NIGHT-TIME) OINT Apply 1 application to eye at bedtime. Pull down lower lid and apply a thin ribbon  . White Petrolatum-Mineral Oil (REFRESH P.M. OP) Place 1 application into the right eye every 2 (two) hours while awake.   No facility-administered encounter medications on file as of 04/15/2020.  :  Review of Systems:  Out of a complete 14 point review of systems, all are reviewed and negative with the exception of these symptoms as listed below:  Review of Systems  Neurological:       Pt is here today for left hand weakness, started 2 weeks ago, reports no pain,     Objective:  Neurological Exam  Physical Exam Physical Examination:   Vitals:   04/15/20 1425  BP: (!) 192/93  Pulse: 86    General Examination: The patient is a very pleasant 69 y.o. male in no acute distress. He appears well-developed and well-nourished and adequately groomed.   HEENT: Normocephalic, atraumatic, pupils are equal, round and reactive to light and accommodation.  He has right partial eyelid closure surgically.  He is status post cataract repairs bilaterally.  He has limited vision in the right eye.  He wears corrective eyeglasses.  Funduscopic exam is difficult, tracking is mildly impaired, hearing is grossly normal.  Face is symmetric, airway examination reveals edentulous state on top and he has a few teeth on the bottom, does not have his dentures or partial plate in.  Tongue protrudes centrally in palate elevates symmetrically.  He has no obvious dysarthria.  Neck is supple, no lip, neck or jaw tremor noted.    Chest: Clear to auscultation without wheezing, rhonchi or crackles noted.  Heart: S1+S2+0, regular and normal without murmurs, rubs or gallops noted.   Abdomen: Soft, non-tender and non-distended with normal bowel sounds appreciated on auscultation.  Extremities: There is no pitting edema in the distal lower extremities bilaterally. Pedal pulses are intact.  Skin: Warm and dry without  trophic changes noted.  Musculoskeletal: exam reveals no obvious joint deformities, but he does have mild swelling around the left wrist.  No redness or tenderness.     Neurologically:  Mental status: The patient is awake, alert and oriented in all 4 spheres. His immediate and remote memory, attention, language skills and fund of knowledge are appropriate. There is no evidence of aphasia, agnosia, apraxia or anomia. Speech is clear with normal prosody and enunciation. Thought process is linear. Mood is constricted and affect is normal.  Cranial nerves II - XII are as described above under HEENT exam. In addition: shoulder shrug is normal with equal shoulder height noted. Motor exam: Normal bulk, strength and tone is noted, with the exception of left wrist drop.  He has fairly good strength in the proximal left upper extremity and left lower extremity.  He has no obvious asymmetry and muscle profile.  Reflexes are 2+ throughout, 1+ in the ankles bilaterally.  Toes are downgoing bilaterally.  Sensory exam is intact to light touch, temperature and pinprick with the exception of decreased sensation to temperature and pinprick in the left palm.  Fine motor skills are impaired with finger taps and rapid alternating patting in the left upper extremity but otherwise he has fairly good fine motor skills with foot taps and finger taps on the right upper extremity and both lower extremities.  He does have an intermittent resting tremor in the right upper extremity.   Cerebellar testing: No dysmetria or intention tremor on finger to nose testing.  He stands with mild difficulty, he has a walker and uses it fairly well.  He has mild shuffling when walking.  Assessment and Plan:   In summary, Ralph [name] is a very pleasant 69 y.o.-year old male  with an underlying complex medical history of hypertension, hyperlipidemia, diabetes, neuropathy, hypothyroidism, depression, anxiety, schizophrenia by chart review,  allergic rhinitis, BPH, osteoarthritis, and pancreatic insufficiency, who presents for evaluation of his left wrist drop.  His history and examination are in keeping with radial nerve palsy.  He has no obvious evidence of more widespread left-sided weakness or strokelike symptoms.  He is advised to proceed with EMG nerve conduction velocity testing through our office.  I would also like to refer him to occupational therapy and to be fitted with a left wrist splint.  He is agreeable to this approach.  Thankfully, his swelling has come down and he is not in any significant pain.  He reports having had an x-ray done to the left wrist, please send the report to our office as I could not find the results.  He is advised to follow-up to see one of our nurse practitioners in the next couple of months, we will keep him posted as to his EMG nerve conduction velocity test results by phone call.  I answered all his questions today and he was in agreement.  Thank you very much for allowing me to participate in the care of this nice patient. If I can be of any further assistance to you please do not hesitate to call me at [phone].  Sincerely,   Huston Foley, MD, PhD

## 2020-05-07 ENCOUNTER — Emergency Department (HOSPITAL_COMMUNITY)
Admission: EM | Admit: 2020-05-07 | Discharge: 2020-05-07 | Disposition: A | Discharge status: Home / Self Care | Payer: Medicare Other | Attending: Emergency Medicine | Admitting: Emergency Medicine

## 2020-05-07 ENCOUNTER — Encounter (HOSPITAL_COMMUNITY): Payer: Self-pay | Admitting: Emergency Medicine

## 2020-05-07 ENCOUNTER — Emergency Department (HOSPITAL_COMMUNITY): Payer: Medicare Other

## 2020-05-07 DIAGNOSIS — I10 Essential (primary) hypertension: Secondary | ICD-10-CM | POA: Insufficient documentation

## 2020-05-07 DIAGNOSIS — Z87891 Personal history of nicotine dependence: Secondary | ICD-10-CM | POA: Diagnosis not present

## 2020-05-07 DIAGNOSIS — Y999 Unspecified external cause status: Secondary | ICD-10-CM | POA: Insufficient documentation

## 2020-05-07 DIAGNOSIS — E039 Hypothyroidism, unspecified: Secondary | ICD-10-CM | POA: Diagnosis not present

## 2020-05-07 DIAGNOSIS — Y929 Unspecified place or not applicable: Secondary | ICD-10-CM | POA: Diagnosis not present

## 2020-05-07 DIAGNOSIS — Z7984 Long term (current) use of oral hypoglycemic drugs: Secondary | ICD-10-CM | POA: Insufficient documentation

## 2020-05-07 DIAGNOSIS — Z79899 Other long term (current) drug therapy: Secondary | ICD-10-CM | POA: Diagnosis not present

## 2020-05-07 DIAGNOSIS — Z23 Encounter for immunization: Secondary | ICD-10-CM | POA: Diagnosis not present

## 2020-05-07 DIAGNOSIS — Z79890 Hormone replacement therapy (postmenopausal): Secondary | ICD-10-CM | POA: Insufficient documentation

## 2020-05-07 DIAGNOSIS — Y939 Activity, unspecified: Secondary | ICD-10-CM | POA: Diagnosis not present

## 2020-05-07 DIAGNOSIS — S0101XA Laceration without foreign body of scalp, initial encounter: Secondary | ICD-10-CM | POA: Diagnosis not present

## 2020-05-07 DIAGNOSIS — W19XXXA Unspecified fall, initial encounter: Secondary | ICD-10-CM | POA: Insufficient documentation

## 2020-05-07 DIAGNOSIS — E119 Type 2 diabetes mellitus without complications: Secondary | ICD-10-CM | POA: Insufficient documentation

## 2020-05-07 DIAGNOSIS — S0990XA Unspecified injury of head, initial encounter: Secondary | ICD-10-CM | POA: Diagnosis present

## 2020-05-07 MED ORDER — CLONIDINE HCL 0.1 MG PO TABS
0.1000 mg | ORAL_TABLET | Freq: Once | ORAL | Status: AC
  Administered 2020-05-07: 0.1 mg via ORAL
  Filled 2020-05-07: qty 1

## 2020-05-07 MED ORDER — TETANUS-DIPHTH-ACELL PERTUSSIS 5-2.5-18.5 LF-MCG/0.5 IM SUSP
0.5000 mL | Freq: Once | INTRAMUSCULAR | Status: AC
  Administered 2020-05-07: 0.5 mL via INTRAMUSCULAR
  Filled 2020-05-07: qty 0.5

## 2020-05-07 MED ORDER — LIDOCAINE-EPINEPHRINE-TETRACAINE (LET) TOPICAL GEL
3.0000 mL | Freq: Once | TOPICAL | Status: AC
  Administered 2020-05-07: 3 mL via TOPICAL
  Filled 2020-05-07: qty 3

## 2020-05-07 NOTE — ED Provider Notes (Signed)
[facility]-EMERGENCY DEPT Provider Note   CSN: [id number] Arrival date & time: 05/07/20  1450     History Chief Complaint  Patient presents with  . Fall    Ralph [name] is a 69 y.o. male.  HPI   Patient is a 69 year old male with a medical history as noted below.  Patient lives at [facility].  Patient states that he typically ambulates with a walker.  He states that he was having trouble getting to his walker and stood up and tripped and fell backwards striking his posterior head on the tile floor.  He notes a small laceration with minimal bleeding to the posterior scalp as well as mild pain in the prior mentioned region.  No LOC, dizziness, lightheadedness. Notes some chronic left hand weakness that he feels has not acutely worsened. Evaluated by neurology last month for this.  Pt also reports tremors in his extremities at baseline. Patient is not anticoagulated.  No CP, SOB, fevers, chills, n/v.      Past Medical History:  Diagnosis Date  . Allergic rhinitis   . Anxiety   . Arthritis    left hip per pt.  Marland Kitchen BPH (benign prostatic hyperplasia)   . Cataract    had surgery  . Chronic constipation   . Depression   . Diabetes mellitus without complication (HCC)    type 2  . Heart murmur    slight  . Hyperlipidemia   . Hypertension   . Hypothyroidism   . Neuropathy   . Osteoarthritis   . Pancreatic insufficiency   . Peripheral arterial disease (HCC)   . Schizophrenia (HCC)    paranoid   . Thyroid disease    hypothyroid  . Urinary incontinence    Patient Active Problem List   Diagnosis Date Noted  . Hyperplasia of prostate with urinary obstruction 12/12/2019  . Malnutrition of moderate degree 11/26/2016  . Septic arthritis of foot (HCC) 11/26/2016  . Open wound of left foot 11/25/2016  . Thyroid disease   . Hypertension   . Diabetes mellitus without complication (HCC)   . Depression   . BPH (benign prostatic hyperplasia)   . Anxiety      Past Surgical History:  Procedure Laterality Date  . AMPUTATION Left 11/26/2016   Procedure: RAY AMPUTATION LEFT FIFTH TOE;  Surgeon: Beverely Low, MD;  Location: [facility] OR;  Service: Orthopedics;  Laterality: Left;  . CATARACT EXTRACTION     Bil with implants  . COLONOSCOPY     per pt/ had 2 colon but not sure when.  . COLONOSCOPY    . EYE SURGERY     Bil radial orbital decompression surg / per pt eye socket surgery./ has had 10 surgeries  . Left [facility] PTS/Stent 2018- Dr Edilia Bo     . LOWER EXTREMITY ANGIOGRAPHY N/A 11/28/2016   Procedure: Lower Extremity Angiography;  Surgeon: Chuck Hint, MD;  Location: [facility] INVASIVE CV LAB;  Service: Cardiovascular;  Laterality: N/A;  . PERIPHERAL VASCULAR INTERVENTION Left 11/28/2016   Procedure: Peripheral Vascular Intervention;  Surgeon: Chuck Hint, MD;  Location: [facility] INVASIVE CV LAB;  Service: Cardiovascular;  Laterality: Left;  common iliac  . thyroid ablation    . TONSILLECTOMY     as a child  . TRANSURETHRAL RESECTION OF PROSTATE N/A 12/12/2019   Procedure: TRANSURETHRAL RESECTION OF THE PROSTATE (TURP);  Surgeon: Marcine Matar, MD;  Location: [facility] ORS;  Service: Urology;  Laterality: N/A;  75 MINS  Family History  Problem Relation Age of Onset  . Diabetes Mellitus II Mother   . Heart disease Father   . Lung cancer Father   . Diabetes Mellitus II Paternal Grandfather   . Diabetes Paternal Grandfather   . Diabetes Brother     Social History   Tobacco Use  . Smoking status: Former Games developer  . Smokeless tobacco: Never Used  Substance Use Topics  . Alcohol use: No  . Drug use: No    Home Medications Prior to Admission medications   Medication Sig Start Date End Date Taking? Authorizing Provider  benztropine (COGENTIN) 0.5 MG tablet Take 0.5 mg by mouth 2 (two) times daily.    [provider]  buPROPion (WELLBUTRIN XL) 300 MG 24 hr tablet Take 300 mg by mouth daily.    [provider]   clonazePAM (KLONOPIN) 0.25 MG disintegrating tablet Take 0.25 mg by mouth daily.    [provider]  clonazePAM (KLONOPIN) 0.5 MG tablet Take 0.5 mg by mouth at bedtime.     [provider]  divalproex (DEPAKOTE ER) 250 MG 24 hr tablet Take 250 mg by mouth every evening.     [provider]  divalproex (DEPAKOTE ER) 500 MG 24 hr tablet Take 1,000 mg by mouth every evening.     [provider]  Eyelid Cleansers (OCUSOFT LID SCRUB PLUS) PADS Place 1 each into both eyes 2 (two) times daily.    [provider]  insulin detemir (LEVEMIR) 100 UNIT/ML injection Inject 15 Units into the skin at bedtime.     [provider]  levocetirizine (XYZAL) 5 MG tablet Take 5 mg by mouth daily.     [provider]  levothyroxine (SYNTHROID) 150 MCG tablet Take 150 mcg by mouth daily before breakfast.    [provider]  metFORMIN (GLUCOPHAGE) 500 MG tablet Take 1,000 mg by mouth in the morning and at bedtime.    [provider]  mineral oil (FLEET OIL) enema Place 1 enema rectally daily as needed for severe constipation.    [provider]  Multiple Vitamins-Minerals (CERTAVITE/ANTIOXIDANTS) TABS Take 1 tablet by mouth daily.    [provider]  Nutritional Supplement LIQD Take 1 Bottle by mouth with breakfast, with lunch, and with evening meal. Mighty Shakes    [provider]  Nutritional Supplements (GLUCOSE MANAGEMENT) TABS Take 1 tablet by mouth as needed (blood sugar below 60).    [provider]  OLANZapine (ZYPREXA) 20 MG tablet Take 20 mg by mouth at bedtime.    [provider]  Pancrelipase, Lip-Prot-Amyl, (CREON) 24000-76000 units CPEP Take 3 capsules by mouth 3 (three) times daily with meals.     [provider]  Polyethyl Glycol-Propyl Glycol (SYSTANE ULTRA) 0.4-0.3 % SOLN Place 1 drop into the left eye See admin instructions. Instill 1 drop into the left eye 4 to 6 times  a day    [provider]  polyethylene glycol (MIRALAX / GLYCOLAX) 17 g packet Take 17 g by mouth daily.    [provider]  pregabalin (LYRICA) 75 MG capsule Take 75 mg by mouth 2 (two) times daily.     [provider]  ramipril (ALTACE) 2.5 MG capsule Take 5 mg by mouth daily.    [provider]  simvastatin (ZOCOR) 40 MG tablet Take 40 mg by mouth at bedtime.    [provider]  sulfamethoxazole-trimethoprim (BACTRIM DS) 800-160 MG tablet Take 1 tablet by mouth 2 (  two) times daily. 12/13/19   Marcine Matar, MD  Valbenazine Tosylate [facility]) 40 MG CAPS Take 40 mg by mouth at bedtime.    [provider]  White Petrolatum-Mineral Oil (GENTEAL TEARS NIGHT-TIME) OINT Apply 1 application to eye at bedtime. Pull down lower lid and apply a thin ribbon    [provider]  White Petrolatum-Mineral Oil (REFRESH P.M. OP) Place 1 application into the right eye every 2 (two) hours while awake.    [provider]    Allergies    Antihistamines, chlorpheniramine-type; Antihistamines, diphenhydramine-type; Antihistamines, loratadine-type; and Ethanolamine  Review of Systems   Review of Systems  All other systems reviewed and are negative.  Ten systems reviewed and are negative for acute change, except as noted in the HPI.   Physical Exam Updated Vital Signs BP (!) 188/77 (BP Location: Right Arm)   Pulse 80   Temp 98.8 F (37.1 C) (Oral)   Resp 18   Ht 6' (1.829 m)   Wt 81.6 kg   SpO2 99%   BMI 24.41 kg/m   Physical Exam Vitals and nursing note reviewed.  Constitutional:      General: He is not in acute distress.    Appearance: Normal appearance. He is not ill-appearing, toxic-appearing or diaphoretic.  HENT:     Head: Normocephalic.     Comments: 1 cm laceration with no active bleeding noted to the superior occiput.  Mild TTP overlying the site.    Right Ear: External ear normal.     Left Ear: External ear  normal.     Nose: Nose normal.     Mouth/Throat:     Mouth: Mucous membranes are moist.     Pharynx: Oropharynx is clear. No oropharyngeal exudate or posterior oropharyngeal erythema.  Eyes:     General: No scleral icterus.       Right eye: No discharge.        Left eye: No discharge.     Extraocular Movements: Extraocular movements intact.  Neck:     Comments: No midline C, T, L-spine tenderness.  Cardiovascular:     Rate and Rhythm: Normal rate and regular rhythm.     Pulses: Normal pulses.     Heart sounds: Normal heart sounds. No murmur heard.  No friction rub. No gallop.   Pulmonary:     Effort: Pulmonary effort is normal. No respiratory distress.     Breath sounds: Normal breath sounds. No stridor. No wheezing, rhonchi or rales.  Abdominal:     General: Abdomen is flat.     Palpations: Abdomen is soft.     Tenderness: There is no abdominal tenderness.     Comments: Abdomen is soft and nontender.  Musculoskeletal:        General: Normal range of motion.     Cervical back: Normal range of motion and neck supple. No tenderness.     Right lower leg: No edema.     Left lower leg: No edema.     Comments: No tenderness noted with manipulation of the pelvic girdle or palpation of the lower extremities.    Skin:    General: Skin is warm and dry.  Neurological:     General: No focal deficit present.     Mental Status: He is alert and oriented to person, place, and time.     Comments: Patient is oriented to person, place, and time. Patient phonates in clear, complete, and coherent sentences. Negative arm drift.  Mild tremor noted  with arms fully extended, which is baseline for patient.  Tardive dyskinesia noted, which is also baseline for patient.  Finger to nose intact bilaterally with notable tremor.  Strength equal with plantar and dorsiflexion of the bilateral feet.  Mildly diminished grip strength on the left compared to the right.  Distal sensation intact in all four extremities.   Psychiatric:        Mood and Affect: Mood normal.        Behavior: Behavior normal.    ED Results / Procedures / Treatments   Labs (all labs ordered are listed, but only abnormal results are displayed) Labs Reviewed - No data to display  EKG None  Radiology CT Head Wo Contrast  Result Date: 05/07/2020 CLINICAL DATA:  Facial trauma. Additional history provided: Witnessed mechanical fall, falling backwards, no loss of consciousness, 1 inch laceration to back of head. EXAM: CT HEAD WITHOUT CONTRAST TECHNIQUE: Contiguous axial images were obtained from the base of the skull through the vertex without intravenous contrast. COMPARISON:  Head CT 05/07/2019 FINDINGS: Brain: Redemonstrated moderate generalized parenchymal atrophy. There is no acute intracranial hemorrhage. No demarcated cortical infarct. No extra-axial fluid collection. No evidence of intracranial mass. No midline shift. Vascular: No hyperdense vessel.  Atherosclerotic calcifications. Skull: Normal. Negative for fracture or focal lesion. Sinuses/Orbits: Visualized orbits show no acute finding. Redemonstrated calcification along the anterior aspect of the right globe. Before, there is a thinned appearance of the bony orbital floor bilaterally. Prominent partial fluid opacification of the left maxillary sinus. Mild right maxillary sinus mucosal thickening. No significant mastoid effusion. IMPRESSION: No evidence of acute intracranial abnormality. Moderate generalized parenchymal atrophy of the brain. Near complete fluid opacification of the left maxillary sinus. Mild right maxillary sinus mucosal thickening. Electronically Signed   By: Jackey Loge DO   On: 05/07/2020 16:09   Procedures .Marland KitchenLaceration Repair  Date/Time: 05/07/2020 7:13 PM Performed by: Placido Sou, PA-C Authorized by: Placido Sou, PA-C   Consent:    Consent obtained:  Verbal   Consent given by:  Patient   Risks discussed:  Pain Anesthesia (see MAR for exact  dosages):    Anesthesia method:  Topical application   Topical anesthetic:  LET Laceration details:    Location:  Scalp   Scalp location:  Occipital   Length (cm):  1 Repair type:    Repair type:  Simple Pre-procedure details:    Preparation:  Patient was prepped and draped in usual sterile fashion Exploration:    Hemostasis achieved with:  Direct pressure   Wound exploration: wound explored through full range of motion     Wound extent: no foreign bodies/material noted     Contaminated: no   Treatment:    Area cleansed with:  Saline   Amount of cleaning:  Standard   Visualized foreign bodies/material removed: no   Skin repair:    Repair method:  Staples   Number of staples:  1 Approximation:    Approximation:  Close Post-procedure details:    Dressing:  Open (no dressing)   Patient tolerance of procedure:  Tolerated well, no immediate complications    Medications Ordered in ED Medications  lidocaine-EPINEPHrine-tetracaine (LET) topical gel (3 mLs Topical Given 05/07/20 1747)  Tdap (BOOSTRIX) injection 0.5 mL (0.5 mLs Intramuscular Given 05/07/20 1747)  cloNIDine (CATAPRES) tablet 0.1 mg (0.1 mg Oral Given 05/07/20 1822)   ED Course  I have reviewed the triage vital signs and the nursing notes.  Pertinent labs & imaging results that were available  during my care of the patient were reviewed by me and considered in my medical decision making (see chart for details).  Clinical Course as of May 07 1921  Thu May 07, 2020  1737 No evidence of acute intracranial abnormality. Moderate generalized parenchymal atrophy of the brain. Near complete fluid opacification of the left maxillary sinus. Mild right maxillary sinus mucosal thickening.   CT Head Wo Contrast [LJ]  1803 Discussed BP with attending physician. Will give a dose of clonidine and continue to monitor.    [LJ]    Clinical Course User Index [LJ] Placido SouJoldersma, Estaban [place], PA-C   MDM Rules/Calculators/A&P                           Pt is a 69 y.o. male that presents with a history, physical exam, and ED Clinical Course as noted above.   Patient presents today due to a mechanical fall that occurred prior to arrival.  He struck his posterior scalp resulting in a small laceration of the scalp with controlled bleeding.  Wound was cleaned by nursing staff, tetanus was updated, wound was closed with a single staple.  Patient tolerated the procedure quite well.  CT scan was obtained of the head given the nature of his fall.  This was negative for acute intracranial abnormalities.  Patient was noted to have high blood pressure with a systolic ranging from 1 88-204.  He was given 0.1 mg of clonidine and monitored in the emergency department.  Systolic is now below 200 with his most recent reading being 195/92.  Patient endorses no headache, lightheadedness, dizziness, chest pain, shortness of breath.  We discussed the risks of hypertension in length and recommended that he follow-up with his primary care provider regarding his blood pressure.  He states he will do so.  He knows he needs to have the staples on his scalp removed in 7 days.  His questions were answered and he was amicable at the time of discharge.  His vital signs are stable.  Patient has a new legal guardian who I reached out to but was unable to connect with.   An After Visit Summary was printed and given to the patient.  Condition at discharge: Stable  Note: Portions of this report may have been transcribed using voice recognition software. Every effort was made to ensure accuracy; however, inadvertent computerized transcription errors may be present.    Final Clinical Impression(s) / ED Diagnoses Final diagnoses:  Fall, initial encounter  Injury of head, initial encounter  Hypertension, unspecified type   Rx / DC Orders ED Discharge Orders    None       Placido SouJoldersma, Rosenda Geffrard, PA-C 05/07/20 1923    Rolan BuccoBelfi, Melanie, MD 05/07/20 [phone]

## 2020-05-07 NOTE — ED Triage Notes (Signed)
Per EMS-had a witnessed mechanical fall reaching for his walker-fell backwards-no LOC-1 inch laceration to back of head-bleeding controlled-placed in C-collar-no h/a, dizziness, no blood thinners

## 2020-05-07 NOTE — ED Notes (Signed)
Unable to reach Nursing station at [facility]

## 2020-05-07 NOTE — Discharge Instructions (Addendum)
Please return to the ER with any new or worsening symptoms.  Please have the staples in your head removed in 7 days.  It was a pleasure to meet you.

## 2020-05-21 ENCOUNTER — Encounter: Payer: Self-pay | Admitting: Neurology

## 2020-05-21 ENCOUNTER — Encounter: Payer: Medicare Other | Admitting: Neurology

## 2020-05-21 ENCOUNTER — Telehealth: Payer: Self-pay | Admitting: Neurology

## 2020-05-21 NOTE — Telephone Encounter (Signed)
Patient no-showed today for 2 appointments emg and ncs. Office called him to try and confirm. The EMG team also tried to call him yesterday as well to see how he was doing clinically. He saw Dr. Frances Furbish about a month ago. He woke up with radial nerve palsy which often improves clinically relatively quickly with PT and conservative measures. I would not reschedule him for emg/ncs until he sees Amy Lomax who can see how he is doing clinically or if he calls for reschedule please inform emg physician who can discuss with them over the phone and see if he needs the emg/ncs in order to minimize any further no shows for these limited appointments.  he has an upcoming appointment with Amy Lomax. Amy can also see how he is doing clinically and let us know. thanks

## 2020-05-21 NOTE — Telephone Encounter (Signed)
I agree with the approach of reassessing clinically.

## 2020-05-25 ENCOUNTER — Encounter: Payer: Self-pay | Admitting: Family Medicine

## 2020-05-25 NOTE — Telephone Encounter (Signed)
Please see phone note from 05/21/20 regarding no show.

## 2020-06-18 ENCOUNTER — Telehealth: Payer: Self-pay | Admitting: Neurology

## 2020-06-18 ENCOUNTER — Encounter: Payer: Medicare Other | Admitting: Neurology

## 2020-06-18 ENCOUNTER — Encounter: Payer: Self-pay | Admitting: Neurology

## 2020-06-18 NOTE — Telephone Encounter (Signed)
Angie: Please follow dismissal protocol as per our No Show Policy, patient missed 2 procedure appts.

## 2020-06-18 NOTE — Telephone Encounter (Signed)
Patient no-showed emg/ncs again today. This is the second time. I will defer to Dr. Frances Furbish whether to dismiss patient but do not place him on my emg/ncs again please.   Note: Please do NOT schedule patient again for EMG/NCS until express permission obtained from emg/ncs physician. Thank you

## 2020-07-14 ENCOUNTER — Ambulatory Visit: Payer: Medicare Other | Admitting: Family Medicine

## 2020-07-14 ENCOUNTER — Ambulatory Visit: Payer: Medicare Other | Admitting: Neurology

## 2020-07-15 ENCOUNTER — Encounter: Payer: Self-pay | Admitting: Neurology

## 2020-10-04 ENCOUNTER — Emergency Department (HOSPITAL_COMMUNITY): Payer: 59

## 2020-10-04 ENCOUNTER — Emergency Department (HOSPITAL_COMMUNITY)
Admission: EM | Admit: 2020-10-04 | Discharge: 2020-10-06 | Disposition: A | Discharge status: Home / Self Care | Payer: 59 | Attending: Emergency Medicine | Admitting: Emergency Medicine

## 2020-10-04 ENCOUNTER — Other Ambulatory Visit: Payer: Self-pay

## 2020-10-04 DIAGNOSIS — I10 Essential (primary) hypertension: Secondary | ICD-10-CM | POA: Insufficient documentation

## 2020-10-04 DIAGNOSIS — Z794 Long term (current) use of insulin: Secondary | ICD-10-CM | POA: Diagnosis not present

## 2020-10-04 DIAGNOSIS — W19XXXA Unspecified fall, initial encounter: Secondary | ICD-10-CM

## 2020-10-04 DIAGNOSIS — Z7984 Long term (current) use of oral hypoglycemic drugs: Secondary | ICD-10-CM | POA: Insufficient documentation

## 2020-10-04 DIAGNOSIS — S0990XA Unspecified injury of head, initial encounter: Secondary | ICD-10-CM | POA: Diagnosis present

## 2020-10-04 DIAGNOSIS — E039 Hypothyroidism, unspecified: Secondary | ICD-10-CM | POA: Diagnosis not present

## 2020-10-04 DIAGNOSIS — Z87891 Personal history of nicotine dependence: Secondary | ICD-10-CM | POA: Insufficient documentation

## 2020-10-04 DIAGNOSIS — Z79899 Other long term (current) drug therapy: Secondary | ICD-10-CM | POA: Insufficient documentation

## 2020-10-04 DIAGNOSIS — E119 Type 2 diabetes mellitus without complications: Secondary | ICD-10-CM | POA: Insufficient documentation

## 2020-10-04 DIAGNOSIS — S0003XA Contusion of scalp, initial encounter: Secondary | ICD-10-CM | POA: Insufficient documentation

## 2020-10-04 DIAGNOSIS — W1839XA Other fall on same level, initial encounter: Secondary | ICD-10-CM | POA: Insufficient documentation

## 2020-10-04 NOTE — ED Triage Notes (Signed)
Pt presents to ED BIB [facility] from [facility]. Pt c/o head pain. Pt had mechanical fall. Pt uses walker at baseline and did not have walker. Pt has hematoma to L head. No LOC, no blood thinners. AAO x4 EMS VS 200/100 HR - 100 RR - 16

## 2020-10-05 ENCOUNTER — Telehealth: Payer: Self-pay

## 2020-10-05 MED ORDER — ACETAMINOPHEN 500 MG PO TABS
1000.0000 mg | ORAL_TABLET | Freq: Once | ORAL | Status: AC
  Administered 2020-10-06: 1000 mg via ORAL
  Filled 2020-10-05: qty 2

## 2020-10-05 NOTE — ED Notes (Signed)
Pt is resting in chair, has ambulated with steady gait to restroom, awaiting ride by [employer]

## 2020-10-05 NOTE — Telephone Encounter (Signed)
CSW attempted to call Pt's guardian in an effort to assist with Pt d/c.  Unable to reach guardian. Left HIPAA compliant message.  ToC team called [employer] for  Transportation. Pt is # 14 on [employer] list.  Updated Pt.

## 2020-10-05 NOTE — ED Notes (Signed)
Called report to [facility], waiting on ptar transportation.

## 2020-10-05 NOTE — ED Notes (Addendum)
SW has been involved and has called for ptar to transport patient back to [facility]. Sort staff is aware. Pt eating and drinking while waiting.

## 2020-10-05 NOTE — ED Provider Notes (Signed)
[facility] EMERGENCY DEPARTMENT Provider Note   CSN: [id number] Arrival date & time: 10/04/20  2118     History Chief Complaint  Patient presents with  . Fall    Ralph [name] is a 70 y.o. male.  Patient with history of schizophrenia, high blood pressure, depression, diabetes presents from [facility] after witnessed mechanical fall.  I talked to the patient and clarified with the staff patient had witnessed fall normally is recommended to use his walker and he was not using a walker and he tripped hitting his head.  No syncope, no seizures.  Patient denies other pain in extremities or hips.  No recent fevers or infectious symptoms.  Patient at baseline per staff.        Past Medical History:  Diagnosis Date  . Allergic rhinitis   . Anxiety   . Arthritis    left hip per pt.  Marland Kitchen BPH (benign prostatic hyperplasia)   . Cataract    had surgery  . Chronic constipation   . Depression   . Diabetes mellitus without complication (HCC)    type 2  . Heart murmur    slight  . Hyperlipidemia   . Hypertension   . Hypothyroidism   . Neuropathy   . Osteoarthritis   . Pancreatic insufficiency   . Peripheral arterial disease (HCC)   . Schizophrenia (HCC)    paranoid   . Thyroid disease    hypothyroid  . Urinary incontinence     Patient Active Problem List   Diagnosis Date Noted  . Hyperplasia of prostate with urinary obstruction 12/12/2019  . Malnutrition of moderate degree 11/26/2016  . Septic arthritis of foot (HCC) 11/26/2016  . Open wound of left foot 11/25/2016  . Thyroid disease   . Hypertension   . Diabetes mellitus without complication (HCC)   . Depression   . BPH (benign prostatic hyperplasia)   . Anxiety     Past Surgical History:  Procedure Laterality Date  . AMPUTATION Left 11/26/2016   Procedure: RAY AMPUTATION LEFT FIFTH TOE;  Surgeon: Beverely Low, MD;  Location: [facility] OR;  Service: Orthopedics;  Laterality: Left;  . CATARACT  EXTRACTION     Bil with implants  . COLONOSCOPY     per pt/ had 2 colon but not sure when.  . COLONOSCOPY    . EYE SURGERY     Bil radial orbital decompression surg / per pt eye socket surgery./ has had 10 surgeries  . Left [facility] PTS/Stent 2018- Dr Edilia Bo     . LOWER EXTREMITY ANGIOGRAPHY N/A 11/28/2016   Procedure: Lower Extremity Angiography;  Surgeon: Chuck Hint, MD;  Location: [facility] INVASIVE CV LAB;  Service: Cardiovascular;  Laterality: N/A;  . PERIPHERAL VASCULAR INTERVENTION Left 11/28/2016   Procedure: Peripheral Vascular Intervention;  Surgeon: Chuck Hint, MD;  Location: [facility] INVASIVE CV LAB;  Service: Cardiovascular;  Laterality: Left;  common iliac  . thyroid ablation    . TONSILLECTOMY     as a child  . TRANSURETHRAL RESECTION OF PROSTATE N/A 12/12/2019   Procedure: TRANSURETHRAL RESECTION OF THE PROSTATE (TURP);  Surgeon: Marcine Matar, MD;  Location: [facility] ORS;  Service: Urology;  Laterality: N/A;  50 MINS       Family History  Problem Relation Age of Onset  . Diabetes Mellitus II Mother   . Heart disease Father   . Lung cancer Father   . Diabetes Mellitus II Paternal Grandfather   . Diabetes Paternal Grandfather   .  Diabetes Brother     Social History   Tobacco Use  . Smoking status: Former Games developer  . Smokeless tobacco: Never Used  Substance Use Topics  . Alcohol use: No  . Drug use: No    Home Medications Prior to Admission medications   Medication Sig Start Date End Date Taking? Authorizing Provider  benztropine (COGENTIN) 0.5 MG tablet Take 0.5 mg by mouth 2 (two) times daily.    [provider]  buPROPion (WELLBUTRIN XL) 300 MG 24 hr tablet Take 300 mg by mouth daily.    [provider]  clonazePAM (KLONOPIN) 0.25 MG disintegrating tablet Take 0.25 mg by mouth daily.    [provider]  clonazePAM (KLONOPIN) 0.5 MG tablet Take 0.5 mg by mouth at bedtime.     [provider]  divalproex (DEPAKOTE ER) 250  MG 24 hr tablet Take 250 mg by mouth every evening.     [provider]  divalproex (DEPAKOTE ER) 500 MG 24 hr tablet Take 1,000 mg by mouth every evening.     [provider]  Eyelid Cleansers (OCUSOFT LID SCRUB PLUS) PADS Place 1 each into both eyes 2 (two) times daily.    [provider]  insulin detemir (LEVEMIR) 100 UNIT/ML injection Inject 15 Units into the skin at bedtime.     [provider]  levocetirizine (XYZAL) 5 MG tablet Take 5 mg by mouth daily.     [provider]  levothyroxine (SYNTHROID) 150 MCG tablet Take 150 mcg by mouth daily before breakfast.    [provider]  metFORMIN (GLUCOPHAGE) 500 MG tablet Take 1,000 mg by mouth in the morning and at bedtime.    [provider]  mineral oil (FLEET OIL) enema Place 1 enema rectally daily as needed for severe constipation.    [provider]  Multiple Vitamins-Minerals (CERTAVITE/ANTIOXIDANTS) TABS Take 1 tablet by mouth daily.    [provider]  Nutritional Supplement LIQD Take 1 Bottle by mouth with breakfast, with lunch, and with evening meal. Mighty Shakes    [provider]  Nutritional Supplements (GLUCOSE MANAGEMENT) TABS Take 1 tablet by mouth as needed (blood sugar below 60).    [provider]  OLANZapine (ZYPREXA) 20 MG tablet Take 20 mg by mouth at bedtime.    [provider]  Pancrelipase, Lip-Prot-Amyl, (CREON) 24000-76000 units CPEP Take 3 capsules by mouth 3 (three) times daily with meals.     [provider]  Polyethyl Glycol-Propyl Glycol (SYSTANE ULTRA) 0.4-0.3 % SOLN Place 1 drop into the left eye See admin instructions. Instill 1 drop into the left eye 4 to 6 times a day    [provider]  polyethylene glycol (MIRALAX / GLYCOLAX) 17 g packet Take 17 g by mouth daily.    [provider]  pregabalin (LYRICA) 75 MG capsule Take 75 mg by mouth 2 (two) times daily.     [provider]  ramipril (ALTACE) 2.5 MG capsule Take 5 mg by mouth daily.    [provider]  simvastatin (ZOCOR) 40 MG tablet Take 40 mg by mouth at bedtime.    [provider]  sulfamethoxazole-trimethoprim (BACTRIM DS) 800-160 MG tablet Take 1 tablet by mouth 2 (two) times daily. 12/13/19   Marcine Matar, MD  Valbenazine Tosylate [facility]) 40 MG CAPS Take 40 mg by mouth at bedtime.    [provider]  White Petrolatum-Mineral Oil (GENTEAL TEARS NIGHT-TIME) OINT Apply 1 application to eye at  bedtime. Pull down lower lid and apply a thin ribbon    [provider]  White Petrolatum-Mineral Oil (REFRESH P.M. OP) Place 1 application into the right eye every 2 (two) hours while awake.    [provider]    Allergies    Antihistamines, chlorpheniramine-type; Antihistamines, diphenhydramine-type; Antihistamines, loratadine-type; and Ethanolamine  Review of Systems   Review of Systems  Constitutional: Negative for chills and fever.  HENT: Negative for congestion.   Eyes: Negative for visual disturbance.  Respiratory: Negative for shortness of breath.   Cardiovascular: Negative for chest pain.  Gastrointestinal: Negative for abdominal pain and vomiting.  Genitourinary: Negative for dysuria and flank pain.  Musculoskeletal: Negative for back pain, neck pain and neck stiffness.  Skin: Negative for rash.  Neurological: Positive for headaches. Negative for light-headedness.    Physical Exam Updated Vital Signs BP (!) 215/87 (BP Location: Right Arm)   Pulse 97   Temp 98.1 F (36.7 C) (Oral)   Resp 16   SpO2 99%   Physical Exam Vitals and nursing note reviewed.  Constitutional:      Appearance: He is well-developed and well-nourished.  HENT:     Head: Normocephalic.     Comments: Patient has 3 cm hematoma left parietal scalp, no step-off.  No significant tenderness.  No midline cervical tenderness full range of motion in neck.  No  remaining spinal tenderness.  Patient can ambulate without pain or weakness bilateral. Eyes:     General:        Right eye: No discharge.        Left eye: No discharge.     Conjunctiva/sclera: Conjunctivae normal.  Neck:     Trachea: No tracheal deviation.  Cardiovascular:     Rate and Rhythm: Normal rate and regular rhythm.  Pulmonary:     Effort: Pulmonary effort is normal.     Breath sounds: Normal breath sounds.  Abdominal:     General: There is no distension.     Palpations: Abdomen is soft.     Tenderness: There is no abdominal tenderness. There is no guarding.  Musculoskeletal:        General: No swelling, tenderness or edema.     Cervical back: Normal range of motion and neck supple.  Skin:    General: Skin is warm.     Capillary Refill: Capillary refill takes less than 2 seconds.     Findings: No rash.  Neurological:     General: No focal deficit present.     Mental Status: He is alert and oriented to person, place, and time.     Cranial Nerves: No cranial nerve deficit.     Motor: No weakness.  Psychiatric:        Mood and Affect: Mood and affect and mood normal.     ED Results / Procedures / Treatments   Labs (all labs ordered are listed, but only abnormal results are displayed) Labs Reviewed - No data to display  EKG None  Radiology CT Head Wo Contrast  Result Date: 10/04/2020 CLINICAL DATA:  Fall, facial trauma EXAM: CT HEAD WITHOUT CONTRAST TECHNIQUE: Contiguous axial images were obtained from the base of the skull through the vertex without intravenous contrast. COMPARISON:  05/07/2020 FINDINGS: Brain: There is atrophy and chronic small vessel disease changes. No acute intracranial abnormality. Specifically, no hemorrhage, hydrocephalus, mass lesion, acute infarction, or significant intracranial injury. Vascular: No hyperdense vessel or unexpected calcification. Skull: No acute calvarial abnormality. Sinuses/Orbits: No acute findings near  complete  opacification of the left maxillary sinus with soft tissue, likely mucous retention cyst or polyp. Other: None IMPRESSION: Atrophy, chronic microvascular disease. No acute intracranial abnormality. Electronically Signed   By: Charlett Nose M.D.   On: 10/04/2020 22:05    Procedures Procedures   Medications Ordered in ED Medications  acetaminophen (TYLENOL) tablet 1,000 mg (has no administration in time range)    ED Course  I have reviewed the triage vital signs and the nursing notes.  Pertinent labs & imaging results that were available during my care of the patient were reviewed by me and considered in my medical decision making (see chart for details).    MDM Rules/Calculators/A&P                          Patient presents after mechanical fall.  Discussed with [facility] no other concerns.  Patient blood pressure high likely combination of pain plus history of high blood pressure.  Discussed follow-up for this outpatient.  CT scan performed no acute fracture or bleeding.  Oral fluids and ride home discussed with [facility]. Tylenol prn for pain   Final Clinical Impression(s) / ED Diagnoses Final diagnoses:  Fall, initial encounter  Contusion of left temporofrontal scalp, initial encounter    Rx / DC Orders ED Discharge Orders    None       Blane Ohara, MD 10/05/20 1743

## 2020-10-05 NOTE — ED Notes (Signed)
Keeps asking to speak with the doctor so he can go back home.

## 2020-10-05 NOTE — Discharge Instructions (Signed)
Have your blood pressure rechecked by primary doctor. Return for new concerns.

## 2020-10-06 NOTE — ED Notes (Signed)
Pt given sandwich and drink.

## 2020-10-06 NOTE — ED Notes (Signed)
[facility]ar arrived for transport

## 2020-10-06 NOTE — ED Notes (Signed)
Ambulated pt to bathroom 

## 2020-10-13 ENCOUNTER — Ambulatory Visit (INDEPENDENT_AMBULATORY_CARE_PROVIDER_SITE_OTHER): Payer: 59 | Admitting: Neurology

## 2020-10-13 ENCOUNTER — Other Ambulatory Visit: Payer: Self-pay

## 2020-10-13 ENCOUNTER — Encounter: Payer: Self-pay | Admitting: Neurology

## 2020-10-13 VITALS — BP 175/78 | HR 102 | Ht 72.0 in | Wt 178.4 lb

## 2020-10-13 DIAGNOSIS — R296 Repeated falls: Secondary | ICD-10-CM | POA: Diagnosis not present

## 2020-10-13 DIAGNOSIS — F0391 Unspecified dementia with behavioral disturbance: Secondary | ICD-10-CM

## 2020-10-13 DIAGNOSIS — R251 Tremor, unspecified: Secondary | ICD-10-CM | POA: Diagnosis not present

## 2020-10-13 DIAGNOSIS — G629 Polyneuropathy, unspecified: Secondary | ICD-10-CM | POA: Diagnosis not present

## 2020-10-13 NOTE — Patient Instructions (Signed)
1. Schedule MRI brain with and without contrast  2. Recommend Physical Therapy, Occupational Therapy, Speech therapy (for cognitive therapy)  3. Recommend increasing level of care to SNF level with memory care  4. Follow-up in 6-7 months, call for any changes

## 2020-10-13 NOTE — Progress Notes (Signed)
NEUROLOGY [name] NOTE  Ralph [name] MRN: [id number] DOB: [birth year]  Referring provider: Dr. Janie [name] Primary care provider: Kathe Becton, FNP  Reason for consult:  Evaluation of dementia, acute cognitive decline  Dear Dr Ralph [name]:  Thank you for your kind referral of Ralph [name] of the above symptoms. Although his history is well known to you, please allow me to reiterate it for the purpose of our medical record. The patient was accompanied to the clinic by his guardian through [facility] who also provides collateral information. His sister Ralph [name] also provided a 1-page account of concerns. Records and images were personally reviewed where available.   HISTORY OF PRESENT ILLNESS: This is a 70 year old right-handed man with a history of hypertension, hyperlipidemia, diabetes, neuropathy, hypothyroidism, Graves disease s/p bilateral orbital surgeries, depression, anxiety, schizophrenia, left radial nerve palsy in 03/2020, presenting for evaluation of cognitive decline. He had been at assisted living in [facility] and on PCP visit in November 2021, his DSS reported need for higher level of skilled care due to recurrent falls due to diabetic neuropathy, decline in memory, inability to dress himself due to left wrist drop. His sister Ralph [name] provided a 1-page report of her observations as well. She has spoken to him over many years at least by phone 1-3 times a week at length. She has observed him when he visits her, most recently in July 2021 and December 2021. In the past year, she has noticed a precipitous change in his short-term memory. Prior to this, he seemed to be able to recall in very close detail long term events (she noted this on his visit with her in December 2019). He has been under DSS guardianship since 2016, living in facilities due to his inability to care for himself due to psychiatric issues. He sees a Social worker once a week and a psychiatrist  once a month. Ralph [name] has been working with him for the past 68 months. He has been at [facility] for several years, however over the past year, family and staff have noticed a drastic change. He was previously independent with dressing and bathing but now needs assistance. Medications have always been administered to him. He says he showers every 2 days, Ralph [name] states he does not bathe regularly, staff tries to make sure he changes his clothes. His sister notes that over the past year, he would not remember prior conversations, repeating the same conversation 4-5 times as if they had never been mentioned before. She states he often conflates events, he had been advised to wait for his first COVID vaccine due to recent COVID infection, then when he was due for his booster, he turned it down saying he was told to wait to get it when this advice was not given for the booster.   He was evaluated by neurologist Dr. Rexene [name] in August 2021 for left hand weakness and swelling, exam showing left wrist drop consistent with radial nerve palsy. He was scheduled for an EMG but did not proceed with testing. They report that there is more mobility on his left hand than before. He has had frequent falls recently, last year he had a bad fall hitting the back of his head. He walks really fast but does not put his foot down. Falls worse in the past 6 months. He was in the hospital last week due to a fall in his room. He uses his walker at all times. He has urinary incontinence however  the adult diapers do not help. He has intermittent numbness in his feet, L>R, Lyrica helps with the pain in his feet. Sleep is good, he is on several medications which help. Mood stabilizer is helping. They deny any hallucinations, reporting he has gotten better about that but mostly confuses or mixes things up. Ralph [name] denies any behavioral issues, sometimes he gets high strung when he wants his needs met but no aggressive.     PAST MEDICAL  HISTORY: Past Medical History:  Diagnosis Date  . Allergic rhinitis   . Anxiety   . Arthritis    left hip per pt.  Marland Kitchen BPH (benign prostatic hyperplasia)   . Cataract    had surgery  . Chronic constipation   . Depression   . Diabetes mellitus without complication ([place])    type 2  . Heart murmur    slight  . Hyperlipidemia   . Hypertension   . Hypothyroidism   . Neuropathy   . Osteoarthritis   . Pancreatic insufficiency   . Peripheral arterial disease ([place])   . Schizophrenia ([place])    paranoid   . Thyroid disease    hypothyroid  . Urinary incontinence     PAST SURGICAL HISTORY: Past Surgical History:  Procedure Laterality Date  . AMPUTATION Left 11/26/2016   Procedure: RAY AMPUTATION LEFT FIFTH TOE;  Surgeon: Ralph Cedars, MD;  Location: [facility];  Service: Orthopedics;  Laterality: Left;  . CATARACT EXTRACTION     Bil with implants  . COLONOSCOPY     per pt/ had 2 colon but not sure when.  . COLONOSCOPY    . EYE SURGERY     Bil radial orbital decompression surg / per pt eye socket surgery./ has had 10 surgeries  . Left [facility] PTS/Stent 2018- Dr Ralph [name]     . LOWER EXTREMITY ANGIOGRAPHY N/A 11/28/2016   Procedure: Lower Extremity Angiography;  Surgeon: Angelia Mould, MD;  Location: [place] [facility];  Service: Cardiovascular;  Laterality: N/A;  . PERIPHERAL VASCULAR INTERVENTION Left 11/28/2016   Procedure: Peripheral Vascular Intervention;  Surgeon: Angelia Mould, MD;  Location: [place] [facility];  Service: Cardiovascular;  Laterality: Left;  common iliac  . thyroid ablation    . TONSILLECTOMY     as a child  . TRANSURETHRAL RESECTION OF PROSTATE N/A 12/12/2019   Procedure: TRANSURETHRAL RESECTION OF THE PROSTATE (TURP);  Surgeon: Ralph Gallo, MD;  Location: [facility];  Service: Urology;  Laterality: N/A;  75 MINS    MEDICATIONS: Current Outpatient Medications on File Prior to Visit  Medication Sig Dispense Refill  . benztropine (COGENTIN) 0.5 MG tablet  Take 0.5 mg by mouth 2 (two) times daily.    Marland Kitchen buPROPion (WELLBUTRIN XL) 300 MG 24 hr tablet Take 300 mg by mouth daily.    . clonazePAM (KLONOPIN) 0.25 MG disintegrating tablet Take 0.25 mg by mouth daily.    . clonazePAM (KLONOPIN) 0.5 MG tablet Take 0.5 mg by mouth at bedtime.     . divalproex (DEPAKOTE ER) 500 MG 24 hr tablet Take 1,000 mg by mouth every evening.     . divalproex (DEPAKOTE) 500 MG DR tablet Take 500 mg by mouth every [name].    . Eyelid Cleansers (OCUSOFT LID SCRUB PLUS) PADS Place 1 each into both eyes 2 (two) times daily.    . insulin detemir (LEVEMIR) 100 UNIT/ML injection Inject 15 Units into the skin at bedtime.     Marland Kitchen levocetirizine (XYZAL) 5 MG tablet Take 5  mg by mouth daily.     Marland Kitchen levothyroxine (SYNTHROID) 150 MCG tablet Take 150 mcg by mouth daily before breakfast.    . metFORMIN (GLUCOPHAGE) 500 MG tablet Take 1,000 mg by mouth in the [name] and at bedtime.    . mineral oil (FLEET OIL) enema Place 1 enema rectally daily as needed for severe constipation.    . Multiple Vitamins-Minerals (CERTAVITE/ANTIOXIDANTS) TABS Take 1 tablet by mouth daily.    . Nutritional Supplement LIQD Take 1 Bottle by mouth with breakfast, with lunch, and with evening meal. Mighty Shakes    . Nutritional Supplements (GLUCOSE MANAGEMENT) TABS Take 1 tablet by mouth as needed (blood sugar below 60).    . OLANZapine (ZYPREXA) 20 MG tablet Take 20 mg by mouth at bedtime.    . Pancrelipase, Lip-Prot-Amyl, 24000-76000 units CPEP Take 3 capsules by mouth 3 (three) times daily with meals.     Vladimir Faster Glycol-Propyl Glycol (SYSTANE ULTRA) 0.4-0.3 % SOLN Place 1 drop into the left eye See admin instructions. Instill 1 drop into the left eye 4 to 6 times a day    . polyethylene glycol (MIRALAX / GLYCOLAX) 17 g packet Take 17 g by mouth daily.    . pregabalin (LYRICA) 75 MG capsule Take 75 mg by mouth 2 (two) times daily.     . ramipril (ALTACE) 2.5 MG capsule Take 5 mg by mouth daily.    .  simvastatin (ZOCOR) 40 MG tablet Take 40 mg by mouth at bedtime.    . tamsulosin (FLOMAX) 0.4 MG CAPS capsule 1 capsule 30 minutes after breakfast each day    . Valbenazine Tosylate (INGREZZA) 40 MG CAPS Take 40 mg by mouth at bedtime.    . White Petrolatum-Mineral Oil (GENTEAL TEARS NIGHT-TIME) OINT Apply 1 application to eye at bedtime. Pull down lower lid and apply a thin ribbon    . White Petrolatum-Mineral Oil (REFRESH P.M. OP) Place 1 application into the right eye every 2 (two) hours while awake.     No current facility-administered medications on file prior to visit.    ALLERGIES: Allergies  Allergen Reactions  . Antihistamines, Chlorpheniramine-Type Other (See Comments)    Per MAR/per pt, family members did not want him to take these meds/ no allergies  . Antihistamines, Diphenhydramine-Type Other (See Comments)    Per MAR  . Antihistamines, Loratadine-Type Other (See Comments)    Per MAR  . Ethanolamine Other (See Comments)    Per MAR    FAMILY HISTORY: Family History  Problem Relation Age of Onset  . Diabetes Mellitus II Mother   . Heart disease Father   . Lung cancer Father   . Diabetes Mellitus II Paternal Grandfather   . Diabetes Paternal Grandfather   . Diabetes Brother     SOCIAL HISTORY: Social History   Socioeconomic History  . Marital status: Married    Spouse name: Not on file  . Number of children: Not on file  . Years of education: Not on file  . Highest education level: Not on file  Occupational History  . Not on file  Tobacco Use  . Smoking status: Former Research scientist (life sciences)  . Smokeless tobacco: Never Used  Vaping Use  . Vaping Use: Never used  Substance and Sexual Activity  . Alcohol use: No  . Drug use: No  . Sexual activity: Not on file  Other Topics Concern  . 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 [employer]    Social Determinants of Health   Financial Resource  Strain: Not on file  Food Insecurity: Not on file  Transportation Needs: Not on file  Physical Activity: Not on file  Stress: Not on file  Social Connections: Not on file  Intimate Partner Violence: Not on file     PHYSICAL EXAM: Vitals:   10/13/20 0856  BP: (!) 175/78  Pulse: (!) 102  SpO2: 98%   General: No acute distress Head:  Normocephalic/atraumatic, s/p eye surgeries with asymmetric palpebral fissure (right eye smaller) Skin/Extremities: No rash, no edema Neurological Exam: Mental status: alert and awake, no dysarthria or aphasia, Fund of knowledge is reduced.  Recent and remote memory are impaired.  Attention and concentration are reduced. Able to name objects and repeat phrases. [facility] 17/30 Montreal Cognitive Assessment  10/13/2020  Visuospatial/ Executive (0/5) 4  Naming (0/3) 3  Attention: Read list of digits (0/2) 2  Attention: Read list of letters (0/1) 0  Attention: Serial 7 subtraction starting at 100 (0/3) 0  Language: Repeat phrase (0/2) 2  Language : Fluency (0/1) 0  Abstraction (0/2) 1  Delayed Recall (0/5) 2  Orientation (0/6) 2  Total 16  Adjusted Score (based on education) 17    Cranial nerves: CN I: not tested CN II: pupils equal, round and reactive to light, visual fields intact CN III, IV, VI:  full range of motion, no nystagmus, no ptosis CN V: facial sensation intact CN VII: upper and lower face symmetric CN VIII: hearing intact to conversation CN XI: sternocleidomastoid and trapezius muscles intact CN XII: tongue midline Bulk & Tone: normal, no fasciculations, no cogwheeling Motor: 5/5 throughout with no pronator drift. Sensation: intact to light touch, cold, pin, vibration sense.  No extinction to double simultaneous stimulation.  Romberg test negative Deep Tendon Reflexes: +1 throughout, no ankle clonus Plantar responses: downgoing bilaterally Cerebellar: no incoordination on finger to nose testing Gait: slow and cautious, no  ataxia Tremor: no resting tremor, coarse bilateral postural and endpoint tremors, L>R.    IMPRESSION: This is a 70 year old right-handed man with a history of hypertension, hyperlipidemia, diabetes, neuropathy, hypothyroidism, Graves disease s/p bilateral orbital surgeries, depression, anxiety, schizophrenia, left radial nerve palsy in 03/2020, presenting for evaluation of cognitive decline and increase in falls, now requiring increased supervision/higher level of care. His neurological exam is non-focal, he has bilateral hand tremors, MOCA score 17/30. He is on multiple psychoactive medications however there has been no change in medication regimen since cognitive changes started. Etiology of symptoms unclear, MRI brain with and without contrast will be ordered to assess for underlying structural abnormality. Individuals with schizophrenia have an increased risk for developing dementia as they age, and this typically involves frontal-subcortical pattern of deficits. He will benefit from physical therapy, occupational therapy, and speech/cognitive therapy. Continue close supervision. Follow-up in 6-7 months, they know to call for any changes.   Thank you for allowing me to participate in the care of this patient. Please do not hesitate to call for any questions or concerns.   Ellouise Newer, M.D.  CC: Dr. Theda [name], Ralph Becton, FNP

## 2020-10-16 ENCOUNTER — Telehealth: Payer: Self-pay | Admitting: Neurology

## 2020-10-21 ENCOUNTER — Other Ambulatory Visit (INDEPENDENT_AMBULATORY_CARE_PROVIDER_SITE_OTHER): Payer: 59

## 2020-10-21 ENCOUNTER — Telehealth: Payer: Self-pay

## 2020-10-21 DIAGNOSIS — R296 Repeated falls: Secondary | ICD-10-CM

## 2020-10-21 DIAGNOSIS — R251 Tremor, unspecified: Secondary | ICD-10-CM

## 2020-10-21 NOTE — Telephone Encounter (Signed)
Order faxed for walker. 

## 2020-10-21 NOTE — Telephone Encounter (Signed)
Therapist from [facility] called asking for verbal orders 2 times a week for 2 weeks and 1 time a week for 2 weeks.( verbal orders given)  Also asking for an order for front wheel walker to be faxed to [phone]. Are you ok with me sending order for the walker?

## 2020-10-21 NOTE — Telephone Encounter (Signed)
Okay, thanks

## 2020-10-23 ENCOUNTER — Telehealth: Payer: Self-pay

## 2020-10-23 NOTE — Telephone Encounter (Signed)
[facility] is asking about a signed note from pt visit on 10/13/20.

## 2020-10-23 NOTE — Telephone Encounter (Signed)
Toniann Fail with [facility] called asking for verbal orders for pt to be seen once a week for 8 weeks for speech, verbal orders given

## 2020-10-25 ENCOUNTER — Encounter: Payer: Self-pay | Admitting: Neurology

## 2020-10-25 NOTE — Telephone Encounter (Signed)
Done, thanks

## 2020-10-26 NOTE — Telephone Encounter (Signed)
[facility] called informed that pt office note is signed

## 2020-10-27 ENCOUNTER — Telehealth: Payer: Self-pay

## 2020-10-27 NOTE — Telephone Encounter (Signed)
Ralph [name] OT called voice mail left with verbal orders for OT 1 x 1 week, 1 x 2 weeks, 1 x 4 weeks, for self care, strength, and safety

## 2020-10-28 ENCOUNTER — Other Ambulatory Visit: Payer: 59

## 2020-11-02 ENCOUNTER — Ambulatory Visit
Admission: RE | Admit: 2020-11-02 | Discharge: 2020-11-02 | Disposition: A | Discharge status: Home / Self Care | Payer: 59 | Source: Ambulatory Visit | Attending: Neurology | Admitting: Neurology

## 2020-11-02 DIAGNOSIS — R296 Repeated falls: Secondary | ICD-10-CM

## 2020-11-02 DIAGNOSIS — F0391 Unspecified dementia with behavioral disturbance: Secondary | ICD-10-CM

## 2020-11-02 DIAGNOSIS — G629 Polyneuropathy, unspecified: Secondary | ICD-10-CM

## 2020-11-02 DIAGNOSIS — R251 Tremor, unspecified: Secondary | ICD-10-CM

## 2020-11-02 MED ORDER — GADOBENATE DIMEGLUMINE 529 MG/ML IV SOLN
15.0000 mL | Freq: Once | INTRAVENOUS | Status: AC | PRN
  Administered 2020-11-02: 15 mL via INTRAVENOUS

## 2020-11-10 ENCOUNTER — Telehealth: Payer: Self-pay

## 2020-11-10 NOTE — Telephone Encounter (Signed)
-----   Message from Van Clines, MD sent at 11/10/2020 12:48 PM EDT ----- Pls let Ralph [name] his legal guardian know that the brain MRI did not show any evidence of tumor, stroke, or bleed. It showed age-related changes, thanks

## 2020-11-10 NOTE — Telephone Encounter (Signed)
Pt guardian called no answer left a voice mail to call the office back

## 2020-11-12 ENCOUNTER — Telehealth: Payer: Self-pay

## 2020-11-12 NOTE — Telephone Encounter (Signed)
Spoke with Ralph [name] his legal guardian informed her that the brain MRI did not show any evidence of tumor, stroke, or bleed. It showed age-related changes

## 2020-11-12 NOTE — Telephone Encounter (Signed)
-----   Message from Karen M Aquino, MD sent at 11/10/2020 12:48 PM EDT ----- Pls let Cheri his legal guardian know that the brain MRI did not show any evidence of tumor, stroke, or bleed. It showed age-related changes, thanks 

## 2020-11-20 ENCOUNTER — Emergency Department (HOSPITAL_COMMUNITY): Payer: 59

## 2020-11-20 ENCOUNTER — Other Ambulatory Visit: Payer: Self-pay

## 2020-11-20 ENCOUNTER — Emergency Department (HOSPITAL_COMMUNITY)
Admission: EM | Admit: 2020-11-20 | Discharge: 2020-11-21 | Disposition: A | Discharge status: Home / Self Care | Payer: 59 | Attending: Emergency Medicine | Admitting: Emergency Medicine

## 2020-11-20 ENCOUNTER — Encounter (HOSPITAL_COMMUNITY): Payer: Self-pay

## 2020-11-20 DIAGNOSIS — Z79899 Other long term (current) drug therapy: Secondary | ICD-10-CM | POA: Diagnosis not present

## 2020-11-20 DIAGNOSIS — R2990 Unspecified symptoms and signs involving the nervous system: Secondary | ICD-10-CM | POA: Diagnosis not present

## 2020-11-20 DIAGNOSIS — E039 Hypothyroidism, unspecified: Secondary | ICD-10-CM | POA: Diagnosis not present

## 2020-11-20 DIAGNOSIS — Z794 Long term (current) use of insulin: Secondary | ICD-10-CM | POA: Diagnosis not present

## 2020-11-20 DIAGNOSIS — I639 Cerebral infarction, unspecified: Secondary | ICD-10-CM | POA: Insufficient documentation

## 2020-11-20 DIAGNOSIS — E119 Type 2 diabetes mellitus without complications: Secondary | ICD-10-CM | POA: Diagnosis not present

## 2020-11-20 DIAGNOSIS — Z87891 Personal history of nicotine dependence: Secondary | ICD-10-CM | POA: Diagnosis not present

## 2020-11-20 DIAGNOSIS — I10 Essential (primary) hypertension: Secondary | ICD-10-CM | POA: Insufficient documentation

## 2020-11-20 DIAGNOSIS — Z7984 Long term (current) use of oral hypoglycemic drugs: Secondary | ICD-10-CM | POA: Insufficient documentation

## 2020-11-20 DIAGNOSIS — W19XXXA Unspecified fall, initial encounter: Secondary | ICD-10-CM

## 2020-11-20 HISTORY — DX: Drug induced subacute dyskinesia: G24.01

## 2020-11-20 LAB — CBG MONITORING, ED: Glucose-Capillary: 95 mg/dL (ref 70–99)

## 2020-11-20 LAB — DIFFERENTIAL
Abs Immature Granulocytes: 0.04 10*3/uL (ref 0.00–0.07)
Basophils Absolute: 0.1 10*3/uL (ref 0.0–0.1)
Basophils Relative: 1 %
Eosinophils Absolute: 0.1 10*3/uL (ref 0.0–0.5)
Eosinophils Relative: 2 %
Immature Granulocytes: 1 %
Lymphocytes Relative: 22 %
Lymphs Abs: 1.7 10*3/uL (ref 0.7–4.0)
Monocytes Absolute: 0.6 10*3/uL (ref 0.1–1.0)
Monocytes Relative: 7 %
Neutro Abs: 5.3 10*3/uL (ref 1.7–7.7)
Neutrophils Relative %: 67 %

## 2020-11-20 LAB — CBC
HCT: 30 % — ABNORMAL LOW (ref 39.0–52.0)
Hemoglobin: 9.8 g/dL — ABNORMAL LOW (ref 13.0–17.0)
MCH: 31.4 pg (ref 26.0–34.0)
MCHC: 32.7 g/dL (ref 30.0–36.0)
MCV: 96.2 fL (ref 80.0–100.0)
Platelets: 212 10*3/uL (ref 150–400)
RBC: 3.12 MIL/uL — ABNORMAL LOW (ref 4.22–5.81)
RDW: 13.1 % (ref 11.5–15.5)
WBC: 7.9 10*3/uL (ref 4.0–10.5)
nRBC: 0 % (ref 0.0–0.2)

## 2020-11-20 LAB — I-STAT CHEM 8, ED
BUN: 26 mg/dL — ABNORMAL HIGH (ref 8–23)
Calcium, Ion: 1.14 mmol/L — ABNORMAL LOW (ref 1.15–1.40)
Chloride: 106 mmol/L (ref 98–111)
Creatinine, Ser: 1.5 mg/dL — ABNORMAL HIGH (ref 0.61–1.24)
Glucose, Bld: 102 mg/dL — ABNORMAL HIGH (ref 70–99)
HCT: 28 % — ABNORMAL LOW (ref 39.0–52.0)
Hemoglobin: 9.5 g/dL — ABNORMAL LOW (ref 13.0–17.0)
Potassium: 4.5 mmol/L (ref 3.5–5.1)
Sodium: 141 mmol/L (ref 135–145)
TCO2: 23 mmol/L (ref 22–32)

## 2020-11-20 LAB — COMPREHENSIVE METABOLIC PANEL
ALT: 11 U/L (ref 0–44)
AST: 18 U/L (ref 15–41)
Albumin: 2.6 g/dL — ABNORMAL LOW (ref 3.5–5.0)
Alkaline Phosphatase: 56 U/L (ref 38–126)
Anion gap: 4 — ABNORMAL LOW (ref 5–15)
BUN: 24 mg/dL — ABNORMAL HIGH (ref 8–23)
CO2: 26 mmol/L (ref 22–32)
Calcium: 8.3 mg/dL — ABNORMAL LOW (ref 8.9–10.3)
Chloride: 109 mmol/L (ref 98–111)
Creatinine, Ser: 1.53 mg/dL — ABNORMAL HIGH (ref 0.61–1.24)
GFR, Estimated: 49 mL/min — ABNORMAL LOW (ref >60)
Glucose, Bld: 107 mg/dL — ABNORMAL HIGH (ref 70–99)
Potassium: 4.6 mmol/L (ref 3.5–5.1)
Sodium: 139 mmol/L (ref 135–145)
Total Bilirubin: 0.5 mg/dL (ref 0.3–1.2)
Total Protein: 5.3 g/dL — ABNORMAL LOW (ref 6.5–8.1)

## 2020-11-20 LAB — APTT: aPTT: 33 seconds (ref 24–36)

## 2020-11-20 LAB — PROTIME-INR
INR: 1.1 (ref 0.8–1.2)
Prothrombin Time: 13.5 seconds (ref 11.4–15.2)

## 2020-11-20 MED ORDER — SODIUM CHLORIDE 0.9% FLUSH: 3.0000 mL | Freq: Once | INTRAVENOUS | Status: DC

## 2020-11-20 MED ORDER — SODIUM CHLORIDE 0.9 % IV BOLUS
500.0000 mL | Freq: Once | INTRAVENOUS | Status: AC
  Administered 2020-11-21: 500 mL via INTRAVENOUS

## 2020-11-20 NOTE — ED Notes (Signed)
bgl 95mg /dl Dr aware.

## 2020-11-20 NOTE — ED Provider Notes (Signed)
Fall, found face down at nurse facility, unwitnessed fall.  Found unresponsive, with left facial droop LSN 7:15 pm Here he is back to baseline He reports he has residual left sided weakness from previous stroke, but not facial droop CT neg Per neuro - MRI, which is pending  Repeat neuro exam then re-Consult neuro after MRI  2:00 - Patient is sleeping. Found hypertensive, history of same. No chest pain. Vitals stable otherwise.   MRI negative. Per neurology, no other stroke work up needed. UA pending - needs collection. This was addressed with the nurse.   4:00 - UA negative for infection. The patient was discussed with neurology who has cleared the patient from their standpoint. He can be discharged back to the facility to continue routine care.    Elpidio Anis, PA-C 11/21/20 0431    Linwood Dibbles, MD 11/24/20 [phone]

## 2020-11-20 NOTE — ED Triage Notes (Signed)
Brought in as Code stroke from SNF - pt was unwitnessed fall - staff reports left sided weakness with facial droop and slurring of speech. Last known well 1915.

## 2020-11-20 NOTE — Consult Note (Signed)
Neurology Consultation  Reason for Consult: Code stroke Referring Physician: Dr. Linwood Dibbles  CC: Left-sided weakness, aphasia  History is obtained from: EMS, chart review  HPI: Ralph [name] is a 70 y.o. male who has a past medical history of depression, diabetes, hypertension, hyperlipidemia, schizophrenia on chronic antipsychotics, dementia, lives at a facility and is a ward of state, brought in for evaluation from the facility after he sustained a fall. According to the facility information provided to EMS, has last known well was around 7:15 PM when he had a fall and hurt his right forearm with some scrapes.  Immediately after, he was unable to talk normally.  EMS was called and they noted some left-sided weakness initially. On route to the hospital, his weakness started to improve and so did his aphasia. Patient does remember the fall and says that he felt lightheaded and fell. There was no witnessed seizure activity.  He does not report blacking out.  Reviewing the notes from outpatient neurology-Dr. Karel Jarvis from February 2022-who had evaluated him for memory loss-he had MoCA score of 17/30, and nonfocal neurological exam with the exception of some endpoint tremors left greater than right, along with history of left radial nerve palsy in August 2021 which had resolved-requiring increased supervision and higher level of care and unable to take care of ADLs-secondary to cognitive changes as well as being on multiple psychoactive medications.   LKW: 7:15 PM tpa given?: no, nonfocal exam Premorbid modified Rankin scale (mRS):3  ROS: Performed and negative except as noted in HPI.  Past Medical History:  Diagnosis Date  . Allergic rhinitis   . Anxiety   . Arthritis    left hip per pt.  Marland Kitchen BPH (benign prostatic hyperplasia)   . Cataract    had surgery  . Chronic constipation   . Depression   . Diabetes mellitus without complication (HCC)    type 2  . Heart murmur    slight  .  Hyperlipidemia   . Hypertension   . Hypothyroidism   . Neuropathy   . Osteoarthritis   . Pancreatic insufficiency   . Peripheral arterial disease (HCC)   . Schizophrenia (HCC)    paranoid   . Thyroid disease    hypothyroid  . Urinary incontinence     Family History  Problem Relation Age of Onset  . Diabetes Mellitus II Mother   . Heart disease Father   . Lung cancer Father   . Diabetes Mellitus II Paternal Grandfather   . Diabetes Paternal Grandfather   . Diabetes Brother      Social History:   reports that he has quit smoking. He has never used smokeless tobacco. He reports that he does not drink alcohol and does not use drugs.  Medications  Current Facility-Administered Medications:  .  sodium chloride flush (NS) 0.9 % injection 3 mL, 3 mL, Intravenous, Once, Linwood Dibbles, MD  Current Outpatient Medications:  .  benztropine (COGENTIN) 0.5 MG tablet, Take 0.5 mg by mouth 2 (two) times daily., Disp: , Rfl:  .  buPROPion (WELLBUTRIN XL) 300 MG 24 hr tablet, Take 300 mg by mouth daily., Disp: , Rfl:  .  clonazePAM (KLONOPIN) 0.25 MG disintegrating tablet, Take 0.25 mg by mouth daily., Disp: , Rfl:  .  clonazePAM (KLONOPIN) 0.5 MG tablet, Take 0.5 mg by mouth at bedtime. , Disp: , Rfl:  .  divalproex (DEPAKOTE ER) 500 MG 24 hr tablet, Take 1,000 mg by mouth every evening. , Disp: , Rfl:  .  divalproex (DEPAKOTE) 500 MG DR tablet, Take 500 mg by mouth every morning., Disp: , Rfl:  .  Eyelid Cleansers (OCUSOFT LID SCRUB PLUS) PADS, Place 1 each into both eyes 2 (two) times daily., Disp: , Rfl:  .  insulin detemir (LEVEMIR) 100 UNIT/ML injection, Inject 15 Units into the skin at bedtime. , Disp: , Rfl:  .  levocetirizine (XYZAL) 5 MG tablet, Take 5 mg by mouth daily. , Disp: , Rfl:  .  levothyroxine (SYNTHROID) 150 MCG tablet, Take 150 mcg by mouth daily before breakfast., Disp: , Rfl:  .  metFORMIN (GLUCOPHAGE) 500 MG tablet, Take 1,000 mg by mouth in the morning and at bedtime.,  Disp: , Rfl:  .  mineral oil (FLEET OIL) enema, Place 1 enema rectally daily as needed for severe constipation., Disp: , Rfl:  .  Multiple Vitamins-Minerals (CERTAVITE/ANTIOXIDANTS) TABS, Take 1 tablet by mouth daily., Disp: , Rfl:  .  Nutritional Supplement LIQD, Take 1 Bottle by mouth with breakfast, with lunch, and with evening meal. Mighty Shakes, Disp: , Rfl:  .  Nutritional Supplements (GLUCOSE MANAGEMENT) TABS, Take 1 tablet by mouth as needed (blood sugar below 60)., Disp: , Rfl:  .  OLANZapine (ZYPREXA) 20 MG tablet, Take 20 mg by mouth at bedtime., Disp: , Rfl:  .  Pancrelipase, Lip-Prot-Amyl, 24000-76000 units CPEP, Take 3 capsules by mouth 3 (three) times daily with meals. , Disp: , Rfl:  .  Polyethyl Glycol-Propyl Glycol (SYSTANE ULTRA) 0.4-0.3 % SOLN, Place 1 drop into the left eye See admin instructions. Instill 1 drop into the left eye 4 to 6 times a day, Disp: , Rfl:  .  polyethylene glycol (MIRALAX / GLYCOLAX) 17 g packet, Take 17 g by mouth daily., Disp: , Rfl:  .  pregabalin (LYRICA) 75 MG capsule, Take 75 mg by mouth 2 (two) times daily. , Disp: , Rfl:  .  ramipril (ALTACE) 2.5 MG capsule, Take 5 mg by mouth daily., Disp: , Rfl:  .  simvastatin (ZOCOR) 40 MG tablet, Take 40 mg by mouth at bedtime., Disp: , Rfl:  .  tamsulosin (FLOMAX) 0.4 MG CAPS capsule, 1 capsule 30 minutes after breakfast each day, Disp: , Rfl:  .  Valbenazine Tosylate (INGREZZA) 40 MG CAPS, Take 40 mg by mouth at bedtime., Disp: , Rfl:  .  White Petrolatum-Mineral Oil (GENTEAL TEARS NIGHT-TIME) OINT, Apply 1 application to eye at bedtime. Pull down lower lid and apply a thin ribbon, Disp: , Rfl:  .  White Petrolatum-Mineral Oil (REFRESH P.M. OP), Place 1 application into the right eye every 2 (two) hours while awake., Disp: , Rfl:    Exam: Current vital signs: BP (!) 192/82   Pulse 74   Temp (!) 97.5 F (36.4 C) (Oral)   Resp 18   Ht 6' (1.829 m)   Wt 82.1 kg   SpO2 99%   BMI 24.55 kg/m  Vital  signs in last 24 hours: Temp:  [97.5 F (36.4 C)] 97.5 F (36.4 C) (03/25 2301) Pulse Rate:  [55-80] 74 (03/25 2330) Resp:  [11-18] 18 (03/25 2330) BP: (178-195)/(77-82) 192/82 (03/25 2330) SpO2:  [96 %-99 %] 99 % (03/25 2330) Weight:  [82.1 kg] 82.1 kg (03/25 2233) General: Appears poorly kempt and groomed, awake alert in no distress HEENT: Normocephalic, atraumatic, asymmetric palpable fissures right eye opening smaller than left.  Masked facies. Lungs: Clear Cardiovascular exam with regular rate rhythm and no murmurs Abdomen soft nondistended nontender Extremities warm well perfused with scrapes on  the right forearm. Neurological exam Awake alert oriented x3 Speech is mildly dysarthric No aphasia Is able to follow all commands Cranial nerve examination shows pupils equal round reactive to light, extraocular movements appear intact, visual fields full, his face is asymmetric but I cannot be sure which side is droopy-he uses a denture at baseline and has extremely poor oral hygiene and dentition.  Shoulder shrug intact, tongue and palate midline. Motor exam: He has no drift in any of the 4 extremities but says that he is always been somewhat weaker on the left side. No resting tremor-endpoint tremor as below.  Tone appears normal bilaterally. Sensory exam with intact touch all over Coordination: Endpoint tremor left greater than right without obvious dysmetria.  NIH stroke scale-1 for dysarthria  Labs I have reviewed labs in epic and the results pertinent to this consultation are:  CBC    Component Value Date/Time   WBC 7.9 11/20/2020 2228   RBC 3.12 (L) 11/20/2020 2228   HGB 9.5 (L) 11/20/2020 2235   HGB 12.5 (L) 11/03/2017 1128   HCT 28.0 (L) 11/20/2020 2235   HCT 37.6 11/03/2017 1128   PLT 212 11/20/2020 2228   PLT 263 11/03/2017 1128   MCV 96.2 11/20/2020 2228   MCV 90 11/03/2017 1128   MCH 31.4 11/20/2020 2228   MCHC 32.7 11/20/2020 2228   RDW 13.1 11/20/2020 2228    RDW 13.8 11/03/2017 1128   LYMPHSABS 1.7 11/20/2020 2228   LYMPHSABS 1.7 11/03/2017 1128   MONOABS 0.6 11/20/2020 2228   EOSABS 0.1 11/20/2020 2228   EOSABS 0.1 11/03/2017 1128   BASOSABS 0.1 11/20/2020 2228   BASOSABS 0.0 11/03/2017 1128    CMP     Component Value Date/Time   NA 141 11/20/2020 2235   NA 142 11/03/2017 1128   K 4.5 11/20/2020 2235   CL 106 11/20/2020 2235   CO2 26 11/20/2020 2228   GLUCOSE 102 (H) 11/20/2020 2235   BUN 26 (H) 11/20/2020 2235   BUN 16 11/03/2017 1128   CREATININE 1.50 (H) 11/20/2020 2235   CALCIUM 8.3 (L) 11/20/2020 2228   PROT 5.3 (L) 11/20/2020 2228   PROT 6.0 11/03/2017 1128   ALBUMIN 2.6 (L) 11/20/2020 2228   ALBUMIN 3.8 11/03/2017 1128   AST 18 11/20/2020 2228   ALT 11 11/20/2020 2228   ALKPHOS 56 11/20/2020 2228   BILITOT 0.5 11/20/2020 2228   BILITOT 0.3 11/03/2017 1128   GFRNONAA 49 (L) 11/20/2020 2228   GFRAA >60 12/12/2019 0951  Creatinine very mildly elevated from the prior value of 1.35 in April 2021.  Up until 2019, creatinine was normal.  Imaging I have reviewed the images obtained: CT-scan of the brain-aspects 10.  No bleed.  Moderately advanced cerebral atrophy.  Assessment:  70 year old with above past medical history who is on multiple psychoactive medications and multiple sedating medications along with cardiovascular risk factors, presented to the emergency room for evaluation of concern for an acute stroke after he sustained a fall and his speech was garbled and was noted to have some left-sided weakness. On my examination, other than mild dysarthria and his subjective sensation of some left arm weakness-that she says has been present for a long time, I did not appreciate any focal neurological deficit. He remembers the fall and did not blackout. I am unclear of the circumstances of the fall-appears to be a mechanical fall. His speech issue could have been a postconcussive phenomenon. At this point, given his risk  factors, and  an MRI is prudent to rule out an acute stroke. No reported seizures-he is on Depakote 1500 daily-likely as a part of his psychiatric medicine regimen. He has had multiple falls per history-I suspect that his psychoactive medications of started to cause some sort of drug-induced parkinsonism and this point as well leading to gait instability.  Impression: ?  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  Continue outpatient PT OT and outpatient neurology follow-up.  If the MRI is positive for stroke, he will need admission for stroke risk factor work-up. I will update the ED providers once the MRI brain is completed.  ADDENDUM -MRI brain completed and reviewed. No acute stroke.  -UA pending -Medical management per primary team - no further inpatient work up needed from a neurological standpoint. -f/u with outpatient neurology  Relayed plan to S. Upstill, PAC in the ER -- Milon DikesAshish Achol Azpeitia, MD Neurologist [facility] Pager: [phone]

## 2020-11-20 NOTE — ED Provider Notes (Signed)
[facility]ENCY [facility] Provider Note   CSN: [id number] Arrival date & time: 11/20/20  2226  An emergency department physician performed an initial assessment on this suspected stroke patient at 2228.  History Chief Complaint  Patient presents with  . Code Stroke    Ralph [name] is a 70 y.o. male with past medical history significant for anxiety, depression, type 2 diabetes, hypertension, PAD, schizophrenia, tardive dyskinesia, urinary incontinence.  Tetanus is up-to-date.  HPI Patient presents to emergency room today as a code stroke activated by EMS. LKW 1915. He is a resident at [facility]. Per EMS patient had a mechanical fall witnessed by his roommate who then went to get the facility staff.  He was laying face down.  He was alert and oriented with left-sided facial droop and left-sided weakness on their arrival.  On arrival to ED patient has clear speech and tells neurologist that he has history of left-sided weakness.  Patient tells me he is not in any pain.  He states he had a fall and does not remember what happened.  Level 5 caveat applies secondary to acuity of condition. I obtained additional history from staff at [facility]. She reports roommate said patient stood up to get up to walk to the bathroom.  He did not have his walker and fell landing face down on the floor.  When she arrived to the room he was still lying down on the floor unresponsive. He had to be shaken to wake up. Had slurred speech and was not at baseline. He had left sided facial droop. There was no seizure like activity.  He typically ambulates with a walker.  Chart review shows patient comments on having baseline tremors in extremities and left hand weakness when he was seen in the ED in September 2021.   Past Medical History:  Diagnosis Date  . Allergic rhinitis   . Anxiety   . Arthritis    left hip per pt.  Marland Kitchen. BPH (benign prostatic hyperplasia)   . Cataract     had surgery  . Chronic constipation   . Depression   . Diabetes mellitus without complication (HCC)    type 2  . Heart murmur    slight  . Hyperlipidemia   . Hypertension   . Hypothyroidism   . Neuropathy   . Osteoarthritis   . Pancreatic insufficiency   . Peripheral arterial disease (HCC)   . Schizophrenia (HCC)    paranoid   . Tardive dyskinesia   . Thyroid disease    hypothyroid  . Urinary incontinence     Patient Active Problem List   Diagnosis Date Noted  . Hyperplasia of prostate with urinary obstruction 12/12/2019  . Malnutrition of moderate degree 11/26/2016  . Septic arthritis of foot (HCC) 11/26/2016  . Open wound of left foot 11/25/2016  . Thyroid disease   . Hypertension   . Diabetes mellitus without complication (HCC)   . Depression   . BPH (benign prostatic hyperplasia)   . Anxiety     Past Surgical History:  Procedure Laterality Date  . AMPUTATION Left 11/26/2016   Procedure: RAY AMPUTATION LEFT FIFTH TOE;  Surgeon: Beverely LowSteve Norris, MD;  [facility];  Service: Orthopedics;  Laterality: Left;  . CATARACT EXTRACTION     Bil with implants  . COLONOSCOPY     per pt/ had 2 colon but not sure when.  . COLONOSCOPY    . EYE SURGERY  Bil radial orbital decompression surg / per pt eye socket surgery./ has had 10 surgeries  . Left [facility] PTS/Stent 2018- Dr Edilia Bo     . LOWER EXTREMITY ANGIOGRAPHY N/A 11/28/2016   Procedure: Lower Extremity Angiography;  Surgeon: Chuck Hint, MD;  Location[facility] INVASIVE CV LAB;  Service: Cardiovascular;  Laterality: N/A;  . PERIPHERAL VASCULAR INTERVENTION Left 11/28/2016   Procedure: Peripheral Vascular Intervention;  Surgeon: Chuck Hint, MD;  Location: [facility] INVASIVE CV LAB;  Service: Cardiovascular;  Laterality: Left;  common iliac  . thyroid ablation    . TONSILLECTOMY     as a child  . TRANSURETHRAL RESECTION OF PROSTATE N/A 12/12/2019   Procedure: TRANSURETHRAL RESECTION OF THE PROSTATE (TURP);  Surgeon:  Marcine Matar, MD;  Location: [facility] ORS;  Service: Urology;  Laterality: N/A;  37 MINS       Family History  Problem Relation Age of Onset  . Diabetes Mellitus II Mother   . Heart disease Father   . Lung cancer Father   . Diabetes Mellitus II Paternal Grandfather   . Diabetes Paternal Grandfather   . Diabetes Brother     Social History   Tobacco Use  . Smoking status: Former Games developer  . Smokeless tobacco: Never Used  Vaping Use  . Vaping Use: Never used  Substance Use Topics  . Alcohol use: No  . Drug use: No    Home Medications Prior to Admission medications   Medication Sig Start Date End Date Taking? Authorizing Provider  benztropine (COGENTIN) 0.5 MG tablet Take 0.5 mg by mouth 2 (two) times daily.   Yes [provider]  buPROPion (WELLBUTRIN XL) 300 MG 24 hr tablet Take 300 mg by mouth daily.   Yes [provider]  clonazePAM (KLONOPIN) 0.25 MG disintegrating tablet Take 0.25 mg by mouth daily.   Yes [provider]  clonazePAM (KLONOPIN) 0.5 MG tablet Take 0.5 mg by mouth at bedtime.    Yes [provider]  divalproex (DEPAKOTE ER) 500 MG 24 hr tablet Take 1,000 mg by mouth every evening.    Yes [provider]  divalproex (DEPAKOTE) 500 MG DR tablet Take 500 mg by mouth every morning.   Yes [provider]  Eyelid Cleansers (OCUSOFT LID SCRUB PLUS) PADS Place 1 each into both eyes 2 (two) times daily.   Yes [provider]  insulin detemir (LEVEMIR) 100 UNIT/ML injection Inject 15 Units into the skin at bedtime.    Yes [provider]  levocetirizine (XYZAL) 5 MG tablet Take 5 mg by mouth daily.    Yes [provider]  levothyroxine (SYNTHROID) 150 MCG tablet Take 150 mcg by mouth daily before breakfast.   Yes [provider]  metFORMIN (GLUCOPHAGE) 500 MG tablet Take 1,000 mg by mouth in the morning and at bedtime.   Yes [provider]  mineral oil (FLEET OIL) enema  Place 1 enema rectally daily as needed for severe constipation.   Yes [provider]  Multiple Vitamins-Minerals (CERTAVITE/ANTIOXIDANTS) TABS Take 1 tablet by mouth daily.   Yes [provider]  Nutritional Supplement LIQD Take 1 Bottle by mouth with breakfast, with lunch, and with evening meal. Mighty Shakes   Yes [provider]  Nutritional Supplements (GLUCOSE MANAGEMENT) TABS Take 1 tablet by mouth as needed (blood sugar below 60).   Yes [provider]  OLANZapine (ZYPREXA) 20 MG tablet Take 20 mg by mouth at bedtime.   Yes [provider]  Pancrelipase,  Lip-Prot-Amyl, 24000-76000 units CPEP Take 3 capsules by mouth 3 (three) times daily with meals.    Yes [provider]  Polyethyl Glycol-Propyl Glycol (SYSTANE ULTRA) 0.4-0.3 % SOLN Place 1 drop into the left eye See admin instructions. Instill 1 drop into the left eye 4 to 6 times a day   Yes [provider]  polyethylene glycol (MIRALAX / GLYCOLAX) 17 g packet Take 17 g by mouth daily.   Yes [provider]  pregabalin (LYRICA) 75 MG capsule Take 75 mg by mouth 2 (two) times daily.    Yes [provider]  ramipril (ALTACE) 2.5 MG capsule Take 5 mg by mouth daily.   Yes [provider]  simvastatin (ZOCOR) 40 MG tablet Take 40 mg by mouth at bedtime.   Yes [provider]  tamsulosin (FLOMAX) 0.4 MG CAPS capsule Take 0.4 mg by mouth daily. 05/23/19  Yes [provider]  Valbenazine Tosylate (INGREZZA) 40 MG CAPS Take 40 mg by mouth at bedtime.   Yes [provider]  White Petrolatum-Mineral Oil (GENTEAL TEARS NIGHT-TIME) OINT Apply 1 application to eye at bedtime. Pull down lower lid and apply a thin ribbon   Yes [provider]  White Petrolatum-Mineral Oil (REFRESH P.M. OP) Place 1 application into the right eye every 2 (two) hours while awake.   Yes [provider]    Allergies    Antihistamines,  chlorpheniramine-type; Antihistamines, diphenhydramine-type; Antihistamines, loratadine-type; and Ethanolamine  Review of Systems   Review of Systems All other systems are reviewed and are negative for acute change except as noted in the HPI.  Physical Exam Updated Vital Signs BP (!) 192/82   Pulse 74   Temp (!) 97.5 F (36.4 C) (Oral)   Resp 18   Ht 6' (1.829 m)   Wt 82.1 kg   SpO2 99%   BMI 24.55 kg/m   Physical Exam Vitals and nursing note reviewed.  Constitutional:      General: He is not in acute distress.    Appearance: He is not ill-appearing.  HENT:     Head: Normocephalic and atraumatic. No raccoon eyes or Battle's sign.     Jaw: There is normal jaw occlusion.     Right Ear: Tympanic membrane and external ear normal. No hemotympanum.     Left Ear: Tympanic membrane and external ear normal. No hemotympanum.     Nose: Nose normal.     Right Nostril: No epistaxis or septal hematoma.     Left Nostril: No epistaxis or septal hematoma.     Mouth/Throat:     Mouth: Mucous membranes are dry.     Pharynx: Oropharynx is clear.  Eyes:     General: No scleral icterus.       Right eye: No discharge.        Left eye: No discharge.     Extraocular Movements: Extraocular movements intact.     Conjunctiva/sclera: Conjunctivae normal.     Pupils: Pupils are equal, round, and reactive to light.  Neck:     Vascular: No JVD.  Cardiovascular:     Rate and Rhythm: Normal rate and regular rhythm.     Pulses: Normal pulses.          Radial pulses are 2+ on the right side and 2+ on the left side.     Heart sounds: Normal heart sounds.  Pulmonary:     Comments: Lungs clear to auscultation in all fields. Symmetric chest rise. No wheezing,  rales, or rhonchi. Abdominal:     Comments: Abdomen is soft, non-distended, and non-tender in all quadrants. No rigidity, no guarding. No peritoneal signs.  Musculoskeletal:        General: Normal range of motion.     Cervical back: Normal range  of motion.     Comments: Able to move all extremities without pain.  No obvious deformity.  Skin:    General: Skin is warm and dry.     Capillary Refill: Capillary refill takes less than 2 seconds.     Comments: 1 cm skin tear on anterior right forearm.  No active bleeding.  Neurological:     Mental Status: He is oriented to person, place, and time.     GCS: GCS eye subscore is 4. GCS verbal subscore is 5. GCS motor subscore is 6.     Comments: No aphasia. Speech is mildly dysarthric He is able to follow all commands Cranial nerve examination shows pupils equal round reactive to light, extraocular movements appear intact, visual fields full. CN 2-12 intact. Shoulder shrug intact, tongue and palate midline. No drift seen in extremities. He has baseline tremor, unchanged per patient. Left upper extremity grip strength 4/5 compared to right upper extremity. Equal strength in bilateral lower extremities. Tone appears normal bilaterally. Sensory exam with intact touch all over  Psychiatric:        Behavior: Behavior normal.     ED Results / Procedures / Treatments   Labs (all labs ordered are listed, but only abnormal results are displayed) Labs Reviewed  CBC - Abnormal; Notable for the following components:      Result Value   RBC 3.12 (*)    Hemoglobin 9.8 (*)    HCT 30.0 (*)    All other components within normal limits  COMPREHENSIVE METABOLIC PANEL - Abnormal; Notable for the following components:   Glucose, Bld 107 (*)    BUN 24 (*)    Creatinine, Ser 1.53 (*)    Calcium 8.3 (*)    Total Protein 5.3 (*)    Albumin 2.6 (*)    GFR, Estimated 49 (*)    Anion gap 4 (*)    All other components within normal limits  I-STAT CHEM 8, ED - Abnormal; Notable for the following components:   BUN 26 (*)    Creatinine, Ser 1.50 (*)    Glucose, Bld 102 (*)    Calcium, Ion 1.14 (*)    Hemoglobin 9.5 (*)    HCT 28.0 (*)    All other components within normal limits  URINE CULTURE   PROTIME-INR  APTT  DIFFERENTIAL  URINALYSIS, ROUTINE W REFLEX MICROSCOPIC  CBG MONITORING, ED    EKG EKG Interpretation  Date/Time:  Friday November 20 2020 22:44:25 EDT Ventricular Rate:  76 PR Interval:    QRS Duration: 104 QT Interval:  413 QTC Calculation: 465 R Axis:   -14 Text Interpretation: Sinus rhythm Probable left atrial enlargement Left ventricular hypertrophy ST elevation, consider anterior injury , more prominent compared to prior ecg Confirmed by Linwood Dibbles [phone]) on 11/20/2020 10:50:45 PM   Radiology CT HEAD CODE STROKE WO CONTRAST  Result Date: 11/20/2020 CLINICAL DATA:  Code stroke. Initial evaluation for acute aphasia, left-sided facial droop. EXAM: CT HEAD WITHOUT CONTRAST TECHNIQUE: Contiguous axial images were obtained from the base of the skull through the vertex without intravenous contrast. COMPARISON:  Prior MRI from 11/02/2020 FINDINGS: Brain: Moderately advanced age-related cerebral atrophy. No acute intracranial hemorrhage. No acute large vessel  territory infarct. No mass lesion, midline shift or mass effect. No hydrocephalus or extra-axial fluid collection. Vascular: No hyperdense vessel. Calcified atherosclerosis present at skull base. Skull: Scalp soft tissues demonstrate no acute finding. Probable notochordal remnant again noted at the dorsal clivus. Calvarium otherwise intact. Sinuses/Orbits: Globes and orbital soft tissues demonstrate no acute finding. Chronic ethmoidal and maxillary sinus disease noted. No significant mastoid effusion. Other: None. ASPECTS [facility] Stroke Program Early CT Score) - Ganglionic level infarction (caudate, lentiform nuclei, internal capsule, insula, M1-M3 cortex): 7 - Supraganglionic infarction (M4-M6 cortex): 3 Total score (0-10 with 10 being normal): 10 IMPRESSION: 1. No acute intracranial infarct or other abnormality. 2. ASPECTS is 10. 3. Moderately advanced cerebral atrophy. These results were communicated to Dr. Wilford Corner at  10:45 pmon 3/25/2022by text page via the [facility] messaging system. Electronically Signed   By: Rise Mu M.D.   On: 11/20/2020 22:48    Procedures Procedures   Medications Ordered in ED Medications  sodium chloride flush (NS) 0.9 % injection 3 mL (has no administration in time range)  sodium chloride 0.9 % bolus 500 mL (has no administration in time range)    ED Course  I have reviewed the triage vital signs and the nursing notes.  Pertinent labs & imaging results that were available during my care of the patient were reviewed by me and considered in my medical decision making (see chart for details).    MDM Rules/Calculators/A&P                          History provided by mostly by EMS and staff at facility with additional history obtained from chart review. Patient poor historian.  Presenting as a code stroke.  Seen by neurologist Dr. Wilford Corner at the bridge.  Glucose 95 on ED arrival.  Immediately to CT scanner after airway was cleared.  CT head without signs of stroke, no traumatic findings.  On my exam patient appears to be back to baseline.  He has no facial droop and speech is clear.  He has superficial skin tear on right arm that were cleaned and Steri-Strips applied.  Tetanus is up-to-date. CBC without leukocytosis, hemoglobin 9.8.  Baseline appears to be around 10.  INR normal.  PTT normal.  CMP shows kidney function consistent with baseline.  No significant electrolyte derangement.  He has a history of urinary tract infections, UA and culture added to work-up.  EKG without ischemic changes. Discussed results with neurologist who is recommending further evaluation with MR brain.  Patient care transferred to S. Upstill PA-C at the end of my shift pending MRI and re-consult to neuro. Patient presentation, ED course, and plan of care discussed with review of all pertinent labs and imaging. Please see her note for further details regarding further ED course and  disposition.   Portions of this note were generated with Scientist, clinical (histocompatibility and immunogenetics). Dictation errors may occur despite best attempts at proofreading.  Final Clinical Impression(s) / ED Diagnoses Final diagnoses:  None    Rx / DC Orders ED Discharge Orders    None       Kandice Hams 11/21/20 Merri Brunette, MD 11/24/20 [phone]

## 2020-11-20 NOTE — ED Notes (Signed)
Pt to MRI

## 2020-11-20 NOTE — ED Notes (Signed)
[phone] Cheri guardianship social worker

## 2020-11-20 NOTE — Code Documentation (Signed)
Stroke Response Nurse Documentation Code Documentation  Ralph [name] is a 70 y.o. male arriving to [facility] ED via [facility] on 3/25 with past medical hx of DM2, HLD, HTN, schizophrenia. Code stroke was activated by EMS. Patient from SNF where he was LKW at [phone] and now complaining of left facial droop, left sided weakness . On No antithrombotic. Stroke team at the bedside on patient arrival. Labs drawn and patient cleared for CT by Dr. Liston Alba. Patient to CT with team. NIHSS 1, see documentation for details and code stroke times. Patient with dysarthria  on exam. The following imaging was completed:  CTH, MRI. Patient is not a candidate for tPA due to nondisabling deficit. Bedside handoff with ED RN Durward Mallard.    Rose Fillers  Rapid Response RN

## 2020-11-21 ENCOUNTER — Emergency Department (HOSPITAL_COMMUNITY): Payer: 59

## 2020-11-21 DIAGNOSIS — I639 Cerebral infarction, unspecified: Secondary | ICD-10-CM | POA: Diagnosis not present

## 2020-11-21 LAB — URINALYSIS, ROUTINE W REFLEX MICROSCOPIC
Bacteria, UA: NONE SEEN
Bilirubin Urine: NEGATIVE
Glucose, UA: NEGATIVE mg/dL
Hgb urine dipstick: NEGATIVE
Ketones, ur: NEGATIVE mg/dL
Leukocytes,Ua: NEGATIVE
Nitrite: NEGATIVE
Protein, ur: 100 mg/dL — AB
Specific Gravity, Urine: 1.006 (ref 1.005–1.030)
pH: 6 (ref 5.0–8.0)

## 2020-11-21 NOTE — ED Notes (Signed)
[phone] [facility] - admin [facility] - [phone]

## 2020-11-21 NOTE — ED Notes (Signed)
Pt not in room for VS reassessment. 

## 2020-11-21 NOTE — Discharge Instructions (Addendum)
The evaluation in the emergency department did not identify any new stroke or serious medical condition. Continue regular medications and follow up with your doctor as needed.   Return to the emergency department with any new or emergency medical concerns.

## 2020-11-21 NOTE — ED Notes (Signed)
PTAR CALLED  °

## 2020-11-21 NOTE — ED Notes (Signed)
Back from MRI.

## 2020-11-21 NOTE — ED Notes (Signed)
PTAR at bedside. Report given to [facility] personnel.

## 2020-11-22 LAB — URINE CULTURE: Culture: NO GROWTH

## 2020-11-23 ENCOUNTER — Telehealth: Payer: Self-pay

## 2020-11-23 NOTE — Telephone Encounter (Signed)
Ralph [name] with [facility] called wanting verbal orders for one time visit for a discharge visit call back at [phone]

## 2020-11-23 NOTE — Telephone Encounter (Signed)
That is fine, thanks 

## 2020-11-24 NOTE — Telephone Encounter (Signed)
Spoke with therapist Melissa Montane gave him verbal orders for discharge visit

## 2020-12-14 ENCOUNTER — Telehealth: Payer: Self-pay | Admitting: Neurology

## 2020-12-14 DIAGNOSIS — G629 Polyneuropathy, unspecified: Secondary | ICD-10-CM

## 2020-12-14 DIAGNOSIS — R296 Repeated falls: Secondary | ICD-10-CM

## 2020-12-14 NOTE — Telephone Encounter (Signed)
New order sent.

## 2020-12-14 NOTE — Telephone Encounter (Signed)
Ok to send order, can use diagnosis of neuropathy, dementia, osteoarthritis. Thanks

## 2020-12-14 NOTE — Addendum Note (Signed)
Addended by: Leida Lauth on: 12/14/2020 02:02 PM   Modules accepted: Orders

## 2020-12-21 ENCOUNTER — Telehealth: Payer: Self-pay

## 2020-12-21 NOTE — Telephone Encounter (Signed)
Melissa Elrod OT called no answer left a voice mail  given verbal orders for OT 1 time a week for 8 weeks, for safe care and strenthing,

## 2020-12-24 ENCOUNTER — Encounter (HOSPITAL_COMMUNITY): Payer: Self-pay

## 2020-12-24 ENCOUNTER — Emergency Department (HOSPITAL_COMMUNITY)
Admission: EM | Admit: 2020-12-24 | Discharge: 2020-12-24 | Disposition: A | Discharge status: Home / Self Care | Payer: 59 | Attending: Emergency Medicine | Admitting: Emergency Medicine

## 2020-12-24 DIAGNOSIS — Z7984 Long term (current) use of oral hypoglycemic drugs: Secondary | ICD-10-CM | POA: Insufficient documentation

## 2020-12-24 DIAGNOSIS — Z79899 Other long term (current) drug therapy: Secondary | ICD-10-CM | POA: Insufficient documentation

## 2020-12-24 DIAGNOSIS — Z794 Long term (current) use of insulin: Secondary | ICD-10-CM | POA: Insufficient documentation

## 2020-12-24 DIAGNOSIS — R030 Elevated blood-pressure reading, without diagnosis of hypertension: Secondary | ICD-10-CM | POA: Diagnosis present

## 2020-12-24 DIAGNOSIS — Z87891 Personal history of nicotine dependence: Secondary | ICD-10-CM | POA: Diagnosis not present

## 2020-12-24 DIAGNOSIS — E119 Type 2 diabetes mellitus without complications: Secondary | ICD-10-CM | POA: Diagnosis not present

## 2020-12-24 DIAGNOSIS — Z89422 Acquired absence of other left toe(s): Secondary | ICD-10-CM | POA: Diagnosis not present

## 2020-12-24 DIAGNOSIS — E039 Hypothyroidism, unspecified: Secondary | ICD-10-CM | POA: Diagnosis not present

## 2020-12-24 DIAGNOSIS — I10 Essential (primary) hypertension: Secondary | ICD-10-CM | POA: Diagnosis not present

## 2020-12-24 MED ORDER — AMLODIPINE BESYLATE 5 MG PO TABS: 5.0000 mg | ORAL_TABLET | Freq: Every day | ORAL | 0 refills | Status: AC

## 2020-12-24 MED ORDER — AMLODIPINE BESYLATE 5 MG PO TABS
5.0000 mg | ORAL_TABLET | Freq: Once | ORAL | Status: AC
  Administered 2020-12-24: 5 mg via ORAL
  Filled 2020-12-24: qty 1

## 2020-12-24 NOTE — ED Notes (Signed)
Pt medically cleared for discharge by ED provider. This nurse spoke with Lissa Merlin, pt's legal gaurdian. Per Lissa Merlin, pt's facility, [facility], to set-up transport from ED back to [facility]. Cheri Stinson to set this up and return call to this nurse.

## 2020-12-24 NOTE — ED Triage Notes (Signed)
Pt presents via EMS from [facility] for HTN prior to PT. Per EMS, pt's systolic HTN was in the 200's prior to PT starting and EMS was called. Pt has a known hx of HTN and states he took his hypertension medications this AM. Pt denies CP, SOB, HA, dizziness, abd pain, N/V.

## 2020-12-24 NOTE — ED Notes (Signed)
Patient assisted to the restroom 

## 2020-12-24 NOTE — ED Provider Notes (Signed)
[facility]-EMERGENCY DEPT Provider Note   CSN: [id number] Arrival date & time: 12/24/20  [id number]     History Chief Complaint  Patient presents with  . Hypertension    Hypertensive prior to PT this morning, 200's systolic.     Ralph KatzJohn G [name] is a 70 y.o. male.  The history is provided by the patient.  Hypertension This is a new problem. The current episode started more than 1 week ago. The problem occurs daily. The problem has not changed since onset.Pertinent negatives include no chest pain, no abdominal pain, no headaches and no shortness of breath. Nothing aggravates the symptoms. Nothing relieves the symptoms. He has tried nothing for the symptoms. The treatment provided no relief.       Past Medical History:  Diagnosis Date  . Allergic rhinitis   . Anxiety   . Arthritis    left hip per pt.  Marland Kitchen. BPH (benign prostatic hyperplasia)   . Cataract    had surgery  . Chronic constipation   . Depression   . Diabetes mellitus without complication (HCC)    type 2  . Heart murmur    slight  . Hyperlipidemia   . Hypertension   . Hypothyroidism   . Neuropathy   . Osteoarthritis   . Pancreatic insufficiency   . Peripheral arterial disease (HCC)   . Schizophrenia (HCC)    paranoid   . Tardive dyskinesia   . Thyroid disease    hypothyroid  . Urinary incontinence     Patient Active Problem List   Diagnosis Date Noted  . Hyperplasia of prostate with urinary obstruction 12/12/2019  . Malnutrition of moderate degree 11/26/2016  . Septic arthritis of foot (HCC) 11/26/2016  . Open wound of left foot 11/25/2016  . Thyroid disease   . Hypertension   . Diabetes mellitus without complication (HCC)   . Depression   . BPH (benign prostatic hyperplasia)   . Anxiety     Past Surgical History:  Procedure Laterality Date  . AMPUTATION Left 11/26/2016   Procedure: RAY AMPUTATION LEFT FIFTH TOE;  Surgeon: Beverely LowSteve Norris, MD;  [facility];  Service: Orthopedics;   Laterality: Left;  . CATARACT EXTRACTION     Bil with implants  . COLONOSCOPY     per pt/ had 2 colon but not sure when.  . COLONOSCOPY    . EYE SURGERY     Bil radial orbital decompression surg / per pt eye socket surgery./ has had 10 surgeries  . Left [facility] PTS/Stent 2018- Dr Edilia Boickson     . LOWER EXTREMITY ANGIOGRAPHY N/A 11/28/2016   Procedure: Lower Extremity Angiography;  Surgeon: Chuck Hinthristopher S Dickson, MD;  [facility];  Service: Cardiovascular;  Laterality: N/A;  . PERIPHERAL VASCULAR INTERVENTION Left 11/28/2016   Procedure: Peripheral Vascular Intervention;  Surgeon: Chuck Hinthristopher S Dickson, MD;  [facility];  Service: Cardiovascular;  Laterality: Left;  common iliac  . thyroid ablation    . TONSILLECTOMY     as a child  . TRANSURETHRAL RESECTION OF PROSTATE N/A 12/12/2019   Procedure: TRANSURETHRAL RESECTION OF THE PROSTATE (TURP);  Surgeon: Marcine Matarahlstedt, Stephen, MD;  Location: [facility] ORS;  Service: Urology;  Laterality: N/A;  7875 MINS       Family History  Problem Relation Age of Onset  . Diabetes Mellitus II Mother   . Heart disease Father   . Lung cancer Father   . Diabetes Mellitus II Paternal Grandfather   . Diabetes  Paternal Grandfather   . Diabetes Brother     Social History   Tobacco Use  . Smoking status: Former Games developer  . Smokeless tobacco: Never Used  Vaping Use  . Vaping Use: Never used  Substance Use Topics  . Alcohol use: No  . Drug use: No    Home Medications Prior to Admission medications   Medication Sig Start Date End Date Taking? Authorizing Provider  amLODipine (NORVASC) 5 MG tablet Take 1 tablet (5 mg total) by mouth daily. 12/24/20 01/23/21 Yes Aryanne Gilleland, DO  benztropine (COGENTIN) 0.5 MG tablet Take 0.5 mg by mouth 2 (two) times daily.    [provider]  buPROPion (WELLBUTRIN XL) 300 MG 24 hr tablet Take 300 mg by mouth daily.    [provider]  clonazePAM (KLONOPIN) 0.25 MG disintegrating  tablet Take 0.25 mg by mouth daily.    [provider]  clonazePAM (KLONOPIN) 0.5 MG tablet Take 0.5 mg by mouth at bedtime.     [provider]  divalproex (DEPAKOTE ER) 500 MG 24 hr tablet Take 1,000 mg by mouth every evening.     [provider]  divalproex (DEPAKOTE) 500 MG DR tablet Take 500 mg by mouth every morning.    [provider]  Eyelid Cleansers (OCUSOFT LID SCRUB PLUS) PADS Place 1 each into both eyes 2 (two) times daily.    [provider]  insulin detemir (LEVEMIR) 100 UNIT/ML injection Inject 15 Units into the skin at bedtime.     [provider]  levocetirizine (XYZAL) 5 MG tablet Take 5 mg by mouth daily.     [provider]  levothyroxine (SYNTHROID) 150 MCG tablet Take 150 mcg by mouth daily before breakfast.    [provider]  metFORMIN (GLUCOPHAGE) 500 MG tablet Take 1,000 mg by mouth in the morning and at bedtime.    [provider]  mineral oil (FLEET OIL) enema Place 1 enema rectally daily as needed for severe constipation.    [provider]  Multiple Vitamins-Minerals (CERTAVITE/ANTIOXIDANTS) TABS Take 1 tablet by mouth daily.    [provider]  Nutritional Supplement LIQD Take 1 Bottle by mouth with breakfast, with lunch, and with evening meal. Mighty Shakes    [provider]  Nutritional Supplements (GLUCOSE MANAGEMENT) TABS Take 1 tablet by mouth as needed (blood sugar below 60).    [provider]  OLANZapine (ZYPREXA) 20 MG tablet Take 20 mg by mouth at bedtime.    [provider]  Pancrelipase, Lip-Prot-Amyl, 24000-76000 units CPEP Take 3 capsules by mouth 3 (three) times daily with meals.     [provider]  Polyethyl Glycol-Propyl Glycol (SYSTANE ULTRA) 0.4-0.3 % SOLN Place 1 drop into the left eye See admin instructions. Instill 1 drop into the left eye 4 to 6 times a day    [provider]  polyethylene glycol  (MIRALAX / GLYCOLAX) 17 g packet Take 17 g by mouth daily.    [provider]  pregabalin (LYRICA) 75 MG capsule Take 75 mg by mouth 2 (two) times daily.     [provider]  ramipril (ALTACE) 2.5 MG capsule Take 5 mg by mouth daily.    [provider]  simvastatin (ZOCOR) 40 MG tablet Take 40 mg by mouth at bedtime.    [provider]  tamsulosin (FLOMAX) 0.4 MG CAPS capsule Take 0.4 mg by mouth daily. 05/23/19   [provider]  Valbenazine Tosylate (INGREZZA) 40 MG  CAPS Take 40 mg by mouth at bedtime.    [provider]  White Petrolatum-Mineral Oil (GENTEAL TEARS NIGHT-TIME) OINT Apply 1 application to eye at bedtime. Pull down lower lid and apply a thin ribbon    [provider]  White Petrolatum-Mineral Oil (REFRESH P.M. OP) Place 1 application into the right eye every 2 (two) hours while awake.    [provider]    Allergies    Antihistamines, chlorpheniramine-type; Antihistamines, diphenhydramine-type; Antihistamines, loratadine-type; and Ethanolamine  Review of Systems   Review of Systems  Constitutional: Negative for chills and fever.  HENT: Negative for ear pain and sore throat.   Eyes: Negative for pain and visual disturbance.  Respiratory: Negative for cough and shortness of breath.   Cardiovascular: Negative for chest pain and palpitations.  Gastrointestinal: Negative for abdominal pain and vomiting.  Genitourinary: Negative for dysuria and hematuria.  Musculoskeletal: Negative for arthralgias and back pain.  Skin: Negative for color change and rash.  Neurological: Negative for seizures, syncope and headaches.  All other systems reviewed and are negative.   Physical Exam Updated Vital Signs BP (!) 177/86   Pulse 92   Temp (!) 97.5 F (36.4 C) (Oral)   Ht 6' (1.829 m)   Wt 81.6 kg   SpO2 100%   BMI 24.41 kg/m   Physical Exam Vitals and nursing note reviewed.  Constitutional:       Appearance: He is well-developed.  HENT:     Head: Normocephalic and atraumatic.  Eyes:     Extraocular Movements: Extraocular movements intact.     Conjunctiva/sclera: Conjunctivae normal.     Pupils: Pupils are equal, round, and reactive to light.  Cardiovascular:     Rate and Rhythm: Normal rate and regular rhythm.     Pulses: Normal pulses.     Heart sounds: Normal heart sounds. No murmur heard.   Pulmonary:     Effort: Pulmonary effort is normal. No respiratory distress.     Breath sounds: Normal breath sounds.  Abdominal:     Palpations: Abdomen is soft.     Tenderness: There is no abdominal tenderness.  Musculoskeletal:     Cervical back: Normal range of motion and neck supple.  Skin:    General: Skin is warm and dry.  Neurological:     General: No focal deficit present.     Mental Status: He is alert and oriented to person, place, and time.     Cranial Nerves: No cranial nerve deficit.     Sensory: No sensory deficit.     Motor: No weakness.     Coordination: Coordination normal.     ED Results / Procedures / Treatments   Labs (all labs ordered are listed, but only abnormal results are displayed) Labs Reviewed - No data to display  EKG None  Radiology No results found.  Procedures Procedures   Medications Ordered in ED Medications  amLODipine (NORVASC) tablet 5 mg (5 mg Oral Given 12/24/20 1032)    ED Course  I have reviewed the triage vital signs and the nursing notes.  Pertinent labs & imaging results that were available during my care of the patient were reviewed by me and considered in my medical decision making (see chart for details).    MDM Rules/Calculators/A&P                          Ralph [name] is here with high blood pressure.  Blood  pressure 177/86.  No chest pain, no stroke symptoms.  Overall asymptomatic hypertension.  Sent here from nursing facility due to high blood pressure.  Overall we will start him on amlodipine as blood  pressure has been high recently.  We will have him record his blood pressure daily and have him follow-up with primary care doctor for any further adjustments.  Overall he is asymptomatic and appears well.  Neurologically intact.  No signs of volume overload.  No concern for ACS or stroke.  Discharged in good condition.  Started on amlodipine.  This chart was dictated using voice recognition software.  Despite best efforts to proofread,  errors can occur which can change the documentation meaning.   Final Clinical Impression(s) / ED Diagnoses Final diagnoses:  Hypertension, unspecified type    Rx / DC Orders ED Discharge Orders         Ordered    amLODipine (NORVASC) 5 MG tablet  Daily        12/24/20 1032           [place], DO 12/24/20 1035

## 2020-12-24 NOTE — ED Notes (Signed)
Provider at bedside to evaluate.

## 2020-12-24 NOTE — Discharge Instructions (Signed)
Please start amlodipine for blood pressure.  Check blood pressure daily and record it and have patient follow-up closely with primary care doctor.  Unless patient is having severe chest pain or stroke symptoms it is likely okay for him not to come to the ED solely for high blood pressure.

## 2020-12-24 NOTE — ED Notes (Signed)
Spoke with Lissa Merlin, pt's facility to have transport to pick-up patient from ED at 11:30am this morning.

## 2020-12-24 NOTE — ED Notes (Signed)
Provider declines EKG at this time.

## 2020-12-24 NOTE — ED Notes (Signed)
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

## 2021-05-07 ENCOUNTER — Ambulatory Visit: Payer: 59 | Admitting: Neurology

## 2024-05-29 DEATH — deceased
